# Patient Record
Sex: Female | Born: 1970 | Race: White | Hispanic: No | Marital: Married | Smoking: Never smoker
Health system: Southern US, Community
[De-identification: ages and names within clinical notes are randomized; demographics above are authoritative.]

## PROBLEM LIST (undated history)

## (undated) DIAGNOSIS — K219 Gastro-esophageal reflux disease without esophagitis: Secondary | ICD-10-CM

## (undated) DIAGNOSIS — M549 Dorsalgia, unspecified: Secondary | ICD-10-CM

## (undated) DIAGNOSIS — J069 Acute upper respiratory infection, unspecified: Secondary | ICD-10-CM

## (undated) DIAGNOSIS — E538 Deficiency of other specified B group vitamins: Secondary | ICD-10-CM

## (undated) DIAGNOSIS — E559 Vitamin D deficiency, unspecified: Secondary | ICD-10-CM

## (undated) DIAGNOSIS — R002 Palpitations: Secondary | ICD-10-CM

## (undated) DIAGNOSIS — I493 Ventricular premature depolarization: Secondary | ICD-10-CM

## (undated) DIAGNOSIS — C4491 Basal cell carcinoma of skin, unspecified: Secondary | ICD-10-CM

## (undated) DIAGNOSIS — D649 Anemia, unspecified: Secondary | ICD-10-CM

## (undated) DIAGNOSIS — F411 Generalized anxiety disorder: Secondary | ICD-10-CM

## (undated) DIAGNOSIS — M1990 Unspecified osteoarthritis, unspecified site: Secondary | ICD-10-CM

## (undated) DIAGNOSIS — K5900 Constipation, unspecified: Secondary | ICD-10-CM

## (undated) DIAGNOSIS — M2550 Pain in unspecified joint: Secondary | ICD-10-CM

## (undated) DIAGNOSIS — I10 Essential (primary) hypertension: Secondary | ICD-10-CM

## (undated) DIAGNOSIS — J309 Allergic rhinitis, unspecified: Secondary | ICD-10-CM

## (undated) DIAGNOSIS — E785 Hyperlipidemia, unspecified: Secondary | ICD-10-CM

## (undated) HISTORY — DX: Allergic rhinitis, unspecified: J30.9

## (undated) HISTORY — DX: Dorsalgia, unspecified: M54.9

## (undated) HISTORY — DX: Essential (primary) hypertension: I10

## (undated) HISTORY — PX: MOUTH SURGERY: SHX715

## (undated) HISTORY — DX: Unspecified osteoarthritis, unspecified site: M19.90

## (undated) HISTORY — DX: Vitamin D deficiency, unspecified: E55.9

## (undated) HISTORY — DX: Deficiency of other specified B group vitamins: E53.8

## (undated) HISTORY — DX: Constipation, unspecified: K59.00

## (undated) HISTORY — DX: Gastro-esophageal reflux disease without esophagitis: K21.9

## (undated) HISTORY — DX: Basal cell carcinoma of skin, unspecified: C44.91

## (undated) HISTORY — DX: Ventricular premature depolarization: I49.3

## (undated) HISTORY — DX: Palpitations: R00.2

## (undated) HISTORY — DX: Hyperlipidemia, unspecified: E78.5

## (undated) HISTORY — DX: Acute upper respiratory infection, unspecified: J06.9

## (undated) HISTORY — DX: Anemia, unspecified: D64.9

## (undated) HISTORY — DX: Pain in unspecified joint: M25.50

## (undated) HISTORY — DX: Generalized anxiety disorder: F41.1

---
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
## 1997-07-12 ENCOUNTER — Other Ambulatory Visit: Admission: RE | Admit: 1997-07-12 | Discharge: 1997-07-12 | Payer: Self-pay | Admitting: Obstetrics & Gynecology

## 1997-07-12 ENCOUNTER — Other Ambulatory Visit: Admission: RE | Admit: 1997-07-12 | Discharge: 1997-07-12 | Payer: Self-pay | Admitting: Gynecology

## 1997-08-03 ENCOUNTER — Encounter: Admission: RE | Admit: 1997-08-03 | Discharge: 1997-11-01 | Payer: Self-pay | Admitting: Internal Medicine

## 1998-07-17 ENCOUNTER — Other Ambulatory Visit: Admission: RE | Admit: 1998-07-17 | Discharge: 1998-07-17 | Payer: Self-pay | Admitting: Obstetrics & Gynecology

## 1998-10-31 ENCOUNTER — Other Ambulatory Visit: Admission: RE | Admit: 1998-10-31 | Discharge: 1998-10-31 | Payer: Self-pay | Admitting: Obstetrics & Gynecology

## 1999-07-10 ENCOUNTER — Other Ambulatory Visit: Admission: RE | Admit: 1999-07-10 | Discharge: 1999-07-10 | Payer: Self-pay | Admitting: Obstetrics & Gynecology

## 2000-03-09 ENCOUNTER — Inpatient Hospital Stay (HOSPITAL_COMMUNITY): Admission: AD | Admit: 2000-03-09 | Discharge: 2000-03-09 | Payer: Self-pay | Admitting: Family Medicine

## 2000-03-10 ENCOUNTER — Inpatient Hospital Stay (HOSPITAL_COMMUNITY): Admission: AD | Admit: 2000-03-10 | Discharge: 2000-03-12 | Payer: Self-pay | Admitting: Obstetrics & Gynecology

## 2000-03-10 ENCOUNTER — Encounter (INDEPENDENT_AMBULATORY_CARE_PROVIDER_SITE_OTHER): Payer: Self-pay

## 2000-03-13 ENCOUNTER — Encounter: Admission: RE | Admit: 2000-03-13 | Discharge: 2000-06-11 | Payer: Self-pay | Admitting: Obstetrics & Gynecology

## 2000-07-11 ENCOUNTER — Other Ambulatory Visit: Admission: RE | Admit: 2000-07-11 | Discharge: 2000-07-11 | Payer: Self-pay | Admitting: Obstetrics & Gynecology

## 2001-07-17 ENCOUNTER — Other Ambulatory Visit: Admission: RE | Admit: 2001-07-17 | Discharge: 2001-07-17 | Payer: Self-pay | Admitting: Obstetrics & Gynecology

## 2002-07-25 ENCOUNTER — Inpatient Hospital Stay (HOSPITAL_COMMUNITY): Admission: AD | Admit: 2002-07-25 | Discharge: 2002-07-25 | Payer: Self-pay | Admitting: Obstetrics & Gynecology

## 2002-10-06 ENCOUNTER — Inpatient Hospital Stay (HOSPITAL_COMMUNITY): Admission: AD | Admit: 2002-10-06 | Discharge: 2002-10-08 | Payer: Self-pay | Admitting: Obstetrics & Gynecology

## 2002-10-06 ENCOUNTER — Encounter (INDEPENDENT_AMBULATORY_CARE_PROVIDER_SITE_OTHER): Payer: Self-pay | Admitting: *Deleted

## 2002-10-16 ENCOUNTER — Inpatient Hospital Stay (HOSPITAL_COMMUNITY): Admission: AD | Admit: 2002-10-16 | Discharge: 2002-10-17 | Payer: Self-pay | Admitting: Obstetrics and Gynecology

## 2002-11-10 ENCOUNTER — Other Ambulatory Visit: Admission: RE | Admit: 2002-11-10 | Discharge: 2002-11-10 | Payer: Self-pay | Admitting: Obstetrics & Gynecology

## 2003-04-06 ENCOUNTER — Other Ambulatory Visit: Admission: RE | Admit: 2003-04-06 | Discharge: 2003-04-06 | Payer: Self-pay | Admitting: Obstetrics & Gynecology

## 2004-01-11 ENCOUNTER — Other Ambulatory Visit: Admission: RE | Admit: 2004-01-11 | Discharge: 2004-01-11 | Payer: Self-pay | Admitting: Obstetrics & Gynecology

## 2004-02-23 ENCOUNTER — Ambulatory Visit: Payer: Self-pay | Admitting: Internal Medicine

## 2004-04-16 ENCOUNTER — Ambulatory Visit: Payer: Self-pay | Admitting: Internal Medicine

## 2004-04-24 ENCOUNTER — Encounter: Admission: RE | Admit: 2004-04-24 | Discharge: 2004-04-24 | Payer: Self-pay | Admitting: Internal Medicine

## 2005-01-22 ENCOUNTER — Other Ambulatory Visit: Admission: RE | Admit: 2005-01-22 | Discharge: 2005-01-22 | Payer: Self-pay | Admitting: Obstetrics & Gynecology

## 2005-02-11 ENCOUNTER — Ambulatory Visit: Payer: Self-pay | Admitting: Internal Medicine

## 2005-10-28 ENCOUNTER — Ambulatory Visit: Payer: Self-pay | Admitting: Internal Medicine

## 2006-02-18 ENCOUNTER — Ambulatory Visit: Payer: Self-pay | Admitting: Internal Medicine

## 2007-03-17 ENCOUNTER — Ambulatory Visit: Payer: Self-pay | Admitting: Internal Medicine

## 2007-03-17 DIAGNOSIS — F411 Generalized anxiety disorder: Secondary | ICD-10-CM

## 2007-03-17 DIAGNOSIS — J309 Allergic rhinitis, unspecified: Secondary | ICD-10-CM

## 2007-03-17 HISTORY — DX: Allergic rhinitis, unspecified: J30.9

## 2007-03-17 HISTORY — DX: Generalized anxiety disorder: F41.1

## 2007-04-27 ENCOUNTER — Encounter: Payer: Self-pay | Admitting: Internal Medicine

## 2007-06-15 ENCOUNTER — Ambulatory Visit: Payer: Self-pay | Admitting: Endocrinology

## 2008-01-13 ENCOUNTER — Encounter: Payer: Self-pay | Admitting: Internal Medicine

## 2008-04-13 ENCOUNTER — Encounter: Payer: Self-pay | Admitting: Internal Medicine

## 2008-07-29 ENCOUNTER — Ambulatory Visit: Payer: Self-pay | Admitting: Internal Medicine

## 2008-09-02 ENCOUNTER — Ambulatory Visit: Payer: Self-pay | Admitting: Internal Medicine

## 2008-11-21 ENCOUNTER — Encounter: Payer: Self-pay | Admitting: Internal Medicine

## 2008-11-30 ENCOUNTER — Ambulatory Visit: Payer: Self-pay | Admitting: Internal Medicine

## 2008-12-16 ENCOUNTER — Encounter: Payer: Self-pay | Admitting: Internal Medicine

## 2009-05-23 ENCOUNTER — Ambulatory Visit: Payer: Self-pay | Admitting: Internal Medicine

## 2009-07-19 ENCOUNTER — Ambulatory Visit: Payer: Self-pay | Admitting: Internal Medicine

## 2009-07-19 DIAGNOSIS — J069 Acute upper respiratory infection, unspecified: Secondary | ICD-10-CM

## 2009-07-19 HISTORY — DX: Acute upper respiratory infection, unspecified: J06.9

## 2009-07-19 LAB — CONVERTED CEMR LAB: Rapid Strep: NEGATIVE

## 2010-05-08 NOTE — Assessment & Plan Note (Signed)
Summary: SINUS/NWS   Vital Signs:  Patient profile:   40 year old female Height:      66 inches Weight:      191.50 pounds BMI:     31.02 O2 Sat:      98 % on 75Room air Temp:     98.3 degrees F oral Pulse rate:   75 / minute BP sitting:   122 / 70  (left arm) Cuff size:   regular  Vitals Entered ByZella Ball Ewing (May 23, 2009 3:07 PM)  O2 Flow:  75Room air CC: sinus congestion, cough, head pressure/RE   Primary Care Provider:  Corwin Levins MD  CC:  sinus congestion, cough, and head pressure/RE.  History of Present Illness: here with acute osnet 2 to 3 days facial pain, pressure, fever and greenish d/c ; some slight ST, but Pt denies CP, sob, doe, wheezing, orthopnea, pnd, worsening LE edema, palps, dizziness or syncope   Pt denies new neuro symptoms such as headache, facial or extremity weakness  Overall good compliance with meds, no signficiant recent nasal allergy symptoms.    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 One By Mouth Two Times A Day As Needed For Cough  Current Medications (verified): 1)  Xyzal 5 Mg Tabs (Levocetirizine Dihydrochloride) 2)  Tussicaps 10-8 Mg Xr12h-Cap (Hydrocod Polst-Chlorphen Polst) .... One By Mouth Two Times A Day As Needed For Cough 3)  Clarithromycin 500 Mg Tabs (Clarithromycin) .Marland Kitchen.. 1 By Mouth Two Times A Day  Allergies (verified): No Known Drug Allergies  Past History:  Past Medical History: Last updated: 03/17/2007 Allergic rhinitis Anxiety  Past Surgical History: Last updated: 03/17/2007 Denies surgical history  Social History: Last updated: 11/30/2008 Never Smoked Alcohol use-no Married Drug use-no Regular exercise-yes  Risk  Factors: Exercise: yes (11/30/2008)  Risk Factors: Smoking Status: never (03/17/2007)  Review of Systems       all otherwise negative per pt   Physical Exam  General:  alert and well-developed.  , mild ill  Head:  normocephalic and atraumatic.   Eyes:  vision grossly intact, pupils equal, and pupils round.   Ears:  bilat tm's red, sinus tender bilat Nose:  nasal dischargemucosal pallor and mucosal edema.   Mouth:  pharyngeal erythema and fair dentition.   Neck:  supple and cervical lymphadenopathy.   Lungs:  normal respiratory effort and normal breath sounds.   Heart:  normal rate and regular rhythm.   Extremities:  no edema, no erythema    Impression & Recommendations:  Problem # 1:  ACUTE SINUSITIS, UNSPECIFIED (ICD-461.9)  Her updated medication list for this problem includes:    Tussicaps 10-8 Mg Xr12h-cap (Hydrocod polst-chlorphen polst) ..... One by mouth two times a day as needed for cough    Clarithromycin 500 Mg Tabs (Clarithromycin) .Marland Kitchen... 1 by mouth two times a day treat as above, f/u any worsening signs or symptoms   Complete Medication List: 1)  Xyzal 5 Mg Tabs (Levocetirizine dihydrochloride) 2)  Tussicaps 10-8 Mg Xr12h-cap (Hydrocod polst-chlorphen polst) .... One by mouth two times a day as needed for cough 3)  Clarithromycin 500 Mg Tabs (Clarithromycin) .Marland Kitchen.. 1 by mouth two times a day  Patient Instructions: 1)  Please take all new medications  as prescribed 2)  Continue all previous medications as before this visit  3)  You can also use Mucinex OTC or it's generic for congestion  4)  Please schedule a follow-up appointment as needed. Prescriptions: CLARITHROMYCIN 500 MG TABS (CLARITHROMYCIN) 1 by mouth two times a day  #20 x 0   Entered and Authorized by:   Corwin Levins MD   Signed by:   Corwin Levins MD on 05/23/2009   Method used:   Print then Give to Patient   RxID:   [id number]

## 2010-05-08 NOTE — Consult Note (Signed)
[facility]: [facility]   Imported By: Esmeralda Links D'jimraou 05/04/2007 11:42:54  _____________________________________________________________________  External Attachment:    Type:   Image     Comment:   External Document

## 2010-05-08 NOTE — Assessment & Plan Note (Signed)
Summary: DR Sammuel Cooper PT/NEED EARLY AM APPT-[facility]ANS FROM [facility]OS[facility] @ 11 TOD.Marland KitchenMarland Kitchen   Vital Signs:  Patient profile:   40 year old female Height:      66 inches Weight:      192 pounds BMI:     31.10 O2 Sat:      96 % on Room air Temp:     98.3 degrees F oral Pulse rate:   68 / minute Pulse rhythm:   regular Resp:     16 per minute BP sitting:   108 / 62  (left arm) Cuff size:   large  Vitals Entered By: Rock Nephew CMA (July 19, 2009 8:31 AM)  Nutrition Counseling: Patient's BMI is greater than 25 and therefore counseled on weight management options.  O2 Flow:  Room air CC: sore throat x several days, URI symptoms Is Patient Diabetic? No Pain Assessment Patient in pain? yes     Location: throat   Primary Care Provider:  Corwin Levins MD  CC:  sore throat x several days and URI symptoms.  History of Present Illness:  URI Symptoms      This is a 40 year old woman who presents with URI symptoms.  The symptoms began 3 days ago.  The severity is described as mild.  The patient reports clear nasal discharge, sore throat, earache, and sick contacts, but denies nasal congestion, dry cough, and productive cough.  The patient denies fever, stiff neck, dyspnea, wheezing, rash, vomiting, diarrhea, use of an antipyretic, and response to antipyretic.  The patient also reports seasonal symptoms and response to antihistamine.  The patient denies headache, muscle aches, and severe fatigue.  Risk factors for Strep sinusitis include absence of cough.  The patient denies the following risk factors for Strep sinusitis: unilateral facial pain, unilateral nasal discharge, poor response to decongestant, Strep exposure, and tender adenopathy.    Current Medications (verified): 1)  Xyzal 5 Mg Tabs (Levocetirizine Dihydrochloride)  Allergies (verified): No Known Drug Allergies  Past History:  Past Medical History: Reviewed history from 03/17/2007 and no changes required. Allergic  rhinitis Anxiety  Past Surgical History: Reviewed history from 03/17/2007 and no changes required. Denies surgical history  Social History: Reviewed history from 11/30/2008 and no changes required. Never Smoked Alcohol use-no Married Drug use-no Regular exercise-yes  Review of Systems  The patient denies anorexia, fever, decreased hearing, hoarseness, prolonged cough, hemoptysis, abdominal pain, hematuria, suspicious skin lesions, enlarged lymph nodes, and angioedema.    Physical Exam  General:  alert, well-developed, well-nourished, well-hydrated, appropriate dress, normal appearance, healthy-appearing, and cooperative to examination.   Head:  normocephalic, atraumatic, no abnormalities observed, and no abnormalities palpated.   Eyes:  No corneal or conjunctival inflammation noted. EOMI. Perrla. Funduscopic exam benign, without hemorrhages, exudates or papilledema. Vision grossly normal. Ears:  R ear normal and L ear normal.   Nose:  External nasal examination shows no deformity or inflammation. Nasal mucosa are pink and moist without lesions or exudates. Mouth:  good dentition, pharynx pink and moist, no erythema, no exudates, no posterior lymphoid hypertrophy, no postnasal drip, no pharyngeal crowing, no lesions, no aphthous ulcers, no erosions, no tongue abnormalities, and no leukoplakia.   Neck:  supple, full ROM, no masses, no thyromegaly, no cervical lymphadenopathy, and no neck tenderness.   Lungs:  Normal respiratory effort, chest expands symmetrically. Lungs are clear to auscultation, no crackles or wheezes. Heart:  Normal rate and regular rhythm. S1 and S2 normal without gallop, murmur, click, rub or  other extra sounds. Abdomen:  soft, non-tender, normal bowel sounds, no distention, no masses, no guarding, no rigidity, no hepatomegaly, and no splenomegaly.   Msk:  No deformity or scoliosis noted of thoracic or lumbar spine.   Pulses:  R and L carotid,radial,femoral,dorsalis  pedis and posterior tibial pulses are full and equal bilaterally Extremities:  No clubbing, cyanosis, edema, or deformity noted with normal full range of motion of all joints.   Neurologic:  No cranial nerve deficits noted. Station and gait are normal. Plantar reflexes are down-going bilaterally. DTRs are symmetrical throughout. Sensory, motor and coordinative functions appear intact. Skin:  turgor normal, color normal, no rashes, no suspicious lesions, no ecchymoses, no petechiae, no purpura, no ulcerations, and no edema.   Cervical Nodes:  no anterior cervical adenopathy and no posterior cervical adenopathy.   Axillary Nodes:  no R axillary adenopathy and no L axillary adenopathy.   Inguinal Nodes:  no R inguinal adenopathy and no L inguinal adenopathy.   Psych:  Cognition and judgment appear intact. Alert and cooperative with normal attention span and concentration. No apparent delusions, illusions, hallucinations   Impression & Recommendations:  Problem # 1:  URI (ICD-465.9) Assessment New  this sounds viral so no antibiotics were given The following medications were removed from the medication list:    Tussicaps 10-8 Mg Xr12h-cap (Hydrocod polst-chlorphen polst) ..... One by mouth two times a day as needed for cough Her updated medication list for this problem includes:    Xyzal 5 Mg Tabs (Levocetirizine dihydrochloride)  Orders: Rapid Strep (16109)  Complete Medication List: 1)  Xyzal 5 Mg Tabs (Levocetirizine dihydrochloride)  Patient Instructions: 1)  Please schedule a follow-up appointment in 2 weeks. 2)  Get plenty of rest, drink lots of clear liquids, and use Tylenol or Ibuprofen for fever and comfort. Return in 7-10 days if you're not better:sooner if you're feeling worse.  Laboratory Results    Other Tests  Rapid Strep: negative

## 2010-05-27 ENCOUNTER — Encounter: Payer: Self-pay | Admitting: Internal Medicine

## 2010-06-05 NOTE — Letter (Signed)
Summary: [facility]   Imported By: Lester Jefferson Davis 05/29/2010 09:34:10  _____________________________________________________________________  External Attachment:    Type:   Image     Comment:   External Document

## 2010-08-24 NOTE — Discharge Summary (Signed)
[facility]  Patient:    Hannah Cain, Hannah Cain                         MRN: [id number] Adm. Date:  [id number] Disc. Date: [id number] Attending:  Lars Pinks Dictator:   Leilani Able, P.A.                           Discharge Summary  FINAL DIAGNOSES:              1. Spontaneous vaginal delivery of a female                                  infant with Apgars of 8 and 9 over a second                                  degree perineal laceration.                               2. Prolonged third stage of labor.                               3. Retained products of conception.  PROCEDURES:                   1. Normal spontaneous vaginal delivery.                               2. Manual extraction of placenta.                               3. Endometrial curettage.  SURGEON:                      Brook A. Edward Jolly, M.D.  COMPLICATIONS:                None.  HOSPITAL COURSE:              This 40 year old, G1, P0, presents at 42 weeks for induction of labor secondary to post date status.  AROM was performed and Pitocin augmentation was begun.  The patient proceeded to dilate to complete/complete and had a spontaneous vaginal delivery of a 10 pound 2 ounce female infant with Apgars of 8 and 9 over a second degree perineal laceration. The infant was delivered without complications.  At this point, the patient had a prolonged third state of labor and the recommendation was made to proceed with manual extraction of placenta.  At this point, a gloved hand was inserted through the uterine cervix to the top of the level of the fundus. The placenta was sheared from its attachment site and was manually extracted. A repeat manual examination documented the presence of membrane and retained products in the anterior fundal region.  This was removed manually.  In addition, the angiocuret was placed through the cervix to get the remaining material.  The endometrium was  gently curetted for good removal of the remaining products of conception.  A repeat exam was performed to confirm the absence of  products of conception in the uterine cavity.  This procedure went without complication.  The patients postpartum course was complicated by some low-grade temperatures.  The patient was started on Cefotan following delivery and eventually became afebrile.  DISPOSITION:                  She was felt ready for discharge on postpartum day #2.  DIET:                         She was sent home on a regular diet.  ACTIVITY:                     Told to decrease activities.  DISCHARGE MEDICATIONS:        Told to continue prenatal vitamins and iron. The patient did have a hemoglobin of 7.2.  She was also told to use over-the-counter pain medicines.  FOLLOW-UP:                    To follow up in the office in four weeks. DD:  04/17/00 TD:  04/17/00 Job: [id number]

## 2010-08-24 NOTE — Op Note (Signed)
[facility] [place]  Patient:    Hannah Cain, Hannah Cain                         MRN: [id number] Proc. Date: 03/10/00 Adm. Date:  [id number] Attending:  Lars Pinks                           Operative Report  A banjo curette was used to remove the remaining products of conception from the anterior fundus.  A repeat manual examination following this documented the absence of any remaining products of conception within the uterine cavity.  With a Foley catheter which had been previously placed inside the urinary bladder prior to the manual extraction, the periurethral and the perineal areas were next evaluated.  They had been previously injected with 1% lidocaine.  The periurethral laceration was closed with interrupted sutures of 3-0 chromic.  The second degree laceration was repaired in standard fashion with both 0 and 2-0 Vicryl.  The patient did receive Pitocin 20 units IV after the placenta had been delivered.  She had excellent contraction of the uterus following delivery of the placenta.  There were no complications to the manual extraction and endometrial curettage.  All sponge, needle and instrument counts were correct. DD:  03/11/00 TD:  03/11/00 Job: [id number]

## 2010-08-24 NOTE — Op Note (Signed)
[facility] [place]  Patient:    Hannah Cain, Hannah Cain                         MRN: [id number] Proc. Date: 03/10/00 Adm. Date:  [id number] Attending:  Lars Pinks                           Operative Report  PREOPERATIVE DIAGNOSIS:       1. Prolonged third stage of labor.                               2. Retained products of conception.  POSTOPERATIVE DIAGNOSIS:      1. Prolonged third stage of labor.                               2. Retained products of conception.  OPERATION:                    Normal spontaneous vaginal delivery, followed by manual extraction of the placenta and endometrial curettage.  ANESTHESIA:                   Epidural, local with 1% lidocaine.  ESTIMATED BLOOD LOSS:         700 cc.  URINE OUTPUT:                 Approximately 250 cc.  COMPLICATIONS:                None.  INDICATIONS:                  The patient was a 40 year old gravida 1, para 0, admitted at [redacted] weeks gestation for induction of labor due to her postdates status.  The patient underwent artificial rupture of membranes when the cervix was 3 cm dilated and 50% effaced with the vertex at a -3 station.  Pitocin augmentation of labor was begun thereafter, as the patient was having inadequate labor.  An IUPC was placed to assist in augmentation of the patients labor.  She did receive an epidural per anesthesia.  The patient proceeded to complete cervical dilatation and delivered by a spontaneous vaginal delivery in the occiput-posterior position over a second-degree laceration.  The Apgars were 8 at one minute and 9 at five minutes.  The weight was noted to be 10 pounds 2 ounces.  The posterior shoulder and hand delivered prior to the anterior shoulder.  The infant was noted to be vigorous at birth.  The patient was noted to have a prolonged third stage of her labor and a recommendation was made to proceed with a manual extraction of the placenta.  After the risks  and benefits were reviewed with the patient, she chose to proceed.  DESCRIPTION OF PROCEDURE:     The patients epidural was dosed at the bedside, and when she was noted to have adequate anesthesia, a gloved hand was inserted through the uterine cervix and to the top of the level of the fundus.  With a hand placed abdominally sterilely over the drape covering the patients abdomen and fundus, the placenta was sheared from its attachment site to the uterine fundus, and it was manually extracted.  A repeat manual examination documented the presence of membranes and retained products in the anterior  fundal region.  This was partially removed manually and, in addition, a banjo curette was placed through the cervix and to the region where the remaining material was noted.  The endometrium was then gently curetted for good removal of the remaining products of conception.  The placenta was sent to pathology. A repeat manual examination at this time confirmed the absence of any further products of conception in the uterine cavity. DD:  03/11/00 TD:  03/11/00 Job: [id number]

## 2010-12-04 ENCOUNTER — Encounter: Payer: Self-pay | Admitting: Internal Medicine

## 2010-12-04 ENCOUNTER — Ambulatory Visit (INDEPENDENT_AMBULATORY_CARE_PROVIDER_SITE_OTHER): Payer: BC Managed Care – PPO | Admitting: Internal Medicine

## 2010-12-04 ENCOUNTER — Ambulatory Visit (INDEPENDENT_AMBULATORY_CARE_PROVIDER_SITE_OTHER)
Admission: RE | Admit: 2010-12-04 | Discharge: 2010-12-04 | Disposition: A | Discharge status: Home / Self Care | Payer: BC Managed Care – PPO | Source: Ambulatory Visit | Attending: Internal Medicine | Admitting: Internal Medicine

## 2010-12-04 VITALS — BP 102/72 | HR 73 | Temp 98.4°F | Ht 67.0 in | Wt 199.4 lb

## 2010-12-04 DIAGNOSIS — J029 Acute pharyngitis, unspecified: Secondary | ICD-10-CM | POA: Insufficient documentation

## 2010-12-04 DIAGNOSIS — F411 Generalized anxiety disorder: Secondary | ICD-10-CM

## 2010-12-04 DIAGNOSIS — R070 Pain in throat: Secondary | ICD-10-CM

## 2010-12-04 DIAGNOSIS — J309 Allergic rhinitis, unspecified: Secondary | ICD-10-CM

## 2010-12-04 DIAGNOSIS — Z0000 Encounter for general adult medical examination without abnormal findings: Secondary | ICD-10-CM

## 2010-12-04 MED ORDER — SULFAMETHOXAZOLE-TRIMETHOPRIM 800-160 MG PO TABS: 1.0000 | ORAL_TABLET | Freq: Two times a day (BID) | ORAL | Status: AC

## 2010-12-04 MED ORDER — HYDROCODONE-ACETAMINOPHEN 5-325 MG PO TABS: 1.0000 | ORAL_TABLET | ORAL | Status: AC | PRN

## 2010-12-04 NOTE — Progress Notes (Signed)
  Subjective:    Patient ID: Hannah Cain, female    DOB: 07-06-1970, 40 y.o.   MRN: [id number]  HPI  Here unfortunately with severe ST, feverish, and general weakness and malasise which has seemed to get progressively worse over the last 10 days with OTC med use , and even a zpack per urgent care, as well as tylenol #3.  Has increased chest congsetion and mild prod cough , not sure of color, Pt denies chest pain, increased sob or doe, wheezing, orthopnea, PND, increased LE swelling, palpitations, dizziness or syncope.  Pt denies new neurological symptoms such as new headache, or facial or extremity weakness or numbness   Pt denies polydipsia, polyuria.   Denies worsening depressive symptoms, suicidal ideation, or panic, though has ongoing anxiety, not increased recently.  Allergies have also not been a problem recently as far as nasal congestion, itch, sneeze, wheeze. Past Medical History  Diagnosis Date  . ALLERGIC RHINITIS 03/17/2007  . ANXIETY 03/17/2007  . URI 07/19/2009   No past surgical history on file.  reports that she has never smoked. She does not have any smokeless tobacco history on file. She reports that she does not drink alcohol or use illicit drugs. family history is not on file. No Known Allergies No current outpatient prescriptions on file prior to visit.   Review of Systems Review of Systems  Constitutional: Negative for diaphoresis and unexpected weight change.  HENT: Negative for drooling and tinnitus.   Eyes: Negative for photophobia and visual disturbance.  Respiratory: Negative for choking and stridor.   Gastrointestinal: Negative for vomiting and blood in stool.  Genitourinary: Negative for hematuria and decreased urine volume.     Objective:   Physical Exam BP 102/72  Pulse 73  Temp(Src) 98.4 F (36.9 C) (Oral)  Ht 5\' 7"  (1.702 m)  Wt 199 lb 6 oz (90.436 kg)  BMI 31.23 kg/m2  SpO2 96%  LMP 11/21/2010 Physical Exam  VS noted, mildl ill Constitutional: Pt  appears well-developed and well-nourished.  HENT: Head: Normocephalic.  Right Ear: External ear normal.  Left Ear: External ear normal.  Bilat tm's mild erythema.  Sinus nontender.  Pharynx mod to severe erythema, mild bilat tonsillar swelling, no abscess or exudate, drainage noted Eyes: Conjunctivae and EOM are normal. Pupils are equal, round, and reactive to light.  Neck: Normal range of motion. Neck supple. but with left > right submandib extensive LA Cardiovascular: Normal rate and regular rhythm.   Pulmonary/Chest: Effort normal and breath sounds normal.  Abd:  Soft, NT, non-distended, + BS,. No HSM Neurological: Pt is alert. No cranial nerve deficit.  Skin: Skin is warm. No erythema.  Psychiatric: Pt behavior is normal. Thought content normal. 1+ nervous, not depressed appearing        Assessment & Plan:

## 2010-12-04 NOTE — Assessment & Plan Note (Signed)
stable overall by hx and exam, , and pt to continue medical treatment as before   

## 2010-12-04 NOTE — Patient Instructions (Signed)
Please stop the zpack and tylenol #3 Take all new medications as prescribed - the antibiotic, pain medication Please call in 2-3 days if not improved to consider ENT referral Please go to XRAY in the Basement for the x-ray test Please call the phone number [phone] (the [employer] System) for results of testing in 2-3 days;  When calling, simply dial the number, and when prompted enter the MRN number above (the Medical Record Number) and the # key, then the message should start. Please return in 9 mo with Lab testing done 3-5 days before

## 2010-12-04 NOTE — Assessment & Plan Note (Signed)
stable overall by hx and exam, and pt to continue medical treatment as before, declines need for further tx at this time

## 2011-02-19 ENCOUNTER — Ambulatory Visit: Payer: BC Managed Care – PPO | Admitting: Internal Medicine

## 2011-02-19 ENCOUNTER — Encounter: Payer: Self-pay | Admitting: Internal Medicine

## 2011-02-19 ENCOUNTER — Ambulatory Visit (INDEPENDENT_AMBULATORY_CARE_PROVIDER_SITE_OTHER): Payer: BC Managed Care – PPO | Admitting: Internal Medicine

## 2011-02-19 VITALS — BP 130/80 | HR 81 | Temp 99.3°F | Ht 67.0 in | Wt 188.0 lb

## 2011-02-19 DIAGNOSIS — J40 Bronchitis, not specified as acute or chronic: Secondary | ICD-10-CM

## 2011-02-19 DIAGNOSIS — J309 Allergic rhinitis, unspecified: Secondary | ICD-10-CM

## 2011-02-19 DIAGNOSIS — F411 Generalized anxiety disorder: Secondary | ICD-10-CM

## 2011-02-19 MED ORDER — LEVOFLOXACIN 500 MG PO TABS: 500.0000 mg | ORAL_TABLET | Freq: Every day | ORAL | Status: AC

## 2011-02-19 NOTE — Progress Notes (Signed)
  Subjective:    Patient ID: Hannah Cain, female    DOB: 1970/12/09, 40 y.o.   MRN: [id number]  HPI  Here with acute onset mild to mod 2-3 days ST, HA, general weakness and malaise, with prod cough greenish sputum, but Pt denies chest pain, increased sob or doe, wheezing, orthopnea, PND, increased LE swelling, palpitations, dizziness or syncope.  Pt denies new neurological symptoms such as new headache, or facial or extremity weakness or numbness   Pt denies polydipsia, polyuria.  Does have several wks ongoing nasal allergy symptoms with clear congestion, itch and sneeze, without fever, pain, ST, cough or wheezing, but better with otc antihist;s.  Denies worsening depressive symptoms, suicidal ideation, or panic.   Pt denies fever, wt loss, night sweats, loss of appetite, or other constitutional symptoms except for the above acute. Past Medical History  Diagnosis Date  . ALLERGIC RHINITIS 03/17/2007  . ANXIETY 03/17/2007  . URI 07/19/2009   No past surgical history on file.  reports that she has never smoked. She does not have any smokeless tobacco history on file. She reports that she does not drink alcohol or use illicit drugs. family history is not on file. No Known Allergies Current Outpatient Prescriptions on File Prior to Visit  Medication Sig Dispense Refill  . acetaminophen-codeine (TYLENOL #3) 300-30 MG per tablet Take 1 tablet by mouth every 4 (four) hours as needed.         Review of Systems Review of Systems  Constitutional: Negative for diaphoresis and unexpected weight change.  HENT: Negative for drooling and tinnitus.   Eyes: Negative for photophobia and visual disturbance.  Respiratory: Negative for choking and stridor.   Gastrointestinal: Negative for vomiting and blood in stool.  Genitourinary: Negative for hematuria and decreased urine volume.       Objective:   Physical Exam BP 130/80  Pulse 81  Temp(Src) 99.3 F (37.4 C) (Oral)  Ht 5\' 7"  (1.702 m)  Wt 188 lb (85.276  kg)  BMI 29.44 kg/m2  SpO2 98% Physical Exam  VS noted Constitutional: Pt appears well-developed and well-nourished.  HENT: Head: Normocephalic.  Right Ear: External ear normal.  Left Ear: External ear normal.  Bilat tm's mild erythema.  Sinus nontender.  Pharynx mild erythema Eyes: Conjunctivae and EOM are normal. Pupils are equal, round, and reactive to light.  Neck: Normal range of motion. Neck supple.  Cardiovascular: Normal rate and regular rhythm.   Pulmonary/Chest: Effort normal and breath sounds normal.  Neurological: Pt is alert. No cranial nerve deficit.  Skin: Skin is warm. No erythema.  Psychiatric: Pt behavior is normal. Thought content normal. not nervous or depressed affect    Assessment & Plan:

## 2011-02-19 NOTE — Assessment & Plan Note (Signed)
stable overall by hx and exam, and pt to continue medical treatment as before 

## 2011-02-19 NOTE — Patient Instructions (Signed)
Take all new medications as prescribed Continue all other medications as before You can also take Delsym OTC for cough, and/or Mucinex (or it's generic off brand) for congestion  

## 2011-02-19 NOTE — Assessment & Plan Note (Signed)
Mild to mod, for antibx course,  to f/u any worsening symptoms or concerns 

## 2011-02-19 NOTE — Assessment & Plan Note (Signed)
stable overall by hx and exam, , and pt to continue medical treatment as before   

## 2011-03-13 ENCOUNTER — Telehealth: Payer: Self-pay

## 2011-03-13 NOTE — Telephone Encounter (Signed)
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

## 2013-04-05 ENCOUNTER — Other Ambulatory Visit: Payer: Self-pay

## 2015-12-28 ENCOUNTER — Telehealth: Payer: Self-pay | Admitting: Internal Medicine

## 2015-12-28 NOTE — Telephone Encounter (Signed)
Pt request to reestablish with Dr. Jenny Reichmann. Last ov was 02/2011, she just want a cpe. Please advise.

## 2015-12-28 NOTE — Telephone Encounter (Signed)
Ok with me 

## 2015-12-29 ENCOUNTER — Encounter: Payer: Self-pay | Admitting: Internal Medicine

## 2015-12-29 NOTE — Telephone Encounter (Signed)
Left vm for pt to call back and make an appt.

## 2016-02-16 ENCOUNTER — Other Ambulatory Visit (INDEPENDENT_AMBULATORY_CARE_PROVIDER_SITE_OTHER): Payer: BC Managed Care – PPO

## 2016-02-16 ENCOUNTER — Ambulatory Visit (INDEPENDENT_AMBULATORY_CARE_PROVIDER_SITE_OTHER): Payer: BC Managed Care – PPO | Admitting: Internal Medicine

## 2016-02-16 ENCOUNTER — Encounter: Payer: Self-pay | Admitting: Internal Medicine

## 2016-02-16 VITALS — BP 138/76 | HR 88 | Temp 98.7°F | Resp 20 | Wt 224.0 lb

## 2016-02-16 DIAGNOSIS — M25561 Pain in right knee: Secondary | ICD-10-CM | POA: Diagnosis not present

## 2016-02-16 DIAGNOSIS — Z0001 Encounter for general adult medical examination with abnormal findings: Secondary | ICD-10-CM

## 2016-02-16 DIAGNOSIS — G8929 Other chronic pain: Secondary | ICD-10-CM

## 2016-02-16 DIAGNOSIS — Z800 Family history of malignant neoplasm of digestive organs: Secondary | ICD-10-CM | POA: Insufficient documentation

## 2016-02-16 LAB — BASIC METABOLIC PANEL
BUN: 13 mg/dL (ref 6–23)
CALCIUM: 9.6 mg/dL (ref 8.4–10.5)
CHLORIDE: 102 meq/L (ref 96–112)
CO2: 27 mEq/L (ref 19–32)
CREATININE: 0.77 mg/dL (ref 0.40–1.20)
GFR: 86.29 mL/min (ref >60.00)
Glucose, Bld: 86 mg/dL (ref 70–99)
Potassium: 3.8 mEq/L (ref 3.5–5.1)
Sodium: 139 mEq/L (ref 135–145)

## 2016-02-16 LAB — LIPID PANEL
CHOL/HDL RATIO: 3
Cholesterol: 187 mg/dL (ref 0–200)
HDL: 64.7 mg/dL (ref >39.00)
LDL CALC: 105 mg/dL — AB (ref 0–99)
NONHDL: 122.73
TRIGLYCERIDES: 87 mg/dL (ref 0.0–149.0)
VLDL: 17.4 mg/dL (ref 0.0–40.0)

## 2016-02-16 LAB — HEPATIC FUNCTION PANEL
ALT: 13 U/L (ref 0–35)
AST: 16 U/L (ref 0–37)
Albumin: 4.5 g/dL (ref 3.5–5.2)
Alkaline Phosphatase: 68 U/L (ref 39–117)
BILIRUBIN DIRECT: 0.1 mg/dL (ref 0.0–0.3)
BILIRUBIN TOTAL: 0.6 mg/dL (ref 0.2–1.2)
Total Protein: 8 g/dL (ref 6.0–8.3)

## 2016-02-16 LAB — CBC WITH DIFFERENTIAL/PLATELET
BASOS PCT: 0.1 % (ref 0.0–3.0)
Basophils Absolute: 0 10*3/uL (ref 0.0–0.1)
EOS ABS: 0.1 10*3/uL (ref 0.0–0.7)
EOS PCT: 1.8 % (ref 0.0–5.0)
HEMATOCRIT: 40.7 % (ref 36.0–46.0)
HEMOGLOBIN: 13.8 g/dL (ref 12.0–15.0)
LYMPHS PCT: 27.1 % (ref 12.0–46.0)
Lymphs Abs: 2 10*3/uL (ref 0.7–4.0)
MCHC: 34 g/dL (ref 30.0–36.0)
MCV: 86.8 fl (ref 78.0–100.0)
MONOS PCT: 10.2 % (ref 3.0–12.0)
Monocytes Absolute: 0.8 10*3/uL (ref 0.1–1.0)
NEUTROS ABS: 4.5 10*3/uL (ref 1.4–7.7)
Neutrophils Relative %: 60.8 % (ref 43.0–77.0)
PLATELETS: 242 10*3/uL (ref 150.0–400.0)
RBC: 4.69 Mil/uL (ref 3.87–5.11)
RDW: 13.6 % (ref 11.5–15.5)
WBC: 7.5 10*3/uL (ref 4.0–10.5)

## 2016-02-16 LAB — URINALYSIS, ROUTINE W REFLEX MICROSCOPIC
BILIRUBIN URINE: NEGATIVE
KETONES UR: NEGATIVE
Leukocytes, UA: NEGATIVE
NITRITE: NEGATIVE
SPECIFIC GRAVITY, URINE: 1.02 (ref 1.000–1.030)
Total Protein, Urine: NEGATIVE
URINE GLUCOSE: NEGATIVE
Urobilinogen, UA: 0.2 (ref 0.0–1.0)
pH: 5.5 (ref 5.0–8.0)

## 2016-02-16 LAB — TSH: TSH: 1.77 u[IU]/mL (ref 0.35–4.50)

## 2016-02-16 NOTE — Assessment & Plan Note (Addendum)
Etiology unclear but suspect persistent symptomatic cartilage meniscus tear, for tylenol prn, refer sport med for further eval and tx  In addition to the time spent performing CPE, I spent an additional 10 minutes face to face,in which greater than 50% of this time was spent in counseling and coordination of care for patient's illness as documented.

## 2016-02-16 NOTE — Assessment & Plan Note (Signed)
Brother with hx of colon ca, for referral GI for colonoscopy

## 2016-02-16 NOTE — Progress Notes (Signed)
Subjective:    Patient ID: Hannah Cain, female    DOB: [birth year], 45 y.o.   MRN: [id number]  HPI    Here for wellness and f/u;  Overall doing ok;  Pt denies Chest pain, worsening SOB, DOE, wheezing, orthopnea, PND, worsening LE edema, palpitations, dizziness or syncope.  Pt denies neurological change such as new headache, facial or extremity weakness.  Pt denies polydipsia, polyuria, or low sugar symptoms. Pt states overall good compliance with treatment and medications, good tolerability, and has been trying to follow appropriate diet.  Pt denies worsening depressive symptoms, suicidal ideation or panic. No fever, night sweats, wt loss, loss of appetite, or other constitutional symptoms.  Pt states good ability with ADL's, has low fall risk, home safety reviewed and adequate, no other significant changes in hearing or vision, and not active with exercise, currently to subacute right knee pain .  Has ongoing problems with right knee, was running on beach in April, used ice and advil, still having pain rubber band like acroess the top of the knee, will pop with twisting and turning, is worse with steps or squatting, has some tendency to want to give out, no falls, has some swelling most of the time.   Also, Brother with stage 4 colon CA at 66yo  Past Medical History:  Diagnosis Date  . ALLERGIC RHINITIS 03/17/2007  . ANXIETY 03/17/2007  . URI 07/19/2009   No past surgical history on file.  reports that she has never smoked. She does not have any smokeless tobacco history on file. She reports that she does not drink alcohol or use drugs. family history is not on file. No Known Allergies No current outpatient prescriptions on file prior to visit.   No current facility-administered medications on file prior to visit.     Review of Systems Constitutional: Negative for increased diaphoresis, or other activity, appetite or siginficant weight change other than noted HENT: Negative for worsening  hearing loss, ear pain, facial swelling, mouth sores and neck stiffness.   Eyes: Negative for other worsening pain, redness or visual disturbance.  Respiratory: Negative for choking or stridor Cardiovascular: Negative for other chest pain and palpitations.  Gastrointestinal: Negative for worsening diarrhea, blood in stool, or abdominal distention Genitourinary: Negative for hematuria, flank pain or change in urine volume.  Musculoskeletal: Negative for myalgias or other joint complaints.  Skin: Negative for other color change and wound or drainage.  Neurological: Negative for syncope and numbness. other than noted Hematological: Negative for adenopathy. or other swelling Psychiatric/Behavioral: Negative for hallucinations, SI, self-injury, decreased concentration or other worsening agitation.  All other system neg per pt    Objective:   Physical Exam BP 138/76   Pulse 88   Temp 98.7 F (37.1 C) (Oral)   Resp 20   Wt 224 lb (101.6 kg)   SpO2 98%   BMI 35.08 kg/m  VS noted, obese Constitutional: Pt is oriented to person, place, and time. Appears well-developed and well-nourished, in no significant distress Head: Normocephalic and atraumatic  Eyes: Conjunctivae and EOM are normal. Pupils are equal, round, and reactive to light Right Ear: External ear normal.  Left Ear: External ear normal Nose: Nose normal.  Mouth/Throat: Oropharynx is clear and moist  Neck: Normal range of motion. Neck supple. No JVD present. No tracheal deviation present or significant neck LA or mass Cardiovascular: Normal rate, regular rhythm, normal heart sounds and intact distal pulses.   Pulmonary/Chest: Effort normal and breath sounds without rales or  wheezing  Abdominal: Soft. Bowel sounds are normal. NT. No HSM  Musculoskeletal: Normal range of motion. Exhibits no edema Lymphadenopathy: Has no cervical adenopathy.  Neurological: Pt is alert and oriented to person, place, and time. Pt has normal reflexes.  No cranial nerve deficit. Motor grossly intact Skin: Skin is warm and dry. No rash noted or new ulcers Psychiatric:  Has normal mood and affect. Behavior is normal.  Right knee with trace to 1+ effusion, NT, FROM    Assessment & Plan:

## 2016-02-16 NOTE — Assessment & Plan Note (Signed)

## 2016-02-16 NOTE — Progress Notes (Signed)
Pre visit review using our clinic review tool, if applicable. No additional management support is needed unless otherwise documented below in the visit note. 

## 2016-02-16 NOTE — Patient Instructions (Signed)
Please continue all other medications as before, and refills have been done if requested.  Please have the pharmacy call with any other refills you may need.  Please continue your efforts at being more active, low cholesterol diet, and weight control.  You are otherwise up to date with prevention measures today.  Please keep your appointments with your specialists as you may have planned  You will be contacted regarding the referral for: colonoscopy, and Dr Smith/sport medicine in this office (or you can make an appt as you leave today)  Please go to the LAB in the Basement (turn left off the elevator) for the tests to be done today  You will be contacted by phone if any changes need to be made immediately.  Otherwise, you will receive a letter about your results with an explanation, but please check with MyChart first.  Please remember to sign up for MyChart if you have not done so, as this will be important to you in the future with finding out test results, communicating by private email, and scheduling acute appointments online when needed.  Please return in 1 year for your yearly visit, or sooner if needed, with Lab testing done 3-5 days before

## 2016-02-26 NOTE — Progress Notes (Signed)
[facility] [place], [place] [address] Phone: [phone] Subjective:    I'm seeing this patient by the request  of:  Cathlean Cower, MD   CC: Right knee pain  [id number]  Hannah Cain is a 45 y.o. female coming in with complaint of right knee pain. Patient states that this started approximately 7 months ago. Patient was running on the beach. Patient states that there is an audible pop. Seems to be worse now when twisting and turning motion. Worse with steps or squatting deeply. Patient states in sometimes it does feel like it wants to give out but she has not fallen. Some swelling. Patient states Continues and pain mostly on the anterior lateral aspect of the knees. Patient states that sometimes it feels unstable but very minimal. Rates the severity of pain as 5 out of 10. Still able to do daily activities. No nighttime awakening.     Past Medical History:  Diagnosis Date  . ALLERGIC RHINITIS 03/17/2007  . ANXIETY 03/17/2007  . URI 07/19/2009   No past surgical history on file. Social History   Social History  . Marital status: Married    Spouse name: N/A  . Number of children: N/A  . Years of education: N/A   Social History Main Topics  . Smoking status: Never Smoker  . Smokeless tobacco: None  . Alcohol use No  . Drug use: No  . Sexual activity: Not Asked   Other Topics Concern  . None   Social History Narrative  . None   No Known Allergies No family history on file. No family history of rheumatological diseases  Past medical history, social, surgical and family history all reviewed in electronic medical record.  No pertanent information unless stated regarding to the chief complaint.   Review of Systems:Review of systems updated and as accurate as of 02/27/16  No headache, visual changes, nausea, vomiting, diarrhea, constipation, dizziness, abdominal pain, skin rash, fevers, chills, night sweats, weight loss, swollen lymph  nodes, body aches, joint swelling, muscle aches, chest pain, shortness of breath, mood changes.   Objective  Blood pressure 128/82, pulse 88, height 5\' 7"  (1.702 m), weight 221 lb (100.2 kg), SpO2 99 %. Systems examined below as of 02/27/16   General: No apparent distress alert and oriented x3 mood and affect normal, dressed appropriately.  HEENT: Pupils equal, extraocular movements intact  Respiratory: Patient's speak in full sentences and does not appear short of breath  Cardiovascular: No lower extremity edema, non tender, no erythema  Skin: Warm dry intact with no signs of infection or rash on extremities or on axial skeleton.  Abdomen: Soft nontender  Neuro: Cranial nerves II through XII are intact, neurovascularly intact in all extremities with 2+ DTRs and 2+ pulses.  Lymph: No lymphadenopathy of posterior or anterior cervical chain or axillae bilaterally.  Gait normal with good balance and coordination.  MSK:  Non tender with full range of motion and good stability and symmetric strength and tone of shoulders, elbows, wrist, hip, and ankles bilaterally.  Knee: right  Normal to inspection with no erythema or effusion or obvious bony abnormalities. Mild lateral tilt of the patella on the right side compared to left side Palpation normal with no warmth, joint line tenderness, patellar tenderness, or condyle tenderness. ROM full in flexion and extension and lower leg rotation. Ligaments with solid consistent endpoints including ACL, PCL, LCL, MCL. Negative Mcmurray's, Apley's, and Thessalonian tests. painful patellar compression. Patellar glide  with mild crepitus. Patellar and quadriceps tendons unremarkable. Hamstring and quadriceps strength is normal.  Contralateral knee unremarkable  MSK US performed of: right  This study was ordered, performed, and interpreted by Charlann Boxer D.O.  Knee: All structures visualized. Anteromedial, anterolateral, posteromedial, and posterolateral  menisci unremarkable without tearing, fraying, effusion, or displacement. Mild narrowing of the medial joint line as well as narrowing of the patellofemoral joint.  IMPRESSION:  Patellofemoral mild arthritis    Impression and Recommendations:     This case required medical decision making of moderate complexity.      Note: This dictation was prepared with Dragon dictation along with smaller phrase technology. Any transcriptional errors that result from this process are unintentional.

## 2016-02-27 ENCOUNTER — Ambulatory Visit: Payer: Self-pay

## 2016-02-27 ENCOUNTER — Encounter: Payer: Self-pay | Admitting: Gastroenterology

## 2016-02-27 ENCOUNTER — Ambulatory Visit (INDEPENDENT_AMBULATORY_CARE_PROVIDER_SITE_OTHER): Payer: BC Managed Care – PPO | Admitting: Family Medicine

## 2016-02-27 ENCOUNTER — Encounter: Payer: Self-pay | Admitting: Family Medicine

## 2016-02-27 VITALS — BP 128/82 | HR 88 | Ht 67.0 in | Wt 221.0 lb

## 2016-02-27 DIAGNOSIS — M25561 Pain in right knee: Secondary | ICD-10-CM

## 2016-02-27 DIAGNOSIS — M222X1 Patellofemoral disorders, right knee: Secondary | ICD-10-CM | POA: Diagnosis not present

## 2016-02-27 NOTE — Assessment & Plan Note (Signed)
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 We discussed icing regimen, home exercises, which activities to do in which ones to avoid. Patient work with Product/process development scientist. Topical anti-inflammatory's prescribed. We discussed icing regimen. Continuing to stay active. Follow-up in 4 weeks.

## 2016-02-27 NOTE — Patient Instructions (Addendum)
Good to see you  Ice 20 minutes 2 times daily. Usually after activity and before bed. pennsaid pinkie amount topically 2 times daily as needed.  Exercises 3 times a week.  Vitamin D 2000 IU daily  Turmeric 500mg  twice daily  Avoid running for a short time and consider biking and elliptical.  See me again in 4 weeks to make sure we are on track.

## 2016-03-27 ENCOUNTER — Ambulatory Visit: Payer: BC Managed Care – PPO | Admitting: Family Medicine

## 2016-04-08 HISTORY — PX: MOHS SURGERY: SUR867

## 2016-04-08 LAB — HM COLONOSCOPY

## 2016-04-09 ENCOUNTER — Ambulatory Visit (INDEPENDENT_AMBULATORY_CARE_PROVIDER_SITE_OTHER): Payer: BC Managed Care – PPO | Admitting: Gastroenterology

## 2016-04-09 ENCOUNTER — Encounter: Payer: Self-pay | Admitting: Gastroenterology

## 2016-04-09 VITALS — BP 126/74 | HR 80 | Ht 66.75 in | Wt 223.4 lb

## 2016-04-09 DIAGNOSIS — K5900 Constipation, unspecified: Secondary | ICD-10-CM | POA: Diagnosis not present

## 2016-04-09 DIAGNOSIS — Z800 Family history of malignant neoplasm of digestive organs: Secondary | ICD-10-CM

## 2016-04-09 MED ORDER — NA SULFATE-K SULFATE-MG SULF 17.5-3.13-1.6 GM/177ML PO SOLN: ORAL | 0 refills | Status: DC

## 2016-04-09 NOTE — Progress Notes (Signed)
HPI: This is a  very pleasant 46 year old woman  who was referred to me by Biagio Borg, MD  to evaluate  family history of colon cancer, mild chronic constipation .    Chief complaint is family history of colon cancer, mild chronic constipation  Brother recently diagnosed with metastatic colon cancer.  Undergoing chemo; he is in his 72s. Apparently genetic testing was negative.  No other colon cancer in her family.  GM had breast cancer.  CBC, complete metabolic profile November 2017 were all normal  Tends to be constipated for many years.  Will have diarrhea shortly before her mentral cycle for a few years.  No blood in stool.  Has used colace periodically.  Has never tried fiber.  Overall weight up 20 pounds in several months.   Brother's genetic testing (I saw a copy on her phone): This was tested for Lynch syndrome genetic mutations and none were found.  Review of systems: Pertinent positive and negative review of systems were noted in the above HPI section. Complete review of systems was performed and was otherwise normal.   Past Medical History:  Diagnosis Date  . ALLERGIC RHINITIS 03/17/2007  . ANXIETY 03/17/2007  . Arthritis   . URI 07/19/2009    Past Surgical History:  Procedure Laterality Date  . BREAST BIOPSY Right 1990  . MOUTH SURGERY      Current Outpatient Prescriptions  Medication Sig Dispense Refill  . Cholecalciferol (VITAMIN D) 2000 units CAPS Take 1 capsule by mouth daily.    . Diclofenac Sodium (PENNSAID) 2 % SOLN Place 1 application onto the skin as needed.    . Turmeric 500 MG CAPS Take 1 tablet by mouth 2 (two) times daily.     No current facility-administered medications for this visit.     Allergies as of 04/09/2016  . (No Known Allergies)    Family History  Problem Relation Age of Onset  . Colon cancer Brother 36    mets to liver  . Liver cancer Brother   . Breast cancer Maternal Grandmother   . Colon polyps Maternal  Grandmother   . Stroke Maternal Grandmother   . Hypertension Maternal Grandmother   . Hyperlipidemia Maternal Grandmother     Social History   Social History  . Marital status: Married    Spouse name: N/A  . Number of children: 2  . Years of education: N/A   Occupational History  . educational diagnostician    Social History Main Topics  . Smoking status: Never Smoker  . Smokeless tobacco: Never Used  . Alcohol use No  . Drug use: No  . Sexual activity: Not on file   Other Topics Concern  . Not on file   Social History Narrative  . No narrative on file     Physical Exam: BP 126/74 (BP Location: Left Arm, Patient Position: Sitting, Cuff Size: Large)   Pulse 80   Ht 5' 6.75" (1.695 m) Comment: height measured without shoes  Wt 223 lb 6 oz (101.3 kg)   LMP 04/05/2016   BMI 35.25 kg/m  Constitutional: generally well-appearing Psychiatric: alert and oriented x3 Eyes: extraocular movements intact Mouth: oral pharynx moist, no lesions Neck: supple no lymphadenopathy Cardiovascular: heart regular rate and rhythm Lungs: clear to auscultation bilaterally Abdomen: soft, nontender, nondistended, no obvious ascites, no peritoneal signs, normal bowel sounds Extremities: no lower extremity edema bilaterally Skin: no lesions on visible extremities   Assessment and plan: 46 y.o. female with  Significant family  history of colon cancer, mild chronic functional constipation  For her mild chronic functional constipation recommended she try fiber supplements and try to stay more hydrated. Her brother was recently diagnosed with colon cancer, metastatic to liver in his 59s. I recommended she have screening colonoscopy at her soonest convenience. Since it he does not appear to have any known genetic mutations based on myriad genetics results, I think she will probably only need colonoscopy, screening every 5 years pending the results of her upcoming 1.   Owens Loffler, MD [facility] 04/09/2016, 10:43 AM  Cc: Biagio Borg, MD

## 2016-04-09 NOTE — Patient Instructions (Addendum)
Please start taking citrucel (orange flavored) powder fiber supplement.  This may cause some bloating at first but that usually goes away. Begin with a small spoonful and work your way up to a large, heaping spoonful daily over a week.  You have been scheduled for a colonoscopy. Please follow written instructions given to you at your visit today.  Please pick up your prep supplies at the pharmacy within the next 1-3 days. If you use inhalers (even only as needed), please bring them with you on the day of your procedure. Your physician has requested that you go to www.startemmi.com and enter the access code given to you at your visit today. This web site gives a general overview about your procedure. However, you should still follow specific instructions given to you by our office regarding your preparation for the procedure.  Your body mass index should be between 19-25. Your Body mass index is 35.25 kg/m. If this is out of the aformentioned range listed, please consider follow up with your Primary Care Provider.

## 2016-04-15 NOTE — Progress Notes (Signed)
[facility] [place], [place] [address] Phone: [phone] Subjective:    I'm seeing this patient by the request  of:  Cathlean Cower, MD   CC: Right knee pain f/u  [id number]  Hannah Cain is a 46 y.o. female coming in with complaint of right knee pain. Found to have more of a patellofemoral syndrome of the right knee. Patient was to start home exercises, icing protocol and topical anti-inflammatories. Patient states that she is 95% better. Feels like she has made significant progress. Happy with the results.    Past Medical History:  Diagnosis Date  . ALLERGIC RHINITIS 03/17/2007  . ANXIETY 03/17/2007  . Arthritis   . URI 07/19/2009   Past Surgical History:  Procedure Laterality Date  . BREAST BIOPSY Right 1990  . MOUTH SURGERY     Social History   Social History  . Marital status: Married    Spouse name: N/A  . Number of children: 2  . Years of education: N/A   Occupational History  . educational diagnostician    Social History Main Topics  . Smoking status: Never Smoker  . Smokeless tobacco: Never Used  . Alcohol use No  . Drug use: No  . Sexual activity: Not Asked   Other Topics Concern  . None   Social History Narrative  . None   No Known Allergies Family History  Problem Relation Age of Onset  . Colon cancer Brother 81    mets to liver  . Liver cancer Brother   . Breast cancer Maternal Grandmother   . Colon polyps Maternal Grandmother   . Stroke Maternal Grandmother   . Hypertension Maternal Grandmother   . Hyperlipidemia Maternal Grandmother    No family history of rheumatological diseases  Past medical history, social, surgical and family history all reviewed in electronic medical record.  No pertanent information unless stated regarding to the chief complaint.   Review of Systems: No headache, visual changes, nausea, vomiting, diarrhea, constipation, dizziness, abdominal pain, skin rash, fevers, chills,  night sweats, weight loss, swollen lymph nodes, body aches, joint swelling, muscle aches, chest pain, shortness of breath, mood changes.     Objective  Blood pressure 124/82, pulse 75, height 5\' 7"  (1.702 m), weight 225 lb (102.1 kg), last menstrual period 04/05/2016, SpO2 97 %.   Systems examined below as of 04/16/16 General: NAD A&O x3 mood, affect normal overweight HEENT: Pupils equal, extraocular movements intact no nystagmus Respiratory: not short of breath at rest or with speaking Cardiovascular: No lower extremity edema, non tender Skin: Warm dry intact with no signs of infection or rash on extremities or on axial skeleton. Abdomen: Soft nontender, no masses Neuro: Cranial nerves  intact, neurovascularly intact in all extremities with 2+ DTRs and 2+ pulses. Lymph: No lymphadenopathy appreciated today  Gait normal with good balance and coordination.  MSK: Non tender with full range of motion and good stability and symmetric strength and tone of shoulders, elbows, wrist,  hips and ankles bilaterally.  .  Knee: Right Mild lateral tilt of the patella Palpation normal with no warmth, joint line tenderness, patellar tenderness, or condyle tenderness. ROM full in flexion and extension and lower leg rotation. Ligaments with solid consistent endpoints including ACL, PCL, LCL, MCL. Negative Mcmurray's, Apley's, and Thessalonian tests. Non painful patellar compression. Patellar glide with mild crepitus. Patellar and quadriceps tendons unremarkable. Hamstring and quadriceps strength is normal.  Contralateral knee unremarkable Improving from previous exam  Impression and Recommendations:     This case required medical decision making of moderate complexity.      Note: This dictation was prepared with Dragon dictation along with smaller phrase technology. Any transcriptional errors that result from this process are unintentional.

## 2016-04-16 ENCOUNTER — Ambulatory Visit (INDEPENDENT_AMBULATORY_CARE_PROVIDER_SITE_OTHER): Payer: BC Managed Care – PPO | Admitting: Family Medicine

## 2016-04-16 ENCOUNTER — Encounter: Payer: Self-pay | Admitting: Family Medicine

## 2016-04-16 DIAGNOSIS — M222X1 Patellofemoral disorders, right knee: Secondary | ICD-10-CM | POA: Diagnosis not present

## 2016-04-16 NOTE — Assessment & Plan Note (Signed)
Continued improvement with conservative therapy. Change in management. Patient as well as she can follow-up as needed. Worse consider injection, x-rays, as well as potentially referral to formal physical therapy.

## 2016-04-22 ENCOUNTER — Encounter: Payer: Self-pay | Admitting: Family Medicine

## 2016-05-16 ENCOUNTER — Encounter: Payer: Self-pay | Admitting: Gastroenterology

## 2016-05-16 NOTE — Telephone Encounter (Signed)
Error

## 2016-05-20 ENCOUNTER — Encounter: Payer: Self-pay | Admitting: Gastroenterology

## 2016-05-31 ENCOUNTER — Ambulatory Visit (AMBULATORY_SURGERY_CENTER): Payer: BC Managed Care – PPO | Admitting: Gastroenterology

## 2016-05-31 ENCOUNTER — Encounter: Payer: Self-pay | Admitting: Gastroenterology

## 2016-05-31 ENCOUNTER — Encounter: Payer: Self-pay | Admitting: Internal Medicine

## 2016-05-31 VITALS — BP 134/52 | HR 74 | Temp 99.5°F | Resp 16 | Ht 66.75 in | Wt 223.0 lb

## 2016-05-31 DIAGNOSIS — Z800 Family history of malignant neoplasm of digestive organs: Secondary | ICD-10-CM

## 2016-05-31 DIAGNOSIS — Z1211 Encounter for screening for malignant neoplasm of colon: Secondary | ICD-10-CM | POA: Diagnosis not present

## 2016-05-31 DIAGNOSIS — Z1212 Encounter for screening for malignant neoplasm of rectum: Secondary | ICD-10-CM

## 2016-05-31 MED ORDER — SODIUM CHLORIDE 0.9 % IV SOLN: 500.0000 mL | INTRAVENOUS | Status: DC

## 2016-05-31 NOTE — Op Note (Signed)
[place] Patient Name: Hannah Cain Procedure Date: 05/31/2016 2:00 PM MRN: [id number] Endoscopist: Milus Banister , MD Age: 46 Referring MD:  Date of Birth: 09-Jan-1971 Gender: Female Account #: [id number] Procedure:                Colonoscopy Indications:              Screening in patient at increased risk: Family                            history of 1st-degree relative with colorectal                            cancer before age 6 years (brother with colon                            cancer, diagnosed in his 85s, genetic testing done                            and was neg) Medicines:                Monitored Anesthesia Care Procedure:                Pre-Anesthesia Assessment:                           - Prior to the procedure, a History and Physical                            was performed, and patient medications and                            allergies were reviewed. The patient's tolerance of                            previous anesthesia was also reviewed. The risks                            and benefits of the procedure and the sedation                            options and risks were discussed with the patient.                            All questions were answered, and informed consent                            was obtained. Prior Anticoagulants: The patient has                            taken no previous anticoagulant or antiplatelet                            agents. ASA Grade Assessment: II - A patient with  mild systemic disease. After reviewing the risks                            and benefits, the patient was deemed in                            satisfactory condition to undergo the procedure.                           After obtaining informed consent, the colonoscope                            was passed under direct vision. Throughout the                            procedure, the patient's blood pressure, pulse, and                    oxygen saturations were monitored continuously. The                            Model PCF-H190L [id number]) scope was introduced                            through the anus and advanced to the the cecum,                            identified by appendiceal orifice and ileocecal                            valve. The colonoscopy was performed without                            difficulty. The patient tolerated the procedure                            well. The quality of the bowel preparation was                            excellent. The appendiceal orifice, and rectum were                            photographed. The IC valve was well visualized but                            not photographed. Scope In: 2:14:43 PM Scope Out: 2:23:18 PM Scope Withdrawal Time: 0 hours 6 minutes 31 seconds  Total Procedure Duration: 0 hours 8 minutes 35 seconds  Findings:                 The entire examined colon appeared normal on direct                            and retroflexion views. Complications:            No immediate complications. Estimated blood  loss:                            None. Estimated Blood Loss:     Estimated blood loss: none. Impression:               - The entire examined colon is normal on direct and                            retroflexion views.                           - No specimens collected. Recommendation:           - Patient has a contact number available for                            emergencies. The signs and symptoms of potential                            delayed complications were discussed with the                            patient. Return to normal activities tomorrow.                            Written discharge instructions were provided to the                            patient.                           - Resume previous diet.                           - Continue present medications.                           - Repeat colonoscopy in 5 years for  screening                            purposes. Milus Banister, MD 05/31/2016 2:25:53 PM This report has been signed electronically.

## 2016-05-31 NOTE — Patient Instructions (Signed)
YOU HAD AN ENDOSCOPIC PROCEDURE TODAY AT THE [facility]:   Refer to the procedure report that was given to you for any specific questions about what was found during the examination.  If the procedure report does not answer your questions, please call your gastroenterologist to clarify.  If you requested that your care partner not be given the details of your procedure findings, then the procedure report has been included in a sealed envelope for you to review at your convenience later.  YOU SHOULD EXPECT: Some feelings of bloating in the abdomen. Passage of more gas than usual.  Walking can help get rid of the air that was put into your GI tract during the procedure and reduce the bloating. If you had a lower endoscopy (such as a colonoscopy or flexible sigmoidoscopy) you may notice spotting of blood in your stool or on the toilet paper. If you underwent a bowel prep for your procedure, you may not have a normal bowel movement for a few days.  Please Note:  You might notice some irritation and congestion in your nose or some drainage.  This is from the oxygen used during your procedure.  There is no need for concern and it should clear up in a day or so.  SYMPTOMS TO REPORT IMMEDIATELY:   Following lower endoscopy (colonoscopy or flexible sigmoidoscopy):  Excessive amounts of blood in the stool  Significant tenderness or worsening of abdominal pains  Swelling of the abdomen that is new, acute  Fever of 100F or higher  For urgent or emergent issues, a gastroenterologist can be reached at any hour by calling [phone].   DIET:  We do recommend a small meal at first, but then you may proceed to your regular diet.  Drink plenty of fluids but you should avoid alcoholic beverages for 24 hours.  ACTIVITY:  You should plan to take it easy for the rest of today and you should NOT DRIVE or use heavy machinery until tomorrow (because of the sedation medicines used during the test).     FOLLOW UP: Our staff will call the number listed on your records the next business day following your procedure to check on you and address any questions or concerns that you may have regarding the information given to you following your procedure. If we do not reach you, we will leave a message.  However, if you are feeling well and you are not experiencing any problems, there is no need to return our call.  We will assume that you have returned to your regular daily activities without incident.  If any biopsies were taken you will be contacted by phone or by letter within the next 1-3 weeks.  Please call us at [phone] if you have not heard about the biopsies in 3 weeks.    SIGNATURES/CONFIDENTIALITY: You and/or your care partner have signed paperwork which will be entered into your electronic medical record.  These signatures attest to the fact that that the information above on your After Visit Summary has been reviewed and is understood.  Full responsibility of the confidentiality of this discharge information lies with you and/or your care-partner. 

## 2016-05-31 NOTE — Progress Notes (Signed)
To recovery report to [employer]

## 2016-06-03 ENCOUNTER — Telehealth: Payer: Self-pay | Admitting: *Deleted

## 2016-06-03 NOTE — Telephone Encounter (Signed)
  Follow up Call-  Call back number 05/31/2016  Post procedure Call Back phone  # [phone]  Permission to leave phone message Yes  Some recent data might be hidden     Patient questions:  Do you have a fever, pain , or abdominal swelling? No. Pain Score  0 *  Have you tolerated food without any problems? Yes.    Have you been able to return to your normal activities? Yes.    Do you have any questions about your discharge instructions: Diet   No. Medications  No. Follow up visit  No.  Do you have questions or concerns about your Care? No.  Actions: * If pain score is 4 or above: No action needed, pain <4.

## 2016-07-02 ENCOUNTER — Ambulatory Visit (INDEPENDENT_AMBULATORY_CARE_PROVIDER_SITE_OTHER): Payer: BC Managed Care – PPO | Admitting: Internal Medicine

## 2016-07-02 ENCOUNTER — Encounter: Payer: Self-pay | Admitting: Internal Medicine

## 2016-07-02 VITALS — BP 140/86 | HR 82 | Temp 98.4°F | Resp 14 | Ht 66.75 in | Wt 230.0 lb

## 2016-07-02 DIAGNOSIS — R05 Cough: Secondary | ICD-10-CM | POA: Diagnosis not present

## 2016-07-02 DIAGNOSIS — Z20818 Contact with and (suspected) exposure to other bacterial communicable diseases: Secondary | ICD-10-CM | POA: Diagnosis not present

## 2016-07-02 DIAGNOSIS — R69 Illness, unspecified: Secondary | ICD-10-CM | POA: Diagnosis not present

## 2016-07-02 DIAGNOSIS — R059 Cough, unspecified: Secondary | ICD-10-CM | POA: Insufficient documentation

## 2016-07-02 DIAGNOSIS — J111 Influenza due to unidentified influenza virus with other respiratory manifestations: Secondary | ICD-10-CM | POA: Insufficient documentation

## 2016-07-02 MED ORDER — HYDROCODONE-HOMATROPINE 5-1.5 MG/5ML PO SYRP: 5.0000 mL | ORAL_SOLUTION | Freq: Four times a day (QID) | ORAL | 0 refills | Status: AC | PRN

## 2016-07-02 MED ORDER — OSELTAMIVIR PHOSPHATE 75 MG PO CAPS: 75.0000 mg | ORAL_CAPSULE | Freq: Two times a day (BID) | ORAL | 0 refills | Status: DC

## 2016-07-02 MED ORDER — AZITHROMYCIN 250 MG PO TABS: ORAL_TABLET | ORAL | 1 refills | Status: DC

## 2016-07-02 NOTE — Addendum Note (Signed)
Addended by: Biagio Borg on: 07/02/2016 09:10 PM   Modules accepted: Level of Service

## 2016-07-02 NOTE — Assessment & Plan Note (Signed)
Mild to mod, for antibx course,  to f/u any worsening symptoms or concerns 

## 2016-07-02 NOTE — Patient Instructions (Signed)
Please take all new medication as prescribed - the tamiflu, zpack, and cough medicine if needed  Please continue all other medications as before, and refills have been done if requested.  Please have the pharmacy call with any other refills you may need.  Please keep your appointments with your specialists as you may have planned

## 2016-07-02 NOTE — Progress Notes (Signed)
Pre visit review using our clinic review tool, if applicable. No additional management support is needed unless otherwise documented below in the visit note. 

## 2016-07-02 NOTE — Assessment & Plan Note (Signed)
Mild to mod, for tamiflu course,  to f/u any worsening symptoms or concerns  

## 2016-07-02 NOTE — Progress Notes (Signed)
   Subjective:    Patient ID: Hannah Cain, female    DOB: [birth year], 46 y.o.   MRN: [id number]  HPI  Here with 3 days onset flu like illness with HA, feverish, general weakness, ST, cough, congestion but no n/v, diarrhea and Pt denies chest pain, increased sob or doe, wheezing, orthopnea, PND, increased LE swelling, palpitations, dizziness or syncope.  Daughter currently has proven strep and influenza on antibx. Pt denies polydipsia, polyuria Past Medical History:  Diagnosis Date  . ALLERGIC RHINITIS 03/17/2007  . ANXIETY 03/17/2007   DENIES 05/31/16  . Arthritis   . URI 07/19/2009   Past Surgical History:  Procedure Laterality Date  . BREAST BIOPSY Right 1990  . MOUTH SURGERY      reports that she has never smoked. She has never used smokeless tobacco. She reports that she does not drink alcohol or use drugs. family history includes Breast cancer in her maternal grandmother; COPD in her maternal grandmother; Colon cancer (age of onset: 48) in her brother; Colon polyps in her maternal grandmother; Heart failure in her maternal grandmother; Hyperlipidemia in her maternal grandmother; Hypertension in her maternal grandmother; Liver cancer in her brother; Stroke in her maternal grandmother. No Known Allergies Current Outpatient Prescriptions on File Prior to Visit  Medication Sig Dispense Refill  . Cholecalciferol (VITAMIN D) 2000 units CAPS Take 1 capsule by mouth daily.    . Diclofenac Sodium (PENNSAID) 2 % SOLN Place 1 application onto the skin as needed.    . Turmeric 500 MG CAPS Take 1 tablet by mouth 2 (two) times daily.     Current Facility-Administered Medications on File Prior to Visit  Medication Dose Route Frequency Provider Last Rate Last Dose  . 0.9 %  sodium chloride infusion  500 mL Intravenous Continuous Milus Banister, MD       Review of Systems All otherwise neg per pt     Objective:   Physical Exam BP 140/86 (BP Location: Left Arm, Patient Position: Sitting, Cuff  Size: Normal)   Pulse 82   Temp 98.4 F (36.9 C) (Oral)   Resp 14   Ht 5' 6.75" (1.695 m)   Wt 230 lb (104.3 kg)   LMP  (LMP Unknown)   SpO2 99%   BMI 36.29 kg/m  VS noted, mild ill Constitutional: Pt appears in no apparent distress HENT: Head: NCAT.  Right Ear: External ear normal.  Left Ear: External ear normal.  Eyes: . Pupils are equal, round, and reactive to light. Conjunctivae and EOM are normal Bilat tm's with mild erythema.  Max sinus areas mild tender.  Pharynx with mild erythema, no exudate Neck: Normal range of motion. Neck supple. with bilat LA Cardiovascular: Normal rate and regular rhythm.   Pulmonary/Chest: Effort normal and breath sounds without rales or wheezing.  Neurological: Pt is alert. Not confused , motor grossly intact Skin: Skin is warm. No rash, no LE edema Psychiatric: Pt behavior is normal. No agitation.  No other exam findings    Assessment & Plan:

## 2016-07-02 NOTE — Assessment & Plan Note (Signed)
Mild, for cough med prn, exam o/w benign

## 2017-02-07 ENCOUNTER — Encounter: Payer: Self-pay | Admitting: Family Medicine

## 2017-02-07 ENCOUNTER — Ambulatory Visit (INDEPENDENT_AMBULATORY_CARE_PROVIDER_SITE_OTHER): Payer: BC Managed Care – PPO | Admitting: Family Medicine

## 2017-02-07 VITALS — BP 138/88 | HR 78 | Temp 99.5°F | Ht 66.75 in | Wt 219.0 lb

## 2017-02-07 DIAGNOSIS — J029 Acute pharyngitis, unspecified: Secondary | ICD-10-CM

## 2017-02-07 NOTE — Assessment & Plan Note (Signed)
Rapid strep negative. Possible for a viral etiology - Counseled on supportive care - Given indications to return

## 2017-02-07 NOTE — Patient Instructions (Signed)
Thank you for coming in,   Please try things such as zyrtec-D or allegra-D which is an antihistamine and decongestant.   Please try afrin which will help with nasal congestion but use for only three days.   Please also try using a netti pot on a regular occasion.  Honey can help with a sore throat.      Please feel free to call with any questions or concerns at any time, at [phone]. --Dr. Schmitz  

## 2017-02-07 NOTE — Progress Notes (Signed)
  Hannah Cain - 46 y.o. female MRN [id number]  Date of birth: 01-05-1971  SUBJECTIVE:  Including CC & ROS.  Chief Complaint  Patient presents with  . Sore Throat    present for 3-4 days. Also states she has headache and nausea. She has tried mucinex and nyquil which did not help.     Hannah Cain is a 46 y.o. female that is  presenting with sore throat, cough and sinus congestion. Her symptoms have been present for 4 days. Unsure if she has had any sick contacts. Denies any fever but has felt warm today. Has malaise. Has not taken any OTC medications. Having pain with swallowing. Her symptoms seem to be getting worse.    Review of Systems  Constitutional: Negative for fever.  HENT: Positive for sinus pain, sinus pressure and sore throat. Negative for ear pain.     HISTORY: Past Medical, Surgical, Social, and Family History Reviewed & Updated per EMR.   Pertinent Historical Findings include:  Past Medical History:  Diagnosis Date  . ALLERGIC RHINITIS 03/17/2007  . ANXIETY 03/17/2007   DENIES 05/31/16  . Arthritis   . URI 07/19/2009    Past Surgical History:  Procedure Laterality Date  . BREAST BIOPSY Right 1990  . MOUTH SURGERY      No Known Allergies  Family History  Problem Relation Age of Onset  . Colon cancer Brother 30       mets to liver  . Liver cancer Brother   . Breast cancer Maternal Grandmother   . Colon polyps Maternal Grandmother   . Stroke Maternal Grandmother   . Hypertension Maternal Grandmother   . Hyperlipidemia Maternal Grandmother   . COPD Maternal Grandmother   . Heart failure Maternal Grandmother      Social History   Social History  . Marital status: Married    Spouse name: N/A  . Number of children: 2  . Years of education: N/A   Occupational History  . educational diagnostician    Social History Main Topics  . Smoking status: Never Smoker  . Smokeless tobacco: Never Used  . Alcohol use No  . Drug use: No  . Sexual activity: Not on file    Other Topics Concern  . Not on file   Social History Narrative  . No narrative on file     PHYSICAL EXAM:  VS: BP 138/88 (BP Location: Left Arm, Patient Position: Sitting, Cuff Size: Normal)   Pulse 78   Temp 99.5 F (37.5 C) (Oral)   Ht 5' 6.75" (1.695 m)   Wt 219 lb (99.3 kg)   SpO2 100%   BMI 34.56 kg/m  Physical Exam Gen: NAD, alert, cooperative with exam,  ENT: normal lips, normal nasal mucosa, No tonsillar exudates, no cervical lymphadenopathy, tympanic members clear and intact bilaterally  Eye: normal EOM, normal conjunctiva and lids CV:  no edema, +2 pedal pulses, S1-S2 regular rate and rhythm   Resp: no accessory muscle use, non-labored, clear to auscultation bilaterally, no crackles or wheezes GI: no masses or tenderness, no hernia  Skin: no rashes, no areas of induration  Neuro: normal tone, normal sensation to touch Psych:  normal insight, alert and oriented MSK: Normal strength, normal gait      ASSESSMENT & PLAN:   Sore throat Rapid strep negative. Possible for a viral etiology - Counseled on supportive care - Given indications to return

## 2017-05-07 ENCOUNTER — Encounter: Payer: Self-pay | Admitting: Internal Medicine

## 2017-05-07 ENCOUNTER — Ambulatory Visit: Payer: BC Managed Care – PPO | Admitting: Internal Medicine

## 2017-05-07 VITALS — BP 132/86 | HR 90 | Temp 98.5°F | Ht 66.75 in | Wt 223.0 lb

## 2017-05-07 DIAGNOSIS — H9201 Otalgia, right ear: Secondary | ICD-10-CM | POA: Diagnosis not present

## 2017-05-07 MED ORDER — LEVOFLOXACIN 500 MG PO TABS: 500.0000 mg | ORAL_TABLET | Freq: Every day | ORAL | 0 refills | Status: AC

## 2017-05-07 MED ORDER — ANTIPYRINE-BENZOCAINE 5.4-1.4 % OT SOLN: 3.0000 [drp] | OTIC | 0 refills | Status: DC | PRN

## 2017-05-07 NOTE — Assessment & Plan Note (Signed)
Mod to severe subjective, c/w infectious etiology, for antibx course, ear drop for pain prn, to f/u any worsening symptoms or concerns

## 2017-05-07 NOTE — Patient Instructions (Addendum)
Please take all new medication as prescribed - the antibiotic, and the ear drop for pain  /You can also take Mucinex (or it's generic off brand) for congestion, and tylenol as needed for pain.  Please continue all other medications as before, and refills have been done if requested.  Please have the pharmacy call with any other refills you may need.  Please keep your appointments with your specialists as you may have planned

## 2017-05-07 NOTE — Progress Notes (Signed)
   Subjective:    Patient ID: Hannah Cain, female    DOB: 28-Dec-1970, 47 y.o.   MRN: [id number]  HPI   Here with 2-3 days acute onset fever, right ear pain, pressure, headache, general weakness and malaise, with mild ST and non prod cough, but pt denies chest pain, wheezing, increased sob or doe, orthopnea, PND, increased LE swelling, palpitations, dizziness or syncope.   Pt denies polydipsia, polyuria.  \ Past Medical History:  Diagnosis Date  . ALLERGIC RHINITIS 03/17/2007  . ANXIETY 03/17/2007   DENIES 05/31/16  . Arthritis   . URI 07/19/2009   Past Surgical History:  Procedure Laterality Date  . BREAST BIOPSY Right 1990  . MOUTH SURGERY      reports that  has never smoked. she has never used smokeless tobacco. She reports that she does not drink alcohol or use drugs. family history includes Breast cancer in her maternal grandmother; COPD in her maternal grandmother; Colon cancer (age of onset: 50) in her brother; Colon polyps in her maternal grandmother; Heart failure in her maternal grandmother; Hyperlipidemia in her maternal grandmother; Hypertension in her maternal grandmother; Liver cancer in her brother; Stroke in her maternal grandmother. No Known Allergies No current outpatient medications on file prior to visit.   Current Facility-Administered Medications on File Prior to Visit  Medication Dose Route Frequency Provider Last Rate Last Dose  . 0.9 %  sodium chloride infusion  500 mL Intravenous Continuous Milus Banister, MD       Review of Systems All other system neg per pt    Objective:   Physical Exam BP 132/86   Pulse 90   Temp 98.5 F (36.9 C) (Oral)   Ht 5' 6.75" (1.695 m)   Wt 223 lb (101.2 kg)   SpO2 99%   BMI 35.19 kg/m   VS noted,  Constitutional: Pt appears in NAD HENT: Head: NCAT.  Right Ear: External ear normal. Right ext canal with trace red/swelling Left Ear: External ear normal.  Eyes: . Pupils are equal, round, and reactive to light. Conjunctivae  and EOM are normal Bilat tm's with mod erythema right > left.  Max sinus areas non tender.  Pharynx with mild erythema, no exudate Nose: without d/c or deformity Neck: Neck supple. Gross normal ROM Cardiovascular: Normal rate and regular rhythm.   Pulmonary/Chest: Effort normal and breath sounds without rales or wheezing.  Neurological: Pt is alert. At baseline orientation, motor grossly intact Skin: Skin is warm. No rashes, other new lesions, no LE edema Psychiatric: Pt behavior is normal without agitation  No other exam findings    Assessment & Plan:

## 2017-05-13 ENCOUNTER — Telehealth: Payer: Self-pay | Admitting: Internal Medicine

## 2017-05-13 NOTE — Telephone Encounter (Signed)
Ok to ask the pharmacy what they might suggest, as I am not sure

## 2017-05-13 NOTE — Telephone Encounter (Signed)
Unfortunately none of those are for pain only; there was no need for tx with antibiotic or steroid in this case   ? Any options on pain drops for ear, maybe OTC is the only option?

## 2017-05-13 NOTE — Telephone Encounter (Signed)
Copied from [place] [id number]. Topic: Quick Communication - See Telephone Encounter >> May 13, 2017  9:17 AM Synthia Innocent wrote: CRM for notification. See Telephone encounter for: Pharmacy calling,  antipyrine-benzocaine Toniann Fail) OTIC solution is no longer made, need another med called in.   05/13/17.

## 2017-05-13 NOTE — Telephone Encounter (Signed)
Called Doly back she states any ear solution for ear infection.Marland Kitchen Neomycin-Poly-hydrocortonsone sol, Dexamethasone sol, ciprofloxacin sol.Marland KitchenJohny Chess

## 2017-05-15 NOTE — Telephone Encounter (Signed)
Pt is no longer needing drops.Hannah KitchenJohny Cain

## 2017-05-28 LAB — HM PAP SMEAR: HM Pap smear: NEGATIVE

## 2017-06-10 ENCOUNTER — Encounter: Payer: Self-pay | Admitting: Internal Medicine

## 2017-06-10 ENCOUNTER — Ambulatory Visit (INDEPENDENT_AMBULATORY_CARE_PROVIDER_SITE_OTHER): Payer: BC Managed Care – PPO | Admitting: Internal Medicine

## 2017-06-10 ENCOUNTER — Other Ambulatory Visit (INDEPENDENT_AMBULATORY_CARE_PROVIDER_SITE_OTHER): Payer: BC Managed Care – PPO

## 2017-06-10 VITALS — BP 128/82 | HR 83 | Temp 98.0°F | Ht 66.75 in | Wt 223.0 lb

## 2017-06-10 DIAGNOSIS — E785 Hyperlipidemia, unspecified: Secondary | ICD-10-CM | POA: Diagnosis not present

## 2017-06-10 DIAGNOSIS — C4491 Basal cell carcinoma of skin, unspecified: Secondary | ICD-10-CM

## 2017-06-10 DIAGNOSIS — Z0000 Encounter for general adult medical examination without abnormal findings: Secondary | ICD-10-CM

## 2017-06-10 DIAGNOSIS — Z114 Encounter for screening for human immunodeficiency virus [HIV]: Secondary | ICD-10-CM

## 2017-06-10 DIAGNOSIS — E7800 Pure hypercholesterolemia, unspecified: Secondary | ICD-10-CM | POA: Insufficient documentation

## 2017-06-10 HISTORY — DX: Basal cell carcinoma of skin, unspecified: C44.91

## 2017-06-10 HISTORY — DX: Hyperlipidemia, unspecified: E78.5

## 2017-06-10 LAB — CBC WITH DIFFERENTIAL/PLATELET
BASOS ABS: 0.1 10*3/uL (ref 0.0–0.1)
Basophils Relative: 0.8 % (ref 0.0–3.0)
EOS ABS: 0.2 10*3/uL (ref 0.0–0.7)
Eosinophils Relative: 2.6 % (ref 0.0–5.0)
HCT: 38.5 % (ref 36.0–46.0)
Hemoglobin: 12.9 g/dL (ref 12.0–15.0)
LYMPHS ABS: 2 10*3/uL (ref 0.7–4.0)
Lymphocytes Relative: 25.6 % (ref 12.0–46.0)
MCHC: 33.4 g/dL (ref 30.0–36.0)
MCV: 87.9 fl (ref 78.0–100.0)
MONO ABS: 0.8 10*3/uL (ref 0.1–1.0)
MONOS PCT: 9.7 % (ref 3.0–12.0)
NEUTROS ABS: 4.9 10*3/uL (ref 1.4–7.7)
NEUTROS PCT: 61.3 % (ref 43.0–77.0)
PLATELETS: 243 10*3/uL (ref 150.0–400.0)
RBC: 4.39 Mil/uL (ref 3.87–5.11)
RDW: 13.7 % (ref 11.5–15.5)
WBC: 7.9 10*3/uL (ref 4.0–10.5)

## 2017-06-10 LAB — BASIC METABOLIC PANEL
BUN: 13 mg/dL (ref 6–23)
CALCIUM: 9.2 mg/dL (ref 8.4–10.5)
CHLORIDE: 104 meq/L (ref 96–112)
CO2: 26 meq/L (ref 19–32)
CREATININE: 0.82 mg/dL (ref 0.40–1.20)
GFR: 79.77 mL/min (ref >60.00)
GLUCOSE: 91 mg/dL (ref 70–99)
Potassium: 4.1 mEq/L (ref 3.5–5.1)
Sodium: 137 mEq/L (ref 135–145)

## 2017-06-10 LAB — URINALYSIS, ROUTINE W REFLEX MICROSCOPIC
BILIRUBIN URINE: NEGATIVE
HGB URINE DIPSTICK: NEGATIVE
Ketones, ur: NEGATIVE
LEUKOCYTES UA: NEGATIVE
NITRITE: NEGATIVE
RBC / HPF: NONE SEEN (ref 0–?)
Specific Gravity, Urine: 1.01 (ref 1.000–1.030)
TOTAL PROTEIN, URINE-UPE24: NEGATIVE
URINE GLUCOSE: NEGATIVE
UROBILINOGEN UA: 0.2 (ref 0.0–1.0)
WBC, UA: NONE SEEN (ref 0–?)
pH: 7 (ref 5.0–8.0)

## 2017-06-10 LAB — HEPATIC FUNCTION PANEL
ALBUMIN: 4.1 g/dL (ref 3.5–5.2)
ALK PHOS: 71 U/L (ref 39–117)
ALT: 14 U/L (ref 0–35)
AST: 14 U/L (ref 0–37)
BILIRUBIN DIRECT: 0.1 mg/dL (ref 0.0–0.3)
BILIRUBIN TOTAL: 0.3 mg/dL (ref 0.2–1.2)
Total Protein: 7.4 g/dL (ref 6.0–8.3)

## 2017-06-10 LAB — LIPID PANEL
CHOL/HDL RATIO: 3
Cholesterol: 164 mg/dL (ref 0–200)
HDL: 58.3 mg/dL (ref >39.00)
LDL Cholesterol: 90 mg/dL (ref 0–99)
NONHDL: 105.85
TRIGLYCERIDES: 79 mg/dL (ref 0.0–149.0)
VLDL: 15.8 mg/dL (ref 0.0–40.0)

## 2017-06-10 LAB — TSH: TSH: 3.36 u[IU]/mL (ref 0.35–4.50)

## 2017-06-10 NOTE — Progress Notes (Signed)
Subjective:    Patient ID: Hannah Cain, female    DOB: 10-Mar-1971, 47 y.o.   MRN: [id number]  HPI  Here for wellness and f/u;  Overall doing ok;  Pt denies Chest pain, worsening SOB, DOE, wheezing, orthopnea, PND, worsening LE edema, palpitations, dizziness or syncope.  Pt denies neurological change such as new headache, facial or extremity weakness.  Pt denies polydipsia, polyuria, or low sugar symptoms. Pt states overall good compliance with treatment and medications, good tolerability, and has been trying to follow appropriate diet.  Pt denies worsening depressive symptoms, suicidal ideation or panic. No fever, night sweats, wt loss, loss of appetite, or other constitutional symptoms.  Pt states good ability with ADL's, has low fall risk, home safety reviewed and adequate, no other significant changes in hearing or vision, and not active with exercise due to family responsibilities and lack of time  Declines flu shot. 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  Past Medical History:  Diagnosis Date  . ALLERGIC RHINITIS 03/17/2007  . ANXIETY 03/17/2007   DENIES 05/31/16  . Arthritis   . Basal cell carcinoma (BCC) 06/10/2017  . HLD (hyperlipidemia) 06/10/2017  . URI 07/19/2009   Past Surgical History:  Procedure Laterality Date  . BREAST BIOPSY Right 1990  . MOUTH SURGERY      reports that  has never smoked. she has never used smokeless tobacco. She reports that she does not drink alcohol or use drugs. family history includes Breast cancer in her maternal grandmother; COPD in her maternal grandmother; Colon cancer (age of onset: 87) in her brother;  Colon polyps in her maternal grandmother; Heart failure in her maternal grandmother; Hyperlipidemia in her maternal grandmother; Hypertension in her maternal grandmother; Liver cancer in her brother; Stroke in her maternal grandmother. No Known Allergies No current outpatient medications on file prior to visit.   No current facility-administered medications on file prior to visit.    Review of Systems Constitutional: Negative for other unusual diaphoresis, sweats, appetite or weight changes HENT: Negative for other worsening hearing loss, ear pain, facial swelling, mouth sores or neck stiffness.   Eyes: Negative for other worsening pain, redness or other visual disturbance.  Respiratory: Negative for other stridor or swelling Cardiovascular: Negative for other palpitations or other chest pain  Gastrointestinal: Negative for worsening diarrhea or loose stools, blood in stool, distention or other pain Genitourinary: Negative for hematuria, flank pain or other change in urine volume.  Musculoskeletal: Negative for myalgias or other joint swelling.  Skin: Negative for other color change, or other wound or worsening drainage.  Neurological: Negative for other syncope or numbness. Hematological: Negative for other adenopathy or swelling Psychiatric/Behavioral: Negative for hallucinations, other worsening agitation, SI, self-injury, or new decreased concentration All other system neg per pt    Objective:   Physical Exam BP 128/82   Pulse 83   Temp 98 F (36.7 C) (Oral)   Ht 5' 6.75" (1.695 m)   Wt 223 lb (101.2 kg)   SpO2 96%   BMI 35.19 kg/m  VS noted,  Constitutional: Pt is oriented to  person, place, and time. Appears well-developed and well-nourished, in no significant distress and comfortable Head: Normocephalic and atraumatic  Eyes: Conjunctivae and EOM are normal. Pupils are equal, round, and reactive to light Right Ear: External ear normal without discharge Left Ear: External ear  normal without discharge Nose: Nose without discharge or deformity Mouth/Throat: Oropharynx is without other ulcerations and moist  Neck: Normal range of motion. Neck supple. No JVD present. No tracheal deviation present or significant neck LA or mass Cardiovascular: Normal rate, regular rhythm, normal heart sounds and intact distal pulses.   Pulmonary/Chest: WOB normal and breath sounds without rales or wheezing  Abdominal: Soft. Bowel sounds are normal. NT. No HSM  Musculoskeletal: Normal range of motion. Exhibits no edema Lymphadenopathy: Has no other cervical adenopathy.  Neurological: Pt is alert and oriented to person, place, and time. Pt has normal reflexes. No cranial nerve deficit. Motor grossly intact, Gait intact Skin: Skin is warm and dry. No rash noted or new ulcerations Psychiatric:  Has normal mood and affect. Behavior is normal without agitation No other exam findings Lab Results  Component Value Date   WBC 7.9 06/10/2017   HGB 12.9 06/10/2017   HCT 38.5 06/10/2017   PLT 243.0 06/10/2017   GLUCOSE 91 06/10/2017   CHOL 164 06/10/2017   TRIG 79.0 06/10/2017   HDL 58.30 06/10/2017   LDLCALC 90 06/10/2017   ALT 14 06/10/2017   AST 14 06/10/2017   NA 137 06/10/2017   K 4.1 06/10/2017   CL 104 06/10/2017   CREATININE 0.82 06/10/2017   BUN 13 06/10/2017   CO2 26 06/10/2017   TSH 3.36 06/10/2017      Assessment & Plan:

## 2017-06-10 NOTE — Patient Instructions (Signed)

## 2017-06-11 LAB — HIV ANTIBODY (ROUTINE TESTING W REFLEX): HIV 1&2 Ab, 4th Generation: NONREACTIVE

## 2017-06-11 NOTE — Assessment & Plan Note (Signed)
Lab Results  Component Value Date   LDLCALC 90 06/10/2017  stable overall by history and exam, recent data reviewed with pt, and pt to continue medical treatment as before,  to f/u any worsening symptoms or concerns

## 2017-06-11 NOTE — Assessment & Plan Note (Signed)

## 2017-06-24 ENCOUNTER — Encounter: Payer: Self-pay | Admitting: Internal Medicine

## 2017-06-30 ENCOUNTER — Other Ambulatory Visit: Payer: BC Managed Care – PPO

## 2017-06-30 ENCOUNTER — Ambulatory Visit: Payer: BC Managed Care – PPO | Admitting: Internal Medicine

## 2017-06-30 ENCOUNTER — Encounter: Payer: Self-pay | Admitting: Internal Medicine

## 2017-06-30 DIAGNOSIS — R197 Diarrhea, unspecified: Secondary | ICD-10-CM | POA: Insufficient documentation

## 2017-06-30 DIAGNOSIS — A09 Infectious gastroenteritis and colitis, unspecified: Secondary | ICD-10-CM

## 2017-06-30 MED ORDER — METRONIDAZOLE 250 MG PO TABS: 250.0000 mg | ORAL_TABLET | Freq: Three times a day (TID) | ORAL | 0 refills | Status: AC

## 2017-06-30 MED ORDER — CIPROFLOXACIN HCL 500 MG PO TABS: 500.0000 mg | ORAL_TABLET | Freq: Two times a day (BID) | ORAL | 0 refills | Status: AC

## 2017-06-30 NOTE — Assessment & Plan Note (Signed)
?   Colitis, for cipro/flagyl asd, stool culture/biofire,  to f/u any worsening symptoms or concerns

## 2017-06-30 NOTE — Patient Instructions (Addendum)
Please take all new medication as prescribed - the antibiotics  Please continue all other medications as before, and refills have been done if requested.  Please have the pharmacy call with any other refills you may need.  Please keep your appointments with your specialists as you may have planned  Please go to the LAB in the Basement (turn left off the elevator) for the tests to be done today- the stool testing  You will be contacted by phone if any changes need to be made immediately.  Otherwise, you will receive a letter about your results with an explanation, but please check with MyChart first.  Please remember to sign up for MyChart if you have not done so, as this will be important to you in the future with finding out test results, communicating by private email, and scheduling acute appointments online when needed.

## 2017-06-30 NOTE — Progress Notes (Signed)
   Subjective:    Patient ID: Hannah Cain, female    DOB: February 06, 1971, 47 y.o.   MRN: [id number]  HPI  Here with c/o recent URI symptoms of Head cold and congestion, pressure, HA , Bad taste and smell in mouth last wk, small cough, that has largely resolved Then 5 days ago started with some nausea Then had acute nonbloody but nonwatery diarrhea, feeling of gas, constipation but not, stool lighter in color, also with LLQ tenderness with some bloating.  No vomiting, high fever or chills, just feels weak and ill.  Pt denies polydipsia, polyuria  No other acute complaints Denies urinary symptoms such as dysuria, frequency, urgency, flank pain, hematuria or n/v, fever, chills.   Past Medical History:  Diagnosis Date  . ALLERGIC RHINITIS 03/17/2007  . ANXIETY 03/17/2007   DENIES 05/31/16  . Arthritis   . Basal cell carcinoma (BCC) 06/10/2017  . HLD (hyperlipidemia) 06/10/2017  . URI 07/19/2009   Past Surgical History:  Procedure Laterality Date  . BREAST BIOPSY Right 1990  . MOUTH SURGERY      reports that she has never smoked. She has never used smokeless tobacco. She reports that she does not drink alcohol or use drugs. family history includes Breast cancer in her maternal grandmother; COPD in her maternal grandmother; Colon cancer (age of onset: 87) in her brother; Colon polyps in her maternal grandmother; Heart failure in her maternal grandmother; Hyperlipidemia in her maternal grandmother; Hypertension in her maternal grandmother; Liver cancer in her brother; Stroke in her maternal grandmother. No Known Allergies No current outpatient medications on file prior to visit.   No current facility-administered medications on file prior to visit.   . Review of Systems  All other system neg per pt    Objective:   Physical Exam BP 132/84   Pulse 89   Temp 98.5 F (36.9 C) (Oral)   Ht 5' 6.75" (1.695 m)   Wt 225 lb (102.1 kg)   SpO2 99%   BMI 35.50 kg/m  VS noted,  Constitutional: Pt appears in  NAD HENT: Head: NCAT.  Right Ear: External ear normal.  Left Ear: External ear normal.  Bilat tm's with mild erythema.  Max sinus areas non tender.  Pharynx with mild erythema, no exudate Eyes: . Pupils are equal, round, and reactive to light. Conjunctivae and EOM are normal Nose: without d/c or deformity Neck: Neck supple. Gross normal ROM Cardiovascular: Normal rate and regular rhythm.   Pulmonary/Chest: Effort normal and breath sounds without rales or wheezing.  Abd:  Soft, ND, + BS, no organomegaly, mild LLQ tender without guarding or rebound Neurological: Pt is alert. At baseline orientation, motor grossly intact Skin: Skin is warm. No rashes, other new lesions, no LE edema Psychiatric: Pt behavior is normal without agitation  No other exam findings     Assessment & Plan:

## 2017-07-01 ENCOUNTER — Other Ambulatory Visit: Payer: BC Managed Care – PPO

## 2017-07-01 DIAGNOSIS — A09 Infectious gastroenteritis and colitis, unspecified: Secondary | ICD-10-CM

## 2017-07-01 NOTE — Addendum Note (Signed)
Addended by: Juliet Rude on: 07/01/2017 09:48 AM   Modules accepted: Orders

## 2017-07-11 LAB — GASTROINTESTINAL PATHOGEN PANEL PCR
C. DIFFICILE TOX A/B, PCR: NOT DETECTED
Campylobacter, PCR: NOT DETECTED
Cryptosporidium, PCR: NOT DETECTED
E COLI 0157, PCR: NOT DETECTED
E coli (ETEC) LT/ST PCR: NOT DETECTED
E coli (STEC) stx1/stx2, PCR: NOT DETECTED
Giardia lamblia, PCR: NOT DETECTED
Norovirus, PCR: NOT DETECTED
Rotavirus A, PCR: NOT DETECTED
SALMONELLA, PCR: NOT DETECTED
Shigella, PCR: NOT DETECTED

## 2017-11-12 ENCOUNTER — Encounter: Payer: Self-pay | Admitting: Internal Medicine

## 2017-11-12 ENCOUNTER — Other Ambulatory Visit (INDEPENDENT_AMBULATORY_CARE_PROVIDER_SITE_OTHER): Payer: BC Managed Care – PPO

## 2017-11-12 ENCOUNTER — Ambulatory Visit: Payer: BC Managed Care – PPO | Admitting: Internal Medicine

## 2017-11-12 ENCOUNTER — Encounter

## 2017-11-12 ENCOUNTER — Ambulatory Visit (INDEPENDENT_AMBULATORY_CARE_PROVIDER_SITE_OTHER)
Admission: RE | Admit: 2017-11-12 | Discharge: 2017-11-12 | Disposition: A | Discharge status: Home / Self Care | Payer: BC Managed Care – PPO | Source: Ambulatory Visit | Attending: Internal Medicine | Admitting: Internal Medicine

## 2017-11-12 VITALS — BP 124/76 | HR 87 | Temp 98.8°F | Ht 66.75 in | Wt 224.0 lb

## 2017-11-12 DIAGNOSIS — J309 Allergic rhinitis, unspecified: Secondary | ICD-10-CM | POA: Diagnosis not present

## 2017-11-12 DIAGNOSIS — R002 Palpitations: Secondary | ICD-10-CM | POA: Diagnosis not present

## 2017-11-12 DIAGNOSIS — F411 Generalized anxiety disorder: Secondary | ICD-10-CM

## 2017-11-12 DIAGNOSIS — E785 Hyperlipidemia, unspecified: Secondary | ICD-10-CM | POA: Diagnosis not present

## 2017-11-12 HISTORY — DX: Palpitations: R00.2

## 2017-11-12 LAB — CBC WITH DIFFERENTIAL/PLATELET
BASOS ABS: 0 10*3/uL (ref 0.0–0.1)
Basophils Relative: 0.3 % (ref 0.0–3.0)
Eosinophils Absolute: 0.1 10*3/uL (ref 0.0–0.7)
Eosinophils Relative: 1.6 % (ref 0.0–5.0)
HEMATOCRIT: 41 % (ref 36.0–46.0)
HEMOGLOBIN: 13.7 g/dL (ref 12.0–15.0)
LYMPHS PCT: 26.6 % (ref 12.0–46.0)
Lymphs Abs: 1.9 10*3/uL (ref 0.7–4.0)
MCHC: 33.5 g/dL (ref 30.0–36.0)
MCV: 88 fl (ref 78.0–100.0)
MONOS PCT: 9.9 % (ref 3.0–12.0)
Monocytes Absolute: 0.7 10*3/uL (ref 0.1–1.0)
NEUTROS ABS: 4.3 10*3/uL (ref 1.4–7.7)
Neutrophils Relative %: 61.6 % (ref 43.0–77.0)
Platelets: 228 10*3/uL (ref 150.0–400.0)
RBC: 4.66 Mil/uL (ref 3.87–5.11)
RDW: 13.5 % (ref 11.5–15.5)
WBC: 7 10*3/uL (ref 4.0–10.5)

## 2017-11-12 LAB — BASIC METABOLIC PANEL
BUN: 10 mg/dL (ref 6–23)
CALCIUM: 9.4 mg/dL (ref 8.4–10.5)
CO2: 24 mEq/L (ref 19–32)
CREATININE: 0.8 mg/dL (ref 0.40–1.20)
Chloride: 104 mEq/L (ref 96–112)
GFR: 81.92 mL/min (ref >60.00)
Glucose, Bld: 98 mg/dL (ref 70–99)
Potassium: 3.8 mEq/L (ref 3.5–5.1)
Sodium: 137 mEq/L (ref 135–145)

## 2017-11-12 LAB — HEPATIC FUNCTION PANEL
ALBUMIN: 4.2 g/dL (ref 3.5–5.2)
ALK PHOS: 53 U/L (ref 39–117)
ALT: 11 U/L (ref 0–35)
AST: 12 U/L (ref 0–37)
BILIRUBIN DIRECT: 0.1 mg/dL (ref 0.0–0.3)
Total Bilirubin: 0.6 mg/dL (ref 0.2–1.2)
Total Protein: 7.6 g/dL (ref 6.0–8.3)

## 2017-11-12 LAB — MAGNESIUM: MAGNESIUM: 2.1 mg/dL (ref 1.5–2.5)

## 2017-11-12 LAB — TSH: TSH: 4.16 u[IU]/mL (ref 0.35–4.50)

## 2017-11-12 NOTE — Assessment & Plan Note (Signed)
Etiology unclear, diff includes pac, pvc, svt or intermittent afib (less likely); for ecg today, echo., cxr, labs as ordered, and 2 wks cardiac event monitor, consider metoprolol 12.5 bid prn but declines today

## 2017-11-12 NOTE — Assessment & Plan Note (Signed)
Stable overall, doubt a signficant issue related today

## 2017-11-12 NOTE — Assessment & Plan Note (Signed)
stable overall by history and exam, recent data reviewed with pt, and pt to continue medical treatment as before,  to f/u any worsening symptoms or concerns Lab Results  Component Value Date   LDLCALC 90 06/10/2017

## 2017-11-12 NOTE — Patient Instructions (Signed)
Your EKG was OK today  Please continue all other medications as before, and refills have been done if requested.  Please have the pharmacy call with any other refills you may need.  Please continue your efforts at being more active, low cholesterol diet, and weight control.  Please keep your appointments with your specialists as you may have planned  Please go to the XRAY Department in the Basement (go straight as you get off the elevator) for the x-ray testing  Please go to the LAB in the Basement (turn left off the elevator) for the tests to be done today  You will be contacted regarding the referral for: cardiac event monitor and echocardiogram  You will be contacted by phone if any changes need to be made immediately.  Otherwise, you will receive a letter about your results with an explanation, but please check with MyChart first.  Please remember to sign up for MyChart if you have not done so, as this will be important to you in the future with finding out test results, communicating by private email, and scheduling acute appointments online when needed.

## 2017-11-12 NOTE — Addendum Note (Signed)
Addended by: Juliet Rude on: 11/12/2017 08:42 AM   Modules accepted: Orders

## 2017-11-12 NOTE — Assessment & Plan Note (Signed)
To avoid any decongestants, o/w stable

## 2017-11-12 NOTE — Progress Notes (Addendum)
Subjective:    Patient ID: Hannah Cain, female    DOB: 03-14-1971, 47 y.o.   MRN: [id number]  HPI    Here to c/o heart palpitations for 1 wk, random , occurs only "several times over a short while", can be frequent for a time like 2 beats every 10 seconds but other times much less, Pt denies chest pain, increased sob or doe, wheezing, orthopnea, PND, increased LE swelling, dizziness or syncope.  Did start new HRT for heavy vaginal bleeding, wondering if related. Does c/o ongoing fatigue, but denies signficant daytime hypersomnolence.  Overall bleeding has slowed since last normal hgb in mar 2019.  Last thyroid testing normal mar 2019.  No more stress than usual,Denies worsening depressive symptoms, suicidal ideation, or panic  Has been trying to cut back in caffeine, overall less this wk, but no change in palpitations.  No prior hx of heart diz, no recent echo, holter or stress testing.  CXR 2012 - neg for CMG.  .  Past Medical History:  Diagnosis Date  . ALLERGIC RHINITIS 03/17/2007  . ANXIETY 03/17/2007   DENIES 05/31/16  . Arthritis   . Basal cell carcinoma (BCC) 06/10/2017  . HLD (hyperlipidemia) 06/10/2017  . URI 07/19/2009   Past Surgical History:  Procedure Laterality Date  . BREAST BIOPSY Right 1990  . MOUTH SURGERY      reports that she has never smoked. She has never used smokeless tobacco. She reports that she does not drink alcohol or use drugs. family history includes Breast cancer in her maternal grandmother; COPD in her maternal grandmother; Colon cancer (age of onset: 49) in her brother; Colon polyps in her maternal grandmother; Heart failure in her maternal grandmother; Hyperlipidemia in her maternal grandmother; Hypertension in her maternal grandmother; Liver cancer in her brother; Stroke in her maternal grandmother. No Known Allergies No current outpatient medications on file prior to visit.   No current facility-administered medications on file prior to visit.     Review of  Systems  Constitutional: Negative for other unusual diaphoresis or sweats HENT: Negative for ear discharge or swelling Eyes: Negative for other worsening visual disturbances Respiratory: Negative for stridor or other swelling  Gastrointestinal: Negative for worsening distension or other blood Genitourinary: Negative for retention or other urinary change Musculoskeletal: Negative for other MSK pain or swelling Skin: Negative for color change or other new lesions Neurological: Negative for worsening tremors and other numbness  Psychiatric/Behavioral: Negative for worsening agitation or other fatigue All other system neg per pt    Objective:   Physical Exam BP 124/76   Pulse 87   Temp 98.8 F (37.1 C) (Oral)   Ht 5' 6.75" (1.695 m)   Wt 224 lb (101.6 kg)   SpO2 98%   BMI 35.35 kg/m  VS noted,  Constitutional: Pt appears in NAD HENT: Head: NCAT.  Right Ear: External ear normal.  Left Ear: External ear normal.  Eyes: . Pupils are equal, round, and reactive to light. Conjunctivae and EOM are normal Nose: without d/c or deformity Neck: Neck supple. Gross normal ROM Cardiovascular: Normal rate and regular rhythm.   Pulmonary/Chest: Effort normal and breath sounds without rales or wheezing.  Abd:  Soft, NT, ND, + BS, no organomegaly Neurological: Pt is alert. At baseline orientation, motor grossly intact Skin: Skin is warm. No rashes, other new lesions, no LE edema Psychiatric: Pt behavior is normal without agitation  No other exam findings Lab Results  Component Value Date   WBC  7.9 06/10/2017   HGB 12.9 06/10/2017   HCT 38.5 06/10/2017   PLT 243.0 06/10/2017   GLUCOSE 91 06/10/2017   CHOL 164 06/10/2017   TRIG 79.0 06/10/2017   HDL 58.30 06/10/2017   LDLCALC 90 06/10/2017   ALT 14 06/10/2017   AST 14 06/10/2017   NA 137 06/10/2017   K 4.1 06/10/2017   CL 104 06/10/2017   CREATININE 0.82 06/10/2017   BUN 13 06/10/2017   CO2 26 06/10/2017   TSH 3.36 06/10/2017   ECG  today I have personally interpreted: NSR, no ectopy or arrythmia    Assessment & Plan:

## 2017-11-14 ENCOUNTER — Other Ambulatory Visit: Payer: Self-pay | Admitting: Internal Medicine

## 2017-11-14 DIAGNOSIS — R002 Palpitations: Secondary | ICD-10-CM

## 2017-11-14 NOTE — Addendum Note (Signed)
Addended by: Aviva Signs M on: 11/14/2017 03:01 PM   Modules accepted: Orders

## 2017-11-18 ENCOUNTER — Ambulatory Visit (HOSPITAL_COMMUNITY): Payer: BC Managed Care – PPO | Attending: Cardiology

## 2017-11-18 ENCOUNTER — Other Ambulatory Visit: Payer: Self-pay

## 2017-11-18 DIAGNOSIS — Z6835 Body mass index (BMI) 35.0-35.9, adult: Secondary | ICD-10-CM | POA: Insufficient documentation

## 2017-11-18 DIAGNOSIS — E669 Obesity, unspecified: Secondary | ICD-10-CM | POA: Diagnosis not present

## 2017-11-18 DIAGNOSIS — R002 Palpitations: Secondary | ICD-10-CM | POA: Insufficient documentation

## 2017-11-24 NOTE — Addendum Note (Signed)
Addended by: Karren Cobble on: 11/24/2017 03:04 PM   Modules accepted: Orders

## 2017-11-26 ENCOUNTER — Ambulatory Visit (INDEPENDENT_AMBULATORY_CARE_PROVIDER_SITE_OTHER): Payer: BC Managed Care – PPO

## 2017-11-26 DIAGNOSIS — R002 Palpitations: Secondary | ICD-10-CM | POA: Diagnosis not present

## 2017-11-28 ENCOUNTER — Encounter: Payer: Self-pay | Admitting: Internal Medicine

## 2018-02-26 LAB — HM MAMMOGRAPHY

## 2018-02-27 ENCOUNTER — Ambulatory Visit: Payer: BC Managed Care – PPO | Admitting: Internal Medicine

## 2018-02-27 ENCOUNTER — Encounter: Payer: Self-pay | Admitting: Internal Medicine

## 2018-02-27 VITALS — BP 122/76 | HR 86 | Temp 98.9°F | Ht 66.75 in | Wt 218.0 lb

## 2018-02-27 DIAGNOSIS — F411 Generalized anxiety disorder: Secondary | ICD-10-CM | POA: Diagnosis not present

## 2018-02-27 DIAGNOSIS — J019 Acute sinusitis, unspecified: Secondary | ICD-10-CM | POA: Diagnosis not present

## 2018-02-27 DIAGNOSIS — H6983 Other specified disorders of Eustachian tube, bilateral: Secondary | ICD-10-CM | POA: Diagnosis not present

## 2018-02-27 DIAGNOSIS — J309 Allergic rhinitis, unspecified: Secondary | ICD-10-CM

## 2018-02-27 MED ORDER — LEVOFLOXACIN 500 MG PO TABS: 500.0000 mg | ORAL_TABLET | Freq: Every day | ORAL | 0 refills | Status: AC

## 2018-02-27 NOTE — Progress Notes (Signed)
Subjective:    Patient ID: Hannah Cain, female    DOB: [birth year], 47 y.o.   MRN: [id number]  HPI     Here with 2-3 days acute onset fever, facial pain, pressure, headache, general weakness and malaise, and greenish d/c, with mild ST and cough, but pt denies chest pain, wheezing, increased sob or doe, orthopnea, PND, increased LE swelling, palpitations, dizziness or syncope, but also assoc with bilat ear fullness and popping, left > right.  Does have several wks ongoing nasal allergy symptoms with clearish congestion, itch and sneezing, without fever, pain, ST, cough, swelling or wheezing.  Denies worsening depressive symptoms, suicidal ideation, or panic; has ongoing anxiety, overall no change Past Medical History:  Diagnosis Date  . ALLERGIC RHINITIS 03/17/2007  . ANXIETY 03/17/2007   DENIES 05/31/16  . Arthritis   . Basal cell carcinoma (BCC) 06/10/2017  . HLD (hyperlipidemia) 06/10/2017  . URI 07/19/2009   Past Surgical History:  Procedure Laterality Date  . BREAST BIOPSY Right 1990  . MOUTH SURGERY      reports that she has never smoked. She has never used smokeless tobacco. She reports that she does not drink alcohol or use drugs. family history includes Breast cancer in her maternal grandmother; COPD in her maternal grandmother; Colon cancer (age of onset: 67) in her brother; Colon polyps in her maternal grandmother; Heart failure in her maternal grandmother; Hyperlipidemia in her maternal grandmother; Hypertension in her maternal grandmother; Liver cancer in her brother; Stroke in her maternal grandmother. No Known Allergies No current outpatient medications on file prior to visit.   No current facility-administered medications on file prior to visit.    Review of Systems  Constitutional: Negative for other unusual diaphoresis or sweats HENT: Negative for ear discharge or swelling Eyes: Negative for other worsening visual disturbances Respiratory: Negative for stridor or other  swelling  Gastrointestinal: Negative for worsening distension or other blood Genitourinary: Negative for retention or other urinary change Musculoskeletal: Negative for other MSK pain or swelling Skin: Negative for color change or other new lesions Neurological: Negative for worsening tremors and other numbness  Psychiatric/Behavioral: Negative for worsening agitation or other fatigue All other system neg per pt    Objective:   Physical Exam BP 122/76   Pulse 86   Temp 98.9 F (37.2 C) (Oral)   Ht 5' 6.75" (1.695 m)   Wt 218 lb (98.9 kg)   SpO2 98%   BMI 34.40 kg/m  VS noted, mild ill Constitutional: Pt appears in NAD HENT: Head: NCAT.  Right Ear: External ear normal.  Left Ear: External ear normal.  Bilat tm's with mild erythema.  Max sinus areas mild tender.  Pharynx with mild erythema, no exudate Eyes: . Pupils are equal, round, and reactive to light. Conjunctivae and EOM are normal Nose: without d/c or deformity Neck: Neck supple. Gross normal ROM Cardiovascular: Normal rate and regular rhythm.   Pulmonary/Chest: Effort normal and breath sounds without rales or wheezing.  Neurological: Pt is alert. At baseline orientation, motor grossly intact Skin: Skin is warm. No rashes, other new lesions, no LE edema Psychiatric: Pt behavior is normal without agitation  No other exam findings Lab Results  Component Value Date   WBC 7.0 11/12/2017   HGB 13.7 11/12/2017   HCT 41.0 11/12/2017   PLT 228.0 11/12/2017   GLUCOSE 98 11/12/2017   CHOL 164 06/10/2017   TRIG 79.0 06/10/2017   HDL 58.30 06/10/2017   LDLCALC 90 06/10/2017   ALT  11 11/12/2017   AST 12 11/12/2017   NA 137 11/12/2017   K 3.8 11/12/2017   CL 104 11/12/2017   CREATININE 0.80 11/12/2017   BUN 10 11/12/2017   CO2 24 11/12/2017   TSH 4.16 11/12/2017       Assessment & Plan:

## 2018-02-27 NOTE — Patient Instructions (Signed)
Please take all new medication as prescribed  You can also take Delsym OTC for cough, and/or Mucinex (or it's generic off brand) for congestion, and tylenol as needed for pain.  Please continue all other medications as before, and refills have been done if requested.  Please have the pharmacy call with any other refills you may need.  Please keep your appointments with your specialists as you may have planned     

## 2018-02-28 NOTE — Assessment & Plan Note (Signed)
Mild to mod, for antibx course,  to f/u any worsening symptoms or concerns 

## 2018-02-28 NOTE — Assessment & Plan Note (Signed)
Mild to mod, for mucinex asd prn,,  to f/u any worsening symptoms or concerns

## 2018-02-28 NOTE — Assessment & Plan Note (Signed)
stable overall by history and exam, recent data reviewed with pt, and pt to continue medical treatment as before,  to f/u any worsening symptoms or concerns  

## 2018-02-28 NOTE — Assessment & Plan Note (Signed)
Mild to mod, for otc allegra prn, to f/u any worsening symptoms or concerns

## 2018-06-11 ENCOUNTER — Encounter: Payer: Self-pay | Admitting: Internal Medicine

## 2018-06-11 ENCOUNTER — Ambulatory Visit (INDEPENDENT_AMBULATORY_CARE_PROVIDER_SITE_OTHER): Payer: BC Managed Care – PPO | Admitting: Internal Medicine

## 2018-06-11 VITALS — BP 144/88 | HR 88 | Temp 98.9°F | Wt 223.0 lb

## 2018-06-11 DIAGNOSIS — R002 Palpitations: Secondary | ICD-10-CM

## 2018-06-11 DIAGNOSIS — I493 Ventricular premature depolarization: Secondary | ICD-10-CM

## 2018-06-11 DIAGNOSIS — I10 Essential (primary) hypertension: Secondary | ICD-10-CM | POA: Insufficient documentation

## 2018-06-11 DIAGNOSIS — F411 Generalized anxiety disorder: Secondary | ICD-10-CM | POA: Diagnosis not present

## 2018-06-11 HISTORY — DX: Ventricular premature depolarization: I49.3

## 2018-06-11 MED ORDER — METOPROLOL SUCCINATE ER 50 MG PO TB24: 50.0000 mg | ORAL_TABLET | Freq: Every day | ORAL | 3 refills | Status: DC

## 2018-06-11 NOTE — Assessment & Plan Note (Signed)
New onset without obvious cause, start toprol XL 50 qd, f/u BP at home and next visit

## 2018-06-11 NOTE — Patient Instructions (Signed)
Please take all new medication as prescribed - the generic Toprol XL 50 mg per day  Please check your Blood pressure at home on a regular basis, with the goal being to be less than 130/80 if possibe  Please also continue to follow a lower salt diet, and work on weight control as you do.  Please continue all other medications as before, and refills have been done if requested.  Please have the pharmacy call with any other refills you may need.  Please continue your efforts at being more active, low cholesterol diet, and weight control  Please keep your appointments with your specialists as you may have planned

## 2018-06-11 NOTE — Progress Notes (Signed)
Subjective:    Patient ID: Hannah Cain, female    DOB: 1971/04/04, 48 y.o.   MRN: [id number]  HPI  Here to f/u; overall doing ok,  Pt denies chest pain, increasing sob or doe, wheezing, orthopnea, PND, increased LE swelling, dizziness or syncope.  Pt denies new neurological symptoms such as new headache, or facial or extremity weakness or numbness.  Pt denies polydipsia, polyuria, or low sugar episode.  Pt states overall good compliance with meds, mostly trying to follow appropriate diet, with wt overall stable,  but little exercise however.  BP at GYN yesterday with 170/80, and repeat 190/90.  BCPs stopped, but BP today still elevated.  Still having PVC palpitations, possibly worse overall. Fixated on her thyroid despite normal TSH several times.  Denies hyper or hypo thyroid symptoms such as voice, skin or hair change. BP Readings from Last 3 Encounters:  06/11/18 (!) 144/88  02/27/18 122/76  11/12/17 124/76   Wt Readings from Last 3 Encounters:  06/11/18 223 lb (101.2 kg)  02/27/18 218 lb (98.9 kg)  11/12/17 224 lb (101.6 kg)   Past Medical History:  Diagnosis Date  . ALLERGIC RHINITIS 03/17/2007  . ANXIETY 03/17/2007   DENIES 05/31/16  . Arthritis   . Basal cell carcinoma (BCC) 06/10/2017  . HLD (hyperlipidemia) 06/10/2017  . URI 07/19/2009   Past Surgical History:  Procedure Laterality Date  . BREAST BIOPSY Right 1990  . MOUTH SURGERY      reports that she has never smoked. She has never used smokeless tobacco. She reports that she does not drink alcohol or use drugs. family history includes Breast cancer in her maternal grandmother; COPD in her maternal grandmother; Colon cancer (age of onset: 39) in her brother; Colon polyps in her maternal grandmother; Heart failure in her maternal grandmother; Hyperlipidemia in her maternal grandmother; Hypertension in her maternal grandmother; Liver cancer in her brother; Stroke in her maternal grandmother. No Known Allergies No current  outpatient medications on file prior to visit.   No current facility-administered medications on file prior to visit.    Review of Systems  Constitutional: Negative for other unusual diaphoresis or sweats HENT: Negative for ear discharge or swelling Eyes: Negative for other worsening visual disturbances Respiratory: Negative for stridor or other swelling  Gastrointestinal: Negative for worsening distension or other blood Genitourinary: Negative for retention or other urinary change Musculoskeletal: Negative for other MSK pain or swelling Skin: Negative for color change or other new lesions Neurological: Negative for worsening tremors and other numbness  Psychiatric/Behavioral: Negative for worsening agitation or other fatigue All other system neg per pt    Objective:   Physical Exam BP (!) 144/88   Pulse 88   Temp 98.9 F (37.2 C) (Oral)   Wt 223 lb (101.2 kg)   SpO2 97%   BMI 35.19 kg/m  VS noted,  Constitutional: Pt appears in NAD HENT: Head: NCAT.  Right Ear: External ear normal.  Left Ear: External ear normal.  Eyes: . Pupils are equal, round, and reactive to light. Conjunctivae and EOM are normal Nose: without d/c or deformity Neck: Neck supple. Gross normal ROM Cardiovascular: Normal rate and regular rhythm.   Pulmonary/Chest: Effort normal and breath sounds without rales or wheezing.  Abd:  Soft, NT, ND, + BS, no organomegaly Neurological: Pt is alert. At baseline orientation, motor grossly intact Skin: Skin is warm. No rashes, other new lesions, no LE edema Psychiatric: Pt behavior is normal without agitation , mild nervous  Echo 8-13/90 - normal  Holter monitor 11/26/2017  - SR with PVC's     Assessment & Plan:

## 2018-06-11 NOTE — Assessment & Plan Note (Signed)
Denies worsening s/s,  to f/u any worsening symptoms or concerns

## 2018-06-11 NOTE — Assessment & Plan Note (Signed)
Also should improve with BB as above,  to f/u any worsening symptoms or concerns

## 2018-06-24 ENCOUNTER — Other Ambulatory Visit (INDEPENDENT_AMBULATORY_CARE_PROVIDER_SITE_OTHER): Payer: BC Managed Care – PPO

## 2018-06-24 DIAGNOSIS — R002 Palpitations: Secondary | ICD-10-CM

## 2018-06-24 DIAGNOSIS — Z0000 Encounter for general adult medical examination without abnormal findings: Secondary | ICD-10-CM | POA: Diagnosis not present

## 2018-06-24 LAB — BASIC METABOLIC PANEL
BUN: 14 mg/dL (ref 6–23)
CO2: 27 mEq/L (ref 19–32)
CREATININE: 0.76 mg/dL (ref 0.40–1.20)
Calcium: 9.1 mg/dL (ref 8.4–10.5)
Chloride: 101 mEq/L (ref 96–112)
GFR: 81.56 mL/min (ref >60.00)
Glucose, Bld: 89 mg/dL (ref 70–99)
Potassium: 3.6 mEq/L (ref 3.5–5.1)
Sodium: 137 mEq/L (ref 135–145)

## 2018-06-24 LAB — CBC WITH DIFFERENTIAL/PLATELET
Basophils Absolute: 0 10*3/uL (ref 0.0–0.1)
Basophils Relative: 0.3 % (ref 0.0–3.0)
Eosinophils Absolute: 0.2 10*3/uL (ref 0.0–0.7)
Eosinophils Relative: 2.7 % (ref 0.0–5.0)
HCT: 42.5 % (ref 36.0–46.0)
Hemoglobin: 14.3 g/dL (ref 12.0–15.0)
Lymphocytes Relative: 32.4 % (ref 12.0–46.0)
Lymphs Abs: 2.1 10*3/uL (ref 0.7–4.0)
MCHC: 33.6 g/dL (ref 30.0–36.0)
MCV: 88.7 fl (ref 78.0–100.0)
Monocytes Absolute: 0.6 10*3/uL (ref 0.1–1.0)
Monocytes Relative: 10 % (ref 3.0–12.0)
Neutro Abs: 3.5 10*3/uL (ref 1.4–7.7)
Neutrophils Relative %: 54.6 % (ref 43.0–77.0)
Platelets: 258 10*3/uL (ref 150.0–400.0)
RBC: 4.79 Mil/uL (ref 3.87–5.11)
RDW: 13.5 % (ref 11.5–15.5)
WBC: 6.4 10*3/uL (ref 4.0–10.5)

## 2018-06-24 LAB — URINALYSIS, ROUTINE W REFLEX MICROSCOPIC
Bilirubin Urine: NEGATIVE
Ketones, ur: NEGATIVE
Leukocytes,Ua: NEGATIVE
Nitrite: NEGATIVE
Specific Gravity, Urine: 1.025 (ref 1.000–1.030)
Total Protein, Urine: NEGATIVE
Urine Glucose: NEGATIVE
Urobilinogen, UA: 0.2 (ref 0.0–1.0)
pH: 5.5 (ref 5.0–8.0)

## 2018-06-24 LAB — HEPATIC FUNCTION PANEL
ALBUMIN: 4.1 g/dL (ref 3.5–5.2)
ALK PHOS: 64 U/L (ref 39–117)
ALT: 19 U/L (ref 0–35)
AST: 16 U/L (ref 0–37)
Bilirubin, Direct: 0.1 mg/dL (ref 0.0–0.3)
Total Bilirubin: 0.7 mg/dL (ref 0.2–1.2)
Total Protein: 7.3 g/dL (ref 6.0–8.3)

## 2018-06-24 LAB — T4, FREE: Free T4: 0.76 ng/dL (ref 0.60–1.60)

## 2018-06-24 LAB — TSH: TSH: 3.05 u[IU]/mL (ref 0.35–4.50)

## 2018-06-24 LAB — LIPID PANEL
Cholesterol: 200 mg/dL (ref 0–200)
HDL: 61 mg/dL (ref >39.00)
LDL Cholesterol: 125 mg/dL — ABNORMAL HIGH (ref 0–99)
NonHDL: 138.73
Total CHOL/HDL Ratio: 3
Triglycerides: 70 mg/dL (ref 0.0–149.0)
VLDL: 14 mg/dL (ref 0.0–40.0)

## 2018-06-26 ENCOUNTER — Encounter: Payer: Self-pay | Admitting: Internal Medicine

## 2018-07-01 ENCOUNTER — Telehealth: Payer: Self-pay

## 2018-07-01 NOTE — Telephone Encounter (Signed)
Called pt to screen for covid-19 symptoms.   Could not leave a Vm. Pt's VM is full.

## 2018-07-02 ENCOUNTER — Other Ambulatory Visit: Payer: Self-pay

## 2018-07-02 ENCOUNTER — Ambulatory Visit (INDEPENDENT_AMBULATORY_CARE_PROVIDER_SITE_OTHER): Payer: BC Managed Care – PPO | Admitting: Internal Medicine

## 2018-07-02 ENCOUNTER — Encounter: Payer: Self-pay | Admitting: Internal Medicine

## 2018-07-02 VITALS — BP 126/76 | HR 80 | Temp 98.8°F | Ht 66.75 in | Wt 219.0 lb

## 2018-07-02 DIAGNOSIS — Z0000 Encounter for general adult medical examination without abnormal findings: Secondary | ICD-10-CM

## 2018-07-02 DIAGNOSIS — Z23 Encounter for immunization: Secondary | ICD-10-CM | POA: Diagnosis not present

## 2018-07-02 DIAGNOSIS — I10 Essential (primary) hypertension: Secondary | ICD-10-CM

## 2018-07-02 DIAGNOSIS — E785 Hyperlipidemia, unspecified: Secondary | ICD-10-CM | POA: Diagnosis not present

## 2018-07-02 MED ORDER — METOPROLOL SUCCINATE ER 25 MG PO TB24: 75.0000 mg | ORAL_TABLET | Freq: Every day | ORAL | 3 refills | Status: DC

## 2018-07-02 MED ORDER — METOPROLOL SUCCINATE ER 50 MG PO TB24: 75.0000 mg | ORAL_TABLET | Freq: Every day | ORAL | 3 refills | Status: DC

## 2018-07-02 NOTE — Assessment & Plan Note (Signed)
Mild uncontrolled, for increased metoprolol ER 25 mg to 3 per day

## 2018-07-02 NOTE — Assessment & Plan Note (Signed)
stable overall by history and exam, recent data reviewed with pt, and pt to continue medical treatment as before,  to f/u any worsening symptoms or concerns, to cont low chol diet, declines statin

## 2018-07-02 NOTE — Progress Notes (Signed)
Subjective:    Patient ID: Hannah Cain, female    DOB: Dec 15, 1970, 48 y.o.   MRN: [id number]  HPI  Here for wellness and f/u;  Overall doing ok;  Pt denies Chest pain, worsening SOB, DOE, wheezing, orthopnea, PND, worsening LE edema, palpitations, dizziness or syncope.  Pt denies neurological change such as new headache, facial or extremity weakness.  Pt denies polydipsia, polyuria, or low sugar symptoms. Pt states overall good compliance with treatment and medications, good tolerability, and has been trying to follow appropriate diet.  Pt denies worsening depressive symptoms, suicidal ideation or panic. No fever, night sweats, wt loss, loss of appetite, or other constitutional symptoms.  Pt states good ability with ADL's, has low fall risk, home safety reviewed and adequate, no other significant changes in hearing or vision, and only occasionally active with exercise.  BP has been mild elevated at home about 140/90 or slightly higher  Due for Tdap Past Medical History:  Diagnosis Date  . ALLERGIC RHINITIS 03/17/2007  . ANXIETY 03/17/2007   DENIES 05/31/16  . Arthritis   . Basal cell carcinoma (BCC) 06/10/2017  . HLD (hyperlipidemia) 06/10/2017  . Palpitations 11/12/2017  . 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 URI 07/19/2009   Past Surgical History:  Procedure Laterality Date  . BREAST BIOPSY Right 1990  . MOUTH SURGERY      reports that she has never smoked. She has never used smokeless tobacco. She reports that she does not drink alcohol or use drugs. family history includes Breast cancer in her maternal grandmother; COPD in her maternal grandmother; Colon cancer (age of onset: 72) in her brother; Colon polyps in her maternal grandmother; Heart failure in her maternal grandmother; Hyperlipidemia in her maternal grandmother; Hypertension in her maternal grandmother; Liver cancer in her brother; Stroke in her maternal grandmother. No Known Allergies No current outpatient medications on file prior to visit.    No current facility-administered medications on file prior to visit.    Review of Systems Constitutional: Negative for other unusual diaphoresis, sweats, appetite or weight changes HENT: Negative for other worsening hearing loss, ear pain, facial swelling, mouth sores or neck stiffness.   Eyes: Negative for other worsening pain, redness or other visual disturbance.  Respiratory: Negative for other stridor or swelling Cardiovascular: Negative for other palpitations or other chest pain  Gastrointestinal: Negative for worsening diarrhea or loose stools, blood in stool, distention or other pain Genitourinary: Negative for hematuria, flank pain or other change in urine volume.  Musculoskeletal: Negative for myalgias or other joint swelling.  Skin: Negative for other color change, or other wound or worsening drainage.  Neurological: Negative for other syncope or numbness. Hematological: Negative for other adenopathy or swelling Psychiatric/Behavioral: Negative for hallucinations, other worsening agitation, SI, self-injury, or new decreased concentration All other system neg per pt    Objective:   Physical Exam BP 126/76   Pulse 80   Temp 98.8 F (37.1 C) (Oral)   Ht 5' 6.75" (1.695 m)   Wt 219 lb (99.3 kg)   SpO2 97%   BMI 34.56 kg/m  VS noted,  Constitutional: Pt is oriented to person, place, and time. Appears well-developed and well-nourished, in no significant distress and comfortable Head: Normocephalic and atraumatic  Eyes: Conjunctivae and EOM are normal. Pupils are equal, round, and reactive to light Right Ear: External ear normal without discharge Left Ear: External ear normal without discharge Nose: Nose without discharge or deformity Mouth/Throat: Oropharynx is without other ulcerations and moist  Neck: Normal range of motion. Neck supple. No JVD present. No tracheal deviation present or significant neck LA or mass Cardiovascular: Normal rate, regular rhythm, normal heart  sounds and intact distal pulses.   Pulmonary/Chest: WOB normal and breath sounds without rales or wheezing  Abdominal: Soft. Bowel sounds are normal. NT. No HSM  Musculoskeletal: Normal range of motion. Exhibits no edema Lymphadenopathy: Has no other cervical adenopathy.  Neurological: Pt is alert and oriented to person, place, and time. Pt has normal reflexes. No cranial nerve deficit. Motor grossly intact, Gait intact Skin: Skin is warm and dry. No rash noted or new ulcerations Psychiatric:  Has normal mood and affect. Behavior is normal without agitation No other exam findings Lab Results  Component Value Date   WBC 6.4 06/24/2018   HGB 14.3 06/24/2018   HCT 42.5 06/24/2018   PLT 258.0 06/24/2018   GLUCOSE 89 06/24/2018   CHOL 200 06/24/2018   TRIG 70.0 06/24/2018   HDL 61.00 06/24/2018   LDLCALC 125 (H) 06/24/2018   ALT 19 06/24/2018   AST 16 06/24/2018   NA 137 06/24/2018   K 3.6 06/24/2018   CL 101 06/24/2018   CREATININE 0.76 06/24/2018   BUN 14 06/24/2018   CO2 27 06/24/2018   TSH 3.05 06/24/2018       Assessment & Plan:

## 2018-07-02 NOTE — Patient Instructions (Signed)
You had the Tdap tetanus shot today  OK to increase the metoprolol to total 75 mg (3 x 25 mg per day)  Please continue all other medications as before, and refills have been done if requested.  Please have the pharmacy call with any other refills you may need.  Please continue your efforts at being more active, low cholesterol diet, and weight control.  You are otherwise up to date with prevention measures today.  Please keep your appointments with your specialists as you may have planned  Please return in 6 months, or sooner if needed, with Lab testing done 3-5 days before

## 2018-07-02 NOTE — Assessment & Plan Note (Signed)

## 2018-07-17 ENCOUNTER — Encounter: Payer: Self-pay | Admitting: Internal Medicine

## 2018-11-19 ENCOUNTER — Ambulatory Visit (INDEPENDENT_AMBULATORY_CARE_PROVIDER_SITE_OTHER): Payer: BC Managed Care – PPO | Admitting: Internal Medicine

## 2018-11-19 ENCOUNTER — Encounter: Payer: Self-pay | Admitting: Internal Medicine

## 2018-11-19 DIAGNOSIS — I10 Essential (primary) hypertension: Secondary | ICD-10-CM

## 2018-11-19 DIAGNOSIS — R197 Diarrhea, unspecified: Secondary | ICD-10-CM

## 2018-11-19 DIAGNOSIS — Z20822 Contact with and (suspected) exposure to covid-19: Secondary | ICD-10-CM

## 2018-11-19 DIAGNOSIS — Z20828 Contact with and (suspected) exposure to other viral communicable diseases: Secondary | ICD-10-CM | POA: Diagnosis not present

## 2018-11-19 DIAGNOSIS — E785 Hyperlipidemia, unspecified: Secondary | ICD-10-CM

## 2018-11-19 DIAGNOSIS — J069 Acute upper respiratory infection, unspecified: Secondary | ICD-10-CM | POA: Diagnosis not present

## 2018-11-19 MED ORDER — DIPHENOXYLATE-ATROPINE 2.5-0.025 MG PO TABS: 1.0000 | ORAL_TABLET | Freq: Four times a day (QID) | ORAL | 1 refills | Status: DC | PRN

## 2018-11-19 MED ORDER — LEVOFLOXACIN 500 MG PO TABS: 500.0000 mg | ORAL_TABLET | Freq: Every day | ORAL | 0 refills | Status: AC

## 2018-11-19 NOTE — Progress Notes (Signed)
Patient ID: Hannah Cain, female   DOB: May 12, 1970, 48 y.o.   MRN: [id number]  Virtual Visit via Video Note  I connected with Juventino Slovak on 11/19/18 at  3:40 PM EDT by a video enabled telemedicine application and verified that I am speaking with the correct person using two identifiers.  Location: Patient: at home Provider: at office   I discussed the limitations of evaluation and management by telemedicine and the availability of in person appointments. The patient expressed understanding and agreed to proceed.  History of Present Illness:  Here with 2-3 days acute onset fever, facial pain, pressure, headache, general weakness and malaise, and greenish d/c, with mild ST and cough, but pt denies chest pain, wheezing, increased sob or doe, orthopnea, PND, increased LE swelling, palpitations, dizziness or syncope.  Also had 1 mo intermittent watery diarheal stools sometimes green assoc with crampy mild abd pains.  Is on new metoprolol and BP < 140/90 at home.   Past Medical History:  Diagnosis Date  . ALLERGIC RHINITIS 03/17/2007  . ANXIETY 03/17/2007   DENIES 05/31/16  . Arthritis   . Basal cell carcinoma (BCC) 06/10/2017  . HLD (hyperlipidemia) 06/10/2017  . Palpitations 11/12/2017  . 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 URI 07/19/2009   Past Surgical History:  Procedure Laterality Date  . BREAST BIOPSY Right 1990  . MOUTH SURGERY      reports that she has never smoked. She has never used smokeless tobacco. She reports that she does not drink alcohol or use drugs. family history includes Breast cancer in her maternal grandmother; COPD in her maternal grandmother; Colon cancer (age of onset: 60) in her brother; Colon polyps in her maternal grandmother; Heart failure in her maternal grandmother; Hyperlipidemia in her maternal grandmother; Hypertension in her maternal grandmother; Liver cancer in her brother; Stroke in her maternal grandmother. No Known Allergies Current Outpatient Medications on File  Prior to Visit  Medication Sig Dispense Refill  . metoprolol succinate (TOPROL-XL) 25 MG 24 hr tablet Take 3 tablets (75 mg total) by mouth daily. Take with or immediately following a meal. 135 tablet 3   No current facility-administered medications on file prior to visit.     Observations/Objective: Alert, NAD, appropriate mood and affect, resps normal, cn 2-12 intact, moves all 4s, no visible rash or swelling  Lab Results  Component Value Date   WBC 6.4 06/24/2018   HGB 14.3 06/24/2018   HCT 42.5 06/24/2018   PLT 258.0 06/24/2018   GLUCOSE 89 06/24/2018   CHOL 200 06/24/2018   TRIG 70.0 06/24/2018   HDL 61.00 06/24/2018   LDLCALC 125 (H) 06/24/2018   ALT 19 06/24/2018   AST 16 06/24/2018   NA 137 06/24/2018   K 3.6 06/24/2018   CL 101 06/24/2018   CREATININE 0.76 06/24/2018   BUN 14 06/24/2018   CO2 27 06/24/2018   TSH 3.05 06/24/2018   Assessment and Plan: See notes  Follow Up Instructions: See notes   I discussed the assessment and treatment plan with the patient. The patient was provided an opportunity to ask questions and all were answered. The patient agreed with the plan and demonstrated an understanding of the instructions.   The patient was advised to call back or seek an in-person evaluation if the symptoms worsen or if the condition fails to improve as anticipated.   Cathlean Cower, MD

## 2018-11-19 NOTE — Patient Instructions (Addendum)
Please take all new medication as prescribed  - the antibiotic, and lomotil for watery stools  Please go to the [facility] for COVID testing tomorrow  Please continue all other medications as before, and refills have been done if requested.  Please have the pharmacy call with any other refills you may need.  Please keep your appointments with your specialists as you may have planned

## 2018-11-20 ENCOUNTER — Other Ambulatory Visit: Payer: Self-pay

## 2018-11-20 DIAGNOSIS — Z20822 Contact with and (suspected) exposure to covid-19: Secondary | ICD-10-CM

## 2018-11-22 ENCOUNTER — Encounter: Payer: Self-pay | Admitting: Internal Medicine

## 2018-11-22 DIAGNOSIS — J069 Acute upper respiratory infection, unspecified: Secondary | ICD-10-CM | POA: Insufficient documentation

## 2018-11-22 LAB — NOVEL CORONAVIRUS, NAA: SARS-CoV-2, NAA: NOT DETECTED

## 2018-11-22 NOTE — Assessment & Plan Note (Signed)
Suspect IBS like, for limited lomotil prn,  to f/u any worsening symptoms or concerns

## 2018-11-22 NOTE — Assessment & Plan Note (Signed)
Improved, now stable overall by history and exam, recent data reviewed with pt, and pt to continue medical treatment as before,  to f/u any worsening symptoms or concerns

## 2018-11-22 NOTE — Assessment & Plan Note (Signed)
stable overall by history and exam, recent data reviewed with pt, and pt to continue medical treatment as before,  to f/u any worsening symptoms or concerns,, pt for lab f/u at the [facility] lab if covid neg

## 2018-11-22 NOTE — Assessment & Plan Note (Signed)
Mild to mod, for antibx course,  to f/u any worsening symptoms or concerns, also for COVID testing

## 2018-12-21 ENCOUNTER — Telehealth: Payer: Self-pay

## 2018-12-21 DIAGNOSIS — Z20822 Contact with and (suspected) exposure to covid-19: Secondary | ICD-10-CM

## 2018-12-21 DIAGNOSIS — Z20828 Contact with and (suspected) exposure to other viral communicable diseases: Secondary | ICD-10-CM

## 2018-12-21 NOTE — Telephone Encounter (Signed)
Order done

## 2018-12-21 NOTE — Telephone Encounter (Signed)
Copied from [place] [id number]. Topic: General - Other >> Dec 21, 2018  2:31 PM Izola Price, [place] A wrote: Patient would like an order placed for the covid antibody testing. Patient would like a callback.

## 2018-12-21 NOTE — Addendum Note (Signed)
Addended by: Biagio Borg on: 12/21/2018 02:57 PM   Modules accepted: Orders

## 2018-12-24 ENCOUNTER — Other Ambulatory Visit (INDEPENDENT_AMBULATORY_CARE_PROVIDER_SITE_OTHER): Payer: BC Managed Care – PPO

## 2018-12-24 DIAGNOSIS — I10 Essential (primary) hypertension: Secondary | ICD-10-CM

## 2018-12-24 DIAGNOSIS — E785 Hyperlipidemia, unspecified: Secondary | ICD-10-CM | POA: Diagnosis not present

## 2018-12-24 LAB — BASIC METABOLIC PANEL
BUN: 14 mg/dL (ref 6–23)
CO2: 23 mEq/L (ref 19–32)
Calcium: 9.3 mg/dL (ref 8.4–10.5)
Chloride: 103 mEq/L (ref 96–112)
Creatinine, Ser: 0.84 mg/dL (ref 0.40–1.20)
GFR: 72.51 mL/min (ref >60.00)
Glucose, Bld: 84 mg/dL (ref 70–99)
Potassium: 3.5 mEq/L (ref 3.5–5.1)
Sodium: 137 mEq/L (ref 135–145)

## 2018-12-24 LAB — CBC WITH DIFFERENTIAL/PLATELET
Basophils Absolute: 0 10*3/uL (ref 0.0–0.1)
Basophils Relative: 0.4 % (ref 0.0–3.0)
Eosinophils Absolute: 0.1 10*3/uL (ref 0.0–0.7)
Eosinophils Relative: 2.3 % (ref 0.0–5.0)
HCT: 41 % (ref 36.0–46.0)
Hemoglobin: 13.7 g/dL (ref 12.0–15.0)
Lymphocytes Relative: 31 % (ref 12.0–46.0)
Lymphs Abs: 2 10*3/uL (ref 0.7–4.0)
MCHC: 33.4 g/dL (ref 30.0–36.0)
MCV: 89.7 fl (ref 78.0–100.0)
Monocytes Absolute: 0.8 10*3/uL (ref 0.1–1.0)
Monocytes Relative: 12.5 % — ABNORMAL HIGH (ref 3.0–12.0)
Neutro Abs: 3.4 10*3/uL (ref 1.4–7.7)
Neutrophils Relative %: 53.8 % (ref 43.0–77.0)
Platelets: 216 10*3/uL (ref 150.0–400.0)
RBC: 4.57 Mil/uL (ref 3.87–5.11)
RDW: 13 % (ref 11.5–15.5)
WBC: 6.3 10*3/uL (ref 4.0–10.5)

## 2018-12-24 LAB — LIPID PANEL
Cholesterol: 169 mg/dL (ref 0–200)
HDL: 64.5 mg/dL (ref >39.00)
LDL Cholesterol: 92 mg/dL (ref 0–99)
NonHDL: 104.26
Total CHOL/HDL Ratio: 3
Triglycerides: 63 mg/dL (ref 0.0–149.0)
VLDL: 12.6 mg/dL (ref 0.0–40.0)

## 2018-12-31 ENCOUNTER — Other Ambulatory Visit: Payer: Self-pay

## 2018-12-31 ENCOUNTER — Ambulatory Visit (INDEPENDENT_AMBULATORY_CARE_PROVIDER_SITE_OTHER): Payer: BC Managed Care – PPO | Admitting: Internal Medicine

## 2018-12-31 ENCOUNTER — Encounter: Payer: Self-pay | Admitting: Internal Medicine

## 2018-12-31 VITALS — BP 128/78 | HR 83 | Temp 98.1°F | Ht 66.75 in | Wt 207.0 lb

## 2018-12-31 DIAGNOSIS — R29818 Other symptoms and signs involving the nervous system: Secondary | ICD-10-CM

## 2018-12-31 DIAGNOSIS — I493 Ventricular premature depolarization: Secondary | ICD-10-CM

## 2018-12-31 DIAGNOSIS — E538 Deficiency of other specified B group vitamins: Secondary | ICD-10-CM

## 2018-12-31 DIAGNOSIS — I10 Essential (primary) hypertension: Secondary | ICD-10-CM

## 2018-12-31 DIAGNOSIS — Z23 Encounter for immunization: Secondary | ICD-10-CM | POA: Diagnosis not present

## 2018-12-31 DIAGNOSIS — Z0000 Encounter for general adult medical examination without abnormal findings: Secondary | ICD-10-CM

## 2018-12-31 DIAGNOSIS — E785 Hyperlipidemia, unspecified: Secondary | ICD-10-CM

## 2018-12-31 DIAGNOSIS — R202 Paresthesia of skin: Secondary | ICD-10-CM

## 2018-12-31 DIAGNOSIS — E559 Vitamin D deficiency, unspecified: Secondary | ICD-10-CM

## 2018-12-31 DIAGNOSIS — E611 Iron deficiency: Secondary | ICD-10-CM

## 2018-12-31 NOTE — Assessment & Plan Note (Signed)
Unusual presentation, ok for MRI head, consider neurology referral  Note:  Total time for pt hx, exam, review of record with pt in the room, determination of diagnoses and plan for further eval and tx is > 40 min, with over 50% spent in coordination and counseling of patient including the differential dx, tx, further evaluation and other management of paresthesias to the left side, symptomatic pvc, HTn, HLD

## 2018-12-31 NOTE — Assessment & Plan Note (Signed)
Per pt not improved symptomatically with BB, o/w asympt, cont to follow for now

## 2018-12-31 NOTE — Assessment & Plan Note (Signed)
stable overall by history and exam, recent data reviewed with pt, and pt to continue medical treatment as before,  to f/u any worsening symptoms or concerns  

## 2018-12-31 NOTE — Assessment & Plan Note (Addendum)
?   Over controlled, ok to stop the toprol xl 50 and check BP at home on regular basis, otherwise stable overall by history and exam, recent data reviewed with pt, and pt to continue medical treatment as before,  to f/u any worsening symptoms or concerns

## 2018-12-31 NOTE — Progress Notes (Signed)
Subjective:    Patient ID: Hannah Cain, female    DOB: 10/11/1970, 48 y.o.   MRN: [id number]  HPI   Here to f/u; overall doing ok,  Pt denies chest pain, increasing sob or doe, wheezing, orthopnea, PND, increased LE swelling, dizziness or syncope though still has palps not helped with BB.  Pt denies new neurological symptoms such as new headache, or facial or extremity weakness.  Pt denies polydipsia, polyuria, or low sugar episode.  Pt states overall good compliance with meds  Still with persistent left sided numbness pins and needles to start with , better, then worse again with numb pressure like feeling to left lateral leg, left arm, left face and sometimes tongue.  Did have  Chronic constipation but then had a 5 wks hx of diarrhea, now resolved. Denies worsening depressive symptoms, suicidal ideation, or panic; has ongoing anxiety, not increased recently. Last colonoscopy 2018, due for fu 2023. [facility]en at [facility] during the diarrhea no infection. COVID neg.  Walks 2-4 mile sper day, now working at home with signfiicant wt loss, and now off birth control.  Really working on the low chol diet as well.   Wt Readings from Last 3 Encounters:  12/31/18 207 lb (93.9 kg)  07/02/18 219 lb (99.3 kg)  06/11/18 223 lb (101.2 kg)  Would like to come off BB due to the above, at least as trial. Last echo 2019 with normal EF and no diast dysfxn. Past Medical History:  Diagnosis Date  . ALLERGIC RHINITIS 03/17/2007  . ANXIETY 03/17/2007   DENIES 05/31/16  . Arthritis   . Basal cell carcinoma (BCC) 06/10/2017  . HLD (hyperlipidemia) 06/10/2017  . Palpitations 11/12/2017  . 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 URI 07/19/2009   Past Surgical History:  Procedure Laterality Date  . BREAST BIOPSY Right 1990  . MOUTH SURGERY      reports that she has never smoked. She has never used smokeless tobacco. She reports that she does not drink alcohol or use drugs. family history includes Breast cancer in her maternal  grandmother; COPD in her maternal grandmother; Colon cancer (age of onset: 69) in her brother; Colon polyps in her maternal grandmother; Heart failure in her maternal grandmother; Hyperlipidemia in her maternal grandmother; Hypertension in her maternal grandmother; Liver cancer in her brother; Stroke in her maternal grandmother. No Known Allergies Current Outpatient Medications on File Prior to Visit  Medication Sig Dispense Refill  . diphenoxylate-atropine (LOMOTIL) 2.5-0.025 MG tablet Take 1 tablet by mouth 4 (four) times daily as needed for diarrhea or loose stools. 30 tablet 1  . metoprolol succinate (TOPROL-XL) 25 MG 24 hr tablet Take 3 tablets (75 mg total) by mouth daily. Take with or immediately following a meal. 135 tablet 3   No current facility-administered medications on file prior to visit.     Review of Systems  Constitutional: Negative for other unusual diaphoresis or sweats HENT: Negative for ear discharge or swelling Eyes: Negative for other worsening visual disturbances Respiratory: Negative for stridor or other swelling  Gastrointestinal: Negative for worsening distension or other blood Genitourinary: Negative for retention or other urinary change Musculoskeletal: Negative for other MSK pain or swelling Skin: Negative for color change or other new lesions Neurological: Negative for worsening tremors and other numbness  Psychiatric/Behavioral: Negative for worsening agitation or other fatigue All otherwise neg per pt    Objective:   Physical Exam BP 128/78   Pulse 83   Temp 98.1 F (36.7  C) (Oral)   Ht 5' 6.75" (1.695 m)   Wt 207 lb (93.9 kg)   SpO2 99%   BMI 32.66 kg/m  VS noted,  Constitutional: Pt appears in NAD HENT: Head: NCAT.  Right Ear: External ear normal.  Left Ear: External ear normal.  Eyes: . Pupils are equal, round, and reactive to light. Conjunctivae and EOM are normal Nose: without d/c or deformity Neck: Neck supple. Gross normal ROM  Cardiovascular: Normal rate and regular rhythm.   Pulmonary/Chest: Effort normal and breath sounds without rales or wheezing.  Abd:  Soft, NT, ND, + BS, no organomegaly Neurological: Pt is alert. At baseline orientation, motor grossly intact Skin: Skin is warm. No rashes, other new lesions, no LE edema Psychiatric: Pt behavior is normal without agitation  All otherwise neg per pt Lab Results  Component Value Date   WBC 6.3 12/24/2018   HGB 13.7 12/24/2018   HCT 41.0 12/24/2018   PLT 216.0 12/24/2018   GLUCOSE 84 12/24/2018   CHOL 169 12/24/2018   TRIG 63.0 12/24/2018   HDL 64.50 12/24/2018   LDLCALC 92 12/24/2018   ALT 19 06/24/2018   AST 16 06/24/2018   NA 137 12/24/2018   K 3.5 12/24/2018   CL 103 12/24/2018   CREATININE 0.84 12/24/2018   BUN 14 12/24/2018   CO2 23 12/24/2018   TSH 3.05 06/24/2018      Assessment & Plan:

## 2018-12-31 NOTE — Patient Instructions (Addendum)
You had the flu shot today  OK to try off the metoprolol for now  Please call in 2 wks if your BP is more often than no over 140.90  Please continue all other medications as before, and refills have been done if requested.  Please have the pharmacy call with any other refills you may need.  Please continue your efforts at being more active, low cholesterol diet, and weight control.  You are otherwise up to date with prevention measures today.  Please keep your appointments with your specialists as you may have planned  You will be contacted regarding the referral for: MRI  Please return in 6 months, or sooner if needed, with Lab testing done 3-5 days before

## 2019-01-20 ENCOUNTER — Other Ambulatory Visit: Payer: Self-pay

## 2019-01-20 ENCOUNTER — Encounter: Payer: Self-pay | Admitting: Internal Medicine

## 2019-01-20 ENCOUNTER — Ambulatory Visit
Admission: RE | Admit: 2019-01-20 | Discharge: 2019-01-20 | Disposition: A | Discharge status: Home / Self Care | Payer: BC Managed Care – PPO | Source: Ambulatory Visit | Attending: Internal Medicine | Admitting: Internal Medicine

## 2019-01-20 DIAGNOSIS — R202 Paresthesia of skin: Secondary | ICD-10-CM

## 2019-01-20 DIAGNOSIS — R29818 Other symptoms and signs involving the nervous system: Secondary | ICD-10-CM

## 2019-01-25 ENCOUNTER — Encounter: Payer: Self-pay | Admitting: Neurology

## 2019-03-01 ENCOUNTER — Encounter: Payer: Self-pay | Admitting: Neurology

## 2019-03-01 ENCOUNTER — Other Ambulatory Visit (INDEPENDENT_AMBULATORY_CARE_PROVIDER_SITE_OTHER): Payer: BC Managed Care – PPO

## 2019-03-01 ENCOUNTER — Ambulatory Visit: Payer: BC Managed Care – PPO | Admitting: Neurology

## 2019-03-01 ENCOUNTER — Other Ambulatory Visit: Payer: Self-pay

## 2019-03-01 VITALS — BP 140/80 | HR 82 | Ht 67.0 in | Wt 205.0 lb

## 2019-03-01 DIAGNOSIS — R202 Paresthesia of skin: Secondary | ICD-10-CM

## 2019-03-01 DIAGNOSIS — E538 Deficiency of other specified B group vitamins: Secondary | ICD-10-CM

## 2019-03-01 DIAGNOSIS — Z20822 Contact with and (suspected) exposure to covid-19: Secondary | ICD-10-CM

## 2019-03-01 MED ORDER — DIAZEPAM 5 MG PO TABS: ORAL_TABLET | ORAL | 0 refills | Status: DC

## 2019-03-01 NOTE — Patient Instructions (Signed)
Please contact imaging center to schedule MRI.   You can take valium 5mg  30-min prior to your appointment.  Be sure you take someone to drive you  After your MRI has been completed, please call my office so I will know to review the images.  We will check vitamin B12 level today.

## 2019-03-01 NOTE — Progress Notes (Signed)
[facility] Clinic Note - Initial Visit   Date: 03/01/19  Hannah Cain MRN: [id number] DOB: [birth year]   Dear Dr. Jenny Reichmann:  Thank you for your kind referral of Hannah Cain for consultation of left side paresthesias. Although her history is well known to you, please allow Hannah Cain to reiterate it for the purpose of our medical record. The patient was accompanied to the clinic by self.    History of Present Illness: Hannah Cain is a 48 y.o. right-handed female with hypertension presenting for evaluation of left side numbness/tingling.   Starting around July 2020, she developed numbness/tingling over the left hip and thigh which was constant.  A week later, she noticed numbness extending into the left lower leg, arm, and face.  There was no symptoms involving the right side.  No weakness of left side.  Symptoms slowly improved after two weeks.  However, since then, she has developed achy sensation over the lateral left lower leg.  She has noticed that prolonged sitting, driving, or walking tends to aggravate symptoms.  It is worse when she lays down.  Ibuprofen provides some relief.  No preceding injury or illness.  Her PCP ordered MRI brain wo contrast, however, she was unable to complete it due to claustrophobia.   She has been on healthy lifestyle diet and has lost about 22lb this summer. She works in early education. No prior history of CAD, TIA, or stroke.     Out-side paper records, electronic medical record, and images have been reviewed where available and summarized as:  Lab Results  Component Value Date   TSH 3.05 06/24/2018     Past Medical History:  Diagnosis Date  . ALLERGIC RHINITIS 03/17/2007  . ANXIETY 03/17/2007   DENIES 05/31/16  . Arthritis   . Basal cell carcinoma (BCC) 06/10/2017  . HLD (hyperlipidemia) 06/10/2017  . Palpitations 11/12/2017  . 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 URI 07/19/2009    Past Surgical History:  Procedure Laterality Date  .  BREAST BIOPSY Right 1990  . MOUTH SURGERY       Medications:  Outpatient Encounter Medications as of 03/01/2019  Medication Sig  . metoprolol succinate (TOPROL-XL) 50 MG 24 hr tablet TAKE 1 TABLET (50 MG TOTAL) BY MOUTH DAILY. TAKE WITH OR IMMEDIATELY FOLLOWING A MEAL.  Marland Kitchen Vitamin D, Ergocalciferol, (DRISDOL) 1.25 MG (50000 UT) CAPS capsule Take by mouth.   No facility-administered encounter medications on file as of 03/01/2019.     Allergies: No Known Allergies  Family History: Family History  Problem Relation Age of Onset  . Colon cancer Brother 31       mets to liver  . Liver cancer Brother   . Breast cancer Maternal Grandmother   . Colon polyps Maternal Grandmother   . Stroke Maternal Grandmother   . Hypertension Maternal Grandmother   . Hyperlipidemia Maternal Grandmother   . COPD Maternal Grandmother   . Heart failure Maternal Grandmother     Social History: Social History   Tobacco Use  . Smoking status: Never Smoker  . Smokeless tobacco: Never Used  Substance Use Topics  . Alcohol use: No  . Drug use: No   Social History   Social History Narrative   Right handed   One story home    Review of Systems:  CONSTITUTIONAL: No fevers, chills, night sweats, or weight loss.   EYES: No visual changes or eye pain ENT: No hearing changes.  No history of nose bleeds.  RESPIRATORY: No cough, wheezing and shortness of breath.   CARDIOVASCULAR: Negative for chest pain, and palpitations.   GI: Negative for abdominal discomfort, blood in stools or black stools.  No recent change in bowel habits.   GU:  No history of incontinence.   MUSCLOSKELETAL: No history of joint pain or swelling.  No myalgias.   SKIN: Negative for lesions, rash, and itching.   HEMATOLOGY/ONCOLOGY: Negative for prolonged bleeding, bruising easily, and swollen nodes.  No history of cancer.   ENDOCRINE: Negative for cold or heat intolerance, polydipsia or goiter.   PSYCH:  No depression or anxiety  symptoms.   NEURO: As Above.   Vital Signs:  BP (!) 163/80   Pulse 82   Ht 5\' 7"  (1.702 m)   Wt 205 lb (93 kg)   SpO2 98%   BMI 32.11 kg/m    General Medical Exam:   General:  Well appearing, comfortable.   Eyes/ENT: see cranial nerve examination.   Neck:   No carotid bruits. Respiratory:  Clear to auscultation, good air entry bilaterally.   Cardiac:  Regular rate and rhythm, no murmur.   Extremities:  No deformities, edema, or skin discoloration.  Skin:  No rashes or lesions.   Neurological Exam: MENTAL STATUS including orientation to time, place, person, recent and remote memory, attention span and concentration, language, and fund of knowledge is normal.  Speech is not dysarthric.  CRANIAL NERVES: II:  No visual field defects.  III-IV-VI: Pupils equal round and reactive to light.  Normal conjugate, extra-ocular eye movements in all directions of gaze.  No nystagmus.  No ptosis.   V:  Normal facial sensation.    VII:  Normal facial symmetry and movements.   VIII:  Normal hearing and vestibular function.   IX-X:  Normal palatal movement.   XI:  Normal shoulder shrug and head rotation.   XII:  Normal tongue strength and range of motion, no deviation or fasciculation.  MOTOR:  No atrophy, fasciculations or abnormal movements.  No pronator drift.   Upper Extremity:  Right  Left  Deltoid  5/5   5/5   Biceps  5/5   5/5   Triceps  5/5   5/5   Infraspinatus 5/5  5/5  Medial pectoralis 5/5  5/5  Wrist extensors  5/5   5/5   Wrist flexors  5/5   5/5   Finger extensors  5/5   5/5   Finger flexors  5/5   5/5   Dorsal interossei  5/5   5/5   Abductor pollicis  5/5   5/5   Tone (Ashworth scale)  0  0   Lower Extremity:  Right  Left  Hip flexors  5/5   5/5   Hip extensors  5/5   5/5   Adductor 5/5  5/5  Abductor 5/5  5/5  Knee flexors  5/5   5/5   Knee extensors  5/5   5/5   Dorsiflexors  5/5   5/5   Plantarflexors  5/5   5/5   Toe extensors  5/5   5/5   Toe flexors   5/5   5/5   Tone (Ashworth scale)  0  0   MSRs:  Right        Left                  brachioradialis 2+  2+  biceps 2+  2+  triceps 2+  2+  patellar 2+  2+  ankle jerk 2+  2+  Hoffman no  no  plantar response down  down   SENSORY:  Normal and symmetric perception of light touch, pinprick, vibration, and proprioception.  Romberg's sign absent.   COORDINATION/GAIT: Normal finger-to- nose-finger and heel-to-shin.  Intact rapid alternating movements bilaterally.  Able to rise from a chair without using arms.  Gait narrow based and stable. Tandem and stressed gait intact.    IMPRESSION: Left hemisensory deficits, now resolved. Her exam is normal and reassuring. Given that her symptoms were clearly lateralizing, I recommend that she complete MRI brain wo contrast to evaluate for structural pathology. Valium prescribed to help with claustrophobia.  I will also screen her for B12 deficiency which can manifesting with paresthesias.     Thank you for allowing me to participate in patient's care.  If I can answer any additional questions, I would be pleased to do so.    Sincerely,    Donika K. Posey Pronto, DO

## 2019-03-02 LAB — VITAMIN B12: Vitamin B-12: 279 pg/mL (ref 200–1100)

## 2019-03-02 LAB — NOVEL CORONAVIRUS, NAA: SARS-CoV-2, NAA: NOT DETECTED

## 2019-03-14 ENCOUNTER — Ambulatory Visit
Admission: RE | Admit: 2019-03-14 | Discharge: 2019-03-14 | Disposition: A | Discharge status: Home / Self Care | Payer: BC Managed Care – PPO | Source: Ambulatory Visit | Attending: Internal Medicine | Admitting: Internal Medicine

## 2019-03-14 ENCOUNTER — Other Ambulatory Visit: Payer: Self-pay

## 2019-03-14 DIAGNOSIS — R29818 Other symptoms and signs involving the nervous system: Secondary | ICD-10-CM

## 2019-03-15 ENCOUNTER — Telehealth: Payer: Self-pay | Admitting: Neurology

## 2019-03-15 NOTE — Telephone Encounter (Signed)
fyi

## 2019-03-15 NOTE — Telephone Encounter (Signed)
MRI yesterday and patient said that PCP had ordered it but she was instructed to let you know when this was completed so you could look at the results. Thanks!

## 2019-03-15 NOTE — Telephone Encounter (Signed)
MRI brain was reviewed and is normal. Please inform patient.  Thanks.

## 2019-03-15 NOTE — Telephone Encounter (Signed)
Patient notified of MRI results and verbalizied understood

## 2019-03-29 ENCOUNTER — Other Ambulatory Visit: Payer: BC Managed Care – PPO

## 2019-04-12 ENCOUNTER — Encounter: Payer: Self-pay | Admitting: Internal Medicine

## 2019-04-13 ENCOUNTER — Telehealth: Payer: Self-pay

## 2019-04-13 NOTE — Telephone Encounter (Signed)
Please advise this message

## 2019-04-21 NOTE — Telephone Encounter (Signed)
A user error has taken place: CLOSING ENCOUNTER

## 2019-06-14 ENCOUNTER — Encounter: Payer: Self-pay | Admitting: Internal Medicine

## 2019-06-14 DIAGNOSIS — E039 Hypothyroidism, unspecified: Secondary | ICD-10-CM

## 2019-06-14 DIAGNOSIS — D649 Anemia, unspecified: Secondary | ICD-10-CM

## 2019-06-17 LAB — HM MAMMOGRAPHY

## 2019-06-21 ENCOUNTER — Other Ambulatory Visit: Payer: Self-pay | Admitting: Internal Medicine

## 2019-06-21 DIAGNOSIS — E039 Hypothyroidism, unspecified: Secondary | ICD-10-CM

## 2019-06-21 DIAGNOSIS — E611 Iron deficiency: Secondary | ICD-10-CM

## 2019-06-25 ENCOUNTER — Other Ambulatory Visit: Payer: Self-pay

## 2019-06-25 ENCOUNTER — Other Ambulatory Visit (INDEPENDENT_AMBULATORY_CARE_PROVIDER_SITE_OTHER): Payer: BC Managed Care – PPO

## 2019-06-25 DIAGNOSIS — D649 Anemia, unspecified: Secondary | ICD-10-CM

## 2019-06-25 DIAGNOSIS — Z0000 Encounter for general adult medical examination without abnormal findings: Secondary | ICD-10-CM

## 2019-06-25 DIAGNOSIS — E559 Vitamin D deficiency, unspecified: Secondary | ICD-10-CM

## 2019-06-25 DIAGNOSIS — E611 Iron deficiency: Secondary | ICD-10-CM | POA: Diagnosis not present

## 2019-06-25 DIAGNOSIS — E039 Hypothyroidism, unspecified: Secondary | ICD-10-CM | POA: Diagnosis not present

## 2019-06-25 DIAGNOSIS — E538 Deficiency of other specified B group vitamins: Secondary | ICD-10-CM

## 2019-06-25 LAB — CBC WITH DIFFERENTIAL/PLATELET
Basophils Absolute: 0 10*3/uL (ref 0.0–0.1)
Basophils Relative: 0.6 % (ref 0.0–3.0)
Eosinophils Absolute: 0.4 10*3/uL (ref 0.0–0.7)
Eosinophils Relative: 7.3 % — ABNORMAL HIGH (ref 0.0–5.0)
HCT: 39 % (ref 36.0–46.0)
Hemoglobin: 13.2 g/dL (ref 12.0–15.0)
Lymphocytes Relative: 30.1 % (ref 12.0–46.0)
Lymphs Abs: 1.7 10*3/uL (ref 0.7–4.0)
MCHC: 33.9 g/dL (ref 30.0–36.0)
MCV: 89.2 fl (ref 78.0–100.0)
Monocytes Absolute: 0.6 10*3/uL (ref 0.1–1.0)
Monocytes Relative: 11.7 % (ref 3.0–12.0)
Neutro Abs: 2.8 10*3/uL (ref 1.4–7.7)
Neutrophils Relative %: 50.3 % (ref 43.0–77.0)
Platelets: 227 10*3/uL (ref 150.0–400.0)
RBC: 4.38 Mil/uL (ref 3.87–5.11)
RDW: 13.9 % (ref 11.5–15.5)
WBC: 5.5 10*3/uL (ref 4.0–10.5)

## 2019-06-25 LAB — HEPATIC FUNCTION PANEL
ALT: 12 U/L (ref 0–35)
AST: 15 U/L (ref 0–37)
Albumin: 4 g/dL (ref 3.5–5.2)
Alkaline Phosphatase: 60 U/L (ref 39–117)
Bilirubin, Direct: 0.1 mg/dL (ref 0.0–0.3)
Total Bilirubin: 0.9 mg/dL (ref 0.2–1.2)
Total Protein: 7.2 g/dL (ref 6.0–8.3)

## 2019-06-25 LAB — URINALYSIS, ROUTINE W REFLEX MICROSCOPIC
Bilirubin Urine: NEGATIVE
Hgb urine dipstick: NEGATIVE
Ketones, ur: NEGATIVE
Leukocytes,Ua: NEGATIVE
Nitrite: NEGATIVE
RBC / HPF: NONE SEEN (ref 0–?)
Specific Gravity, Urine: 1.02 (ref 1.000–1.030)
Total Protein, Urine: NEGATIVE
Urine Glucose: NEGATIVE
Urobilinogen, UA: 0.2 (ref 0.0–1.0)
pH: 6 (ref 5.0–8.0)

## 2019-06-25 LAB — BASIC METABOLIC PANEL
BUN: 13 mg/dL (ref 6–23)
CO2: 26 mEq/L (ref 19–32)
Calcium: 9 mg/dL (ref 8.4–10.5)
Chloride: 101 mEq/L (ref 96–112)
Creatinine, Ser: 0.8 mg/dL (ref 0.40–1.20)
GFR: 76.55 mL/min (ref >60.00)
Glucose, Bld: 88 mg/dL (ref 70–99)
Potassium: 3.8 mEq/L (ref 3.5–5.1)
Sodium: 135 mEq/L (ref 135–145)

## 2019-06-25 LAB — TSH: TSH: 3.47 u[IU]/mL (ref 0.35–4.50)

## 2019-06-25 LAB — FERRITIN: Ferritin: 27.9 ng/mL (ref 10.0–291.0)

## 2019-06-25 LAB — LIPID PANEL
Cholesterol: 167 mg/dL (ref 0–200)
HDL: 59.4 mg/dL (ref >39.00)
LDL Cholesterol: 97 mg/dL (ref 0–99)
NonHDL: 107.41
Total CHOL/HDL Ratio: 3
Triglycerides: 54 mg/dL (ref 0.0–149.0)
VLDL: 10.8 mg/dL (ref 0.0–40.0)

## 2019-06-25 LAB — IBC PANEL
Iron: 60 ug/dL (ref 42–145)
Saturation Ratios: 17.6 % — ABNORMAL LOW (ref 20.0–50.0)
Transferrin: 244 mg/dL (ref 212.0–360.0)

## 2019-06-25 LAB — VITAMIN D 25 HYDROXY (VIT D DEFICIENCY, FRACTURES): VITD: 30.55 ng/mL (ref 30.00–100.00)

## 2019-06-25 LAB — FOLATE: Folate: >23.7 ng/mL (ref >5.9)

## 2019-06-25 LAB — T4, FREE: Free T4: 0.9 ng/dL (ref 0.60–1.60)

## 2019-06-25 LAB — VITAMIN B12: Vitamin B-12: 199 pg/mL — ABNORMAL LOW (ref 211–911)

## 2019-06-30 ENCOUNTER — Other Ambulatory Visit: Payer: Self-pay

## 2019-06-30 ENCOUNTER — Encounter: Payer: Self-pay | Admitting: Internal Medicine

## 2019-06-30 ENCOUNTER — Ambulatory Visit: Payer: BC Managed Care – PPO | Admitting: Internal Medicine

## 2019-06-30 VITALS — BP 118/74 | HR 78 | Temp 98.6°F | Ht 67.0 in | Wt 194.0 lb

## 2019-06-30 DIAGNOSIS — I10 Essential (primary) hypertension: Secondary | ICD-10-CM

## 2019-06-30 DIAGNOSIS — R5383 Other fatigue: Secondary | ICD-10-CM | POA: Diagnosis not present

## 2019-06-30 DIAGNOSIS — Z0001 Encounter for general adult medical examination with abnormal findings: Secondary | ICD-10-CM

## 2019-06-30 DIAGNOSIS — E538 Deficiency of other specified B group vitamins: Secondary | ICD-10-CM

## 2019-06-30 DIAGNOSIS — E785 Hyperlipidemia, unspecified: Secondary | ICD-10-CM

## 2019-06-30 DIAGNOSIS — Z0000 Encounter for general adult medical examination without abnormal findings: Secondary | ICD-10-CM

## 2019-06-30 DIAGNOSIS — E559 Vitamin D deficiency, unspecified: Secondary | ICD-10-CM | POA: Diagnosis not present

## 2019-06-30 MED ORDER — CYANOCOBALAMIN 1000 MCG/ML IJ SOLN
1000.0000 ug | Freq: Once | INTRAMUSCULAR | Status: AC
  Administered 2019-06-30: 1000 ug via INTRAMUSCULAR

## 2019-06-30 MED ORDER — VITAMIN D (ERGOCALCIFEROL) 1.25 MG (50000 UNIT) PO CAPS: 50000.0000 [IU] | ORAL_CAPSULE | ORAL | 0 refills | Status: DC

## 2019-06-30 NOTE — Patient Instructions (Signed)
You had the B12 shot today  Please return monthly for the B12 shots with a Nurse Visit (or they can be done at home if you have someone trained to do this)  It is OK to get the second COVID shot even though you are getting the B12 shots  Please take Vitamin D 50000 units weekly for 12 weeks, then plan to change to OTC Vitamin D3 at 2000 units per day, indefinitely.  Please go to the LAB at the blood drawing area for the tests to be done in 6 months - to repeat the B12 and Vit D levels  Please continue all other medications as before, and refills have been done if requested.  Please have the pharmacy call with any other refills you may need.  Please continue your efforts at being more active, low cholesterol diet, and weight control.  You are otherwise up to date with prevention measures today.  Please keep your appointments with your specialists as you may have planned  Please make an Appointment to return for your 1 year visit, or sooner if needed, with Lab testing by Appointment as well, to be done about 3-5 days before at the [facility] (so this is for TWO appointments - please see the scheduling desk as you leave)

## 2019-06-30 NOTE — Assessment & Plan Note (Signed)
Etiology unclear, Exam otherwise benign, to check labs as documented, follow with expectant management  I spent 32 minutes in preparing to see the patient by review of recent labs, imaging and procedures, obtaining and reviewing separately obtained history, communicating with the patient and family or caregiver, ordering medications, tests or procedures, and documenting clinical information in the EHR including the differential Dx, treatment, and any further evaluation and other management of fatigue, low vit b12, vit d and htn

## 2019-06-30 NOTE — Assessment & Plan Note (Signed)

## 2019-06-30 NOTE — Addendum Note (Signed)
Addended by: Biagio Borg on: 06/30/2019 10:01 PM   Modules accepted: Orders

## 2019-06-30 NOTE — Assessment & Plan Note (Signed)
stable overall by history and exam, recent data reviewed with pt, and pt to continue medical treatment as before,  to f/u any worsening symptoms or concerns  

## 2019-06-30 NOTE — Assessment & Plan Note (Signed)
Please take Vitamin D 50000 units weekly for 12 weeks, then plan to change to OTC Vitamin D3 at 2000 units per day, indefinitely. 

## 2019-06-30 NOTE — Assessment & Plan Note (Signed)
For b12 1000 im, then monthly as oral b12 not effective

## 2019-06-30 NOTE — Progress Notes (Signed)
Subjective:    Patient ID: Hannah Cain, female    DOB: 1970/08/20, 49 y.o.   MRN: [id number]  HPI  Here for wellness and f/u;  Overall doing ok;  Pt denies Chest pain, worsening SOB, DOE, wheezing, orthopnea, PND, worsening LE edema, palpitations, dizziness or syncope.  Pt denies neurological change such as new headache, facial or extremity weakness.  Pt denies polydipsia, polyuria, or low sugar symptoms. Pt states overall good compliance with treatment and medications, good tolerability, and has been trying to follow appropriate diet.  Pt denies worsening depressive symptoms, suicidal ideation or panic. No fever, night sweats, wt loss, loss of appetite, or other constitutional symptoms.  Pt states good ability with ADL's, has low fall risk, home safety reviewed and adequate, no other significant changes in hearing or vision, and only occasionally active with exercise. Wt Readings from Last 3 Encounters:  06/30/19 194 lb (88 kg)  03/01/19 205 lb (93 kg)  12/31/18 207 lb (93.9 kg)  Does c/o ongoing fatigue, but denies signficant daytime hypersomnolence. Past Medical History:  Diagnosis Date  . ALLERGIC RHINITIS 03/17/2007  . ANXIETY 03/17/2007   DENIES 05/31/16  . Arthritis   . Basal cell carcinoma (BCC) 06/10/2017  . HLD (hyperlipidemia) 06/10/2017  . Palpitations 11/12/2017  . 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 URI 07/19/2009   Past Surgical History:  Procedure Laterality Date  . BREAST BIOPSY Right 1990  . MOUTH SURGERY      reports that she has never smoked. She has never used smokeless tobacco. She reports that she does not drink alcohol or use drugs. family history includes Breast cancer in her maternal grandmother; COPD in her maternal grandmother; Colon cancer (age of onset: 68) in her brother; Colon polyps in her maternal grandmother; Heart failure in her maternal grandmother; Hyperlipidemia in her maternal grandmother; Hypertension in her maternal grandmother; Liver cancer in her brother;  Stroke in her maternal grandmother. No Known Allergies Current Outpatient Medications on File Prior to Visit  Medication Sig Dispense Refill  . Digestive Enzymes (ENZYME DIGEST PO) Take by mouth.    . Flaxseed, Linseed, (FLAX SEEDS PO) Take by mouth.    . Multiple Vitamin (MULTI VITAMIN DAILY PO) Take by mouth.    . diazepam (VALIUM) 5 MG tablet Take 1 tablet 30-min prior to MRI.  Do not drive while on this medication. (Patient not taking: Reported on 06/30/2019) 2 tablet 0  . metoprolol succinate (TOPROL-XL) 50 MG 24 hr tablet TAKE 1 TABLET (50 MG TOTAL) BY MOUTH DAILY. TAKE WITH OR IMMEDIATELY FOLLOWING A MEAL.     No current facility-administered medications on file prior to visit.   Review of Systems All otherwise neg per pt     Objective:   Physical Exam BP 118/74   Pulse 78   Temp 98.6 F (37 C)   Ht 5\' 7"  (1.702 m)   Wt 194 lb (88 kg)   SpO2 99%   BMI 30.38 kg/m  VS noted,  Constitutional: Pt appears in NAD HENT: Head: NCAT.  Right Ear: External ear normal.  Left Ear: External ear normal.  Eyes: . Pupils are equal, round, and reactive to light. Conjunctivae and EOM are normal Nose: without d/c or deformity Neck: Neck supple. Gross normal ROM Cardiovascular: Normal rate and regular rhythm.   Pulmonary/Chest: Effort normal and breath sounds without rales or wheezing.  Abd:  Soft, NT, ND, + BS, no organomegaly Neurological: Pt is alert. At baseline orientation, motor grossly intact Skin: Skin  is warm. No rashes, other new lesions, no LE edema Psychiatric: Pt behavior is normal without agitation  All otherwise neg per pt Lab Results  Component Value Date   WBC 5.5 06/25/2019   HGB 13.2 06/25/2019   HCT 39.0 06/25/2019   PLT 227.0 06/25/2019   GLUCOSE 88 06/25/2019   CHOL 167 06/25/2019   TRIG 54.0 06/25/2019   HDL 59.40 06/25/2019   LDLCALC 97 06/25/2019   ALT 12 06/25/2019   AST 15 06/25/2019   NA 135 06/25/2019   K 3.8 06/25/2019   CL 101 06/25/2019    CREATININE 0.80 06/25/2019   BUN 13 06/25/2019   CO2 26 06/25/2019   TSH 3.47 06/25/2019      Assessment & Plan:

## 2019-07-30 ENCOUNTER — Telehealth: Payer: Self-pay | Admitting: Internal Medicine

## 2019-07-30 NOTE — Telephone Encounter (Signed)
Patient os attempting to complete forms.  What is the dosage of B12  Injection is patient receiving  Please call

## 2019-08-02 ENCOUNTER — Ambulatory Visit (INDEPENDENT_AMBULATORY_CARE_PROVIDER_SITE_OTHER): Payer: BC Managed Care – PPO | Admitting: *Deleted

## 2019-08-02 ENCOUNTER — Telehealth: Payer: Self-pay | Admitting: *Deleted

## 2019-08-02 ENCOUNTER — Other Ambulatory Visit: Payer: Self-pay

## 2019-08-02 DIAGNOSIS — E538 Deficiency of other specified B group vitamins: Secondary | ICD-10-CM

## 2019-08-02 MED ORDER — CYANOCOBALAMIN 1000 MCG/ML IJ SOLN
1000.0000 ug | Freq: Once | INTRAMUSCULAR | Status: AC
  Administered 2019-08-02: 16:00:00 1000 ug via INTRAMUSCULAR

## 2019-08-02 NOTE — Telephone Encounter (Signed)
Spoke with pt informed her of the dosage amount for the B12 injection.

## 2019-08-02 NOTE — Telephone Encounter (Signed)
Ok this is ordered 

## 2019-08-02 NOTE — Progress Notes (Signed)
Pls cosign for B12 inj../lmb  

## 2019-08-02 NOTE — Telephone Encounter (Signed)
Pt states her nutritionist is wanting her to have her B12 level check after her 3rd or 4th injection. Is this ok to enter order.Marland KitchenJohny Cain

## 2019-08-03 NOTE — Telephone Encounter (Signed)
Notified pt w/MD response. Made lab appt for 10/04/19.Marland KitchenJohny Chess

## 2019-09-01 ENCOUNTER — Ambulatory Visit: Payer: BC Managed Care – PPO

## 2019-09-15 ENCOUNTER — Other Ambulatory Visit: Payer: Self-pay | Admitting: Internal Medicine

## 2019-09-15 NOTE — Telephone Encounter (Signed)
Please let pt know to change to OTC Vitamin D3 at 2000 units per day, indefinitely.  

## 2019-10-04 ENCOUNTER — Ambulatory Visit: Payer: BC Managed Care – PPO

## 2019-10-04 ENCOUNTER — Other Ambulatory Visit: Payer: BC Managed Care – PPO

## 2019-10-10 IMAGING — DX DG CHEST 2V
2 series · 2 of 2 positions shown · non-contrast
Comparison: Chest x-ray of December 04, 2010

CLINICAL DATA: Palpitations intermittently for the past week. No
cardiopulmonary history. Nonsmoker.

EXAM:
CHEST - 2 VIEW

[chest pa]
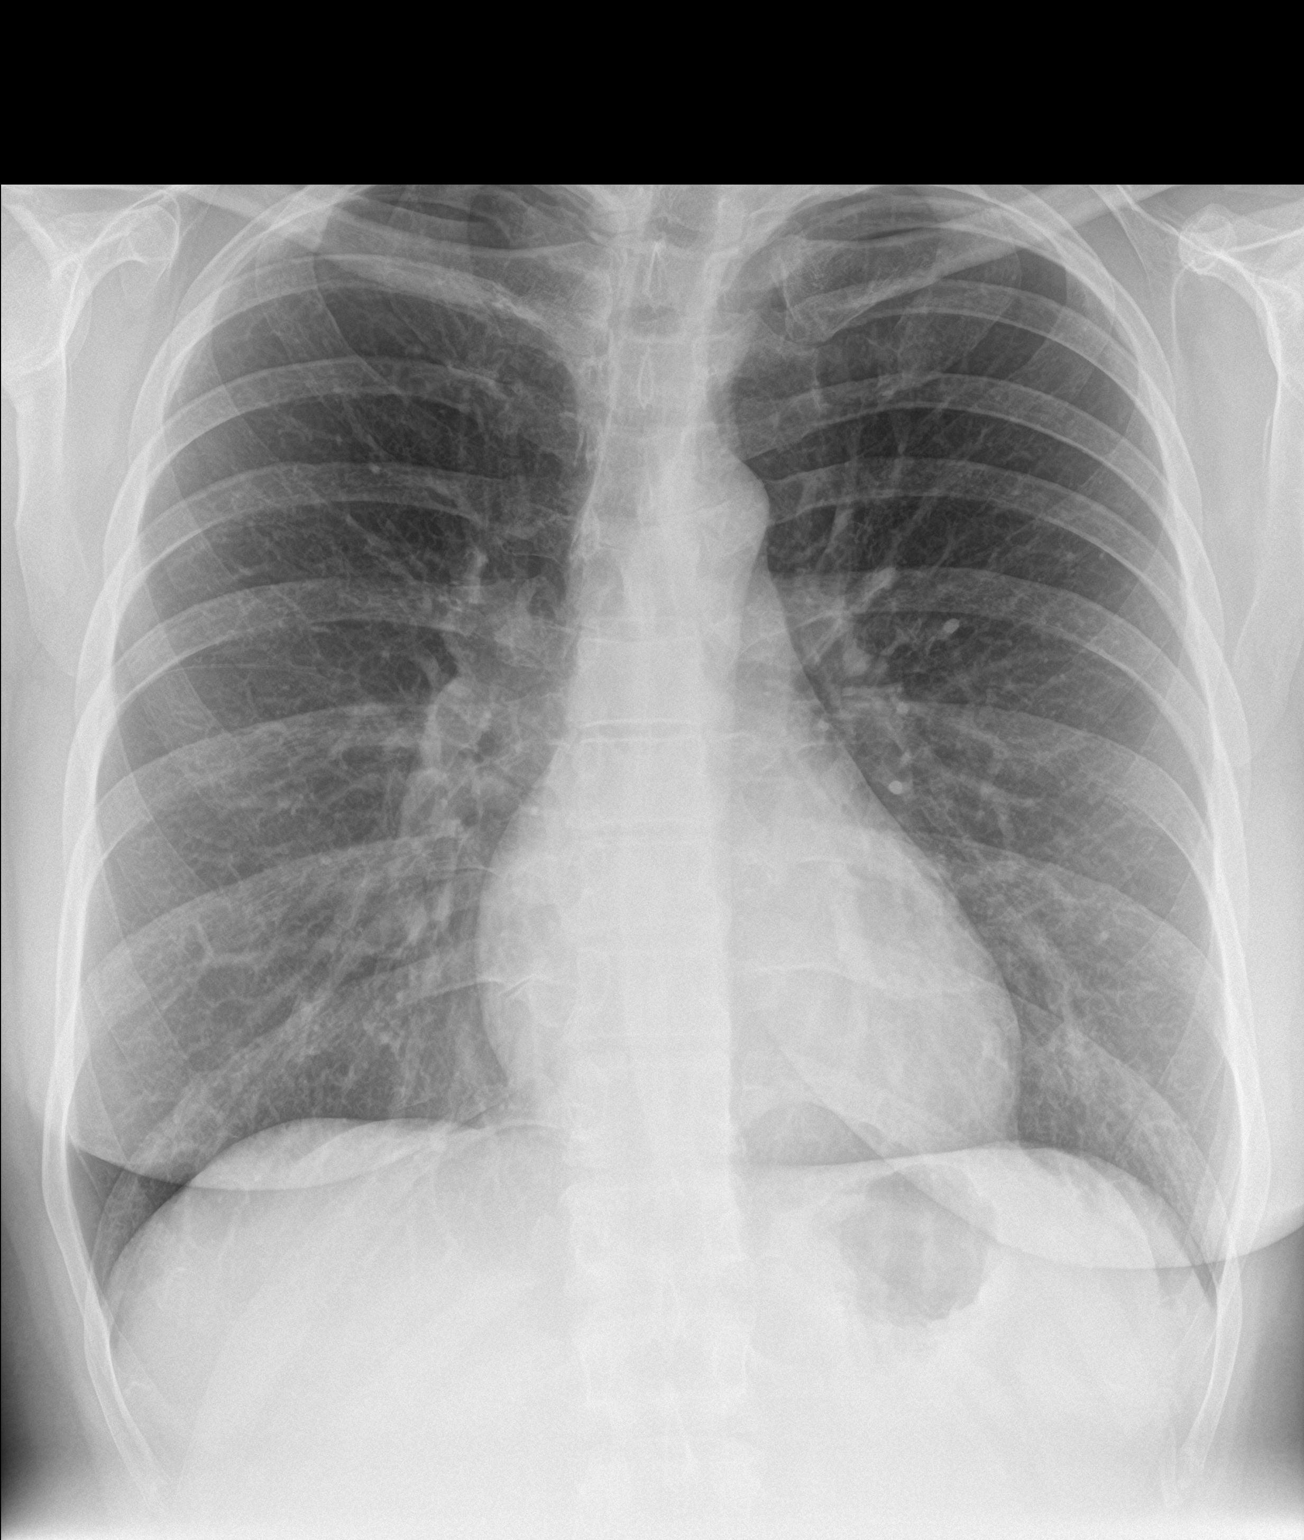

[chest lat]
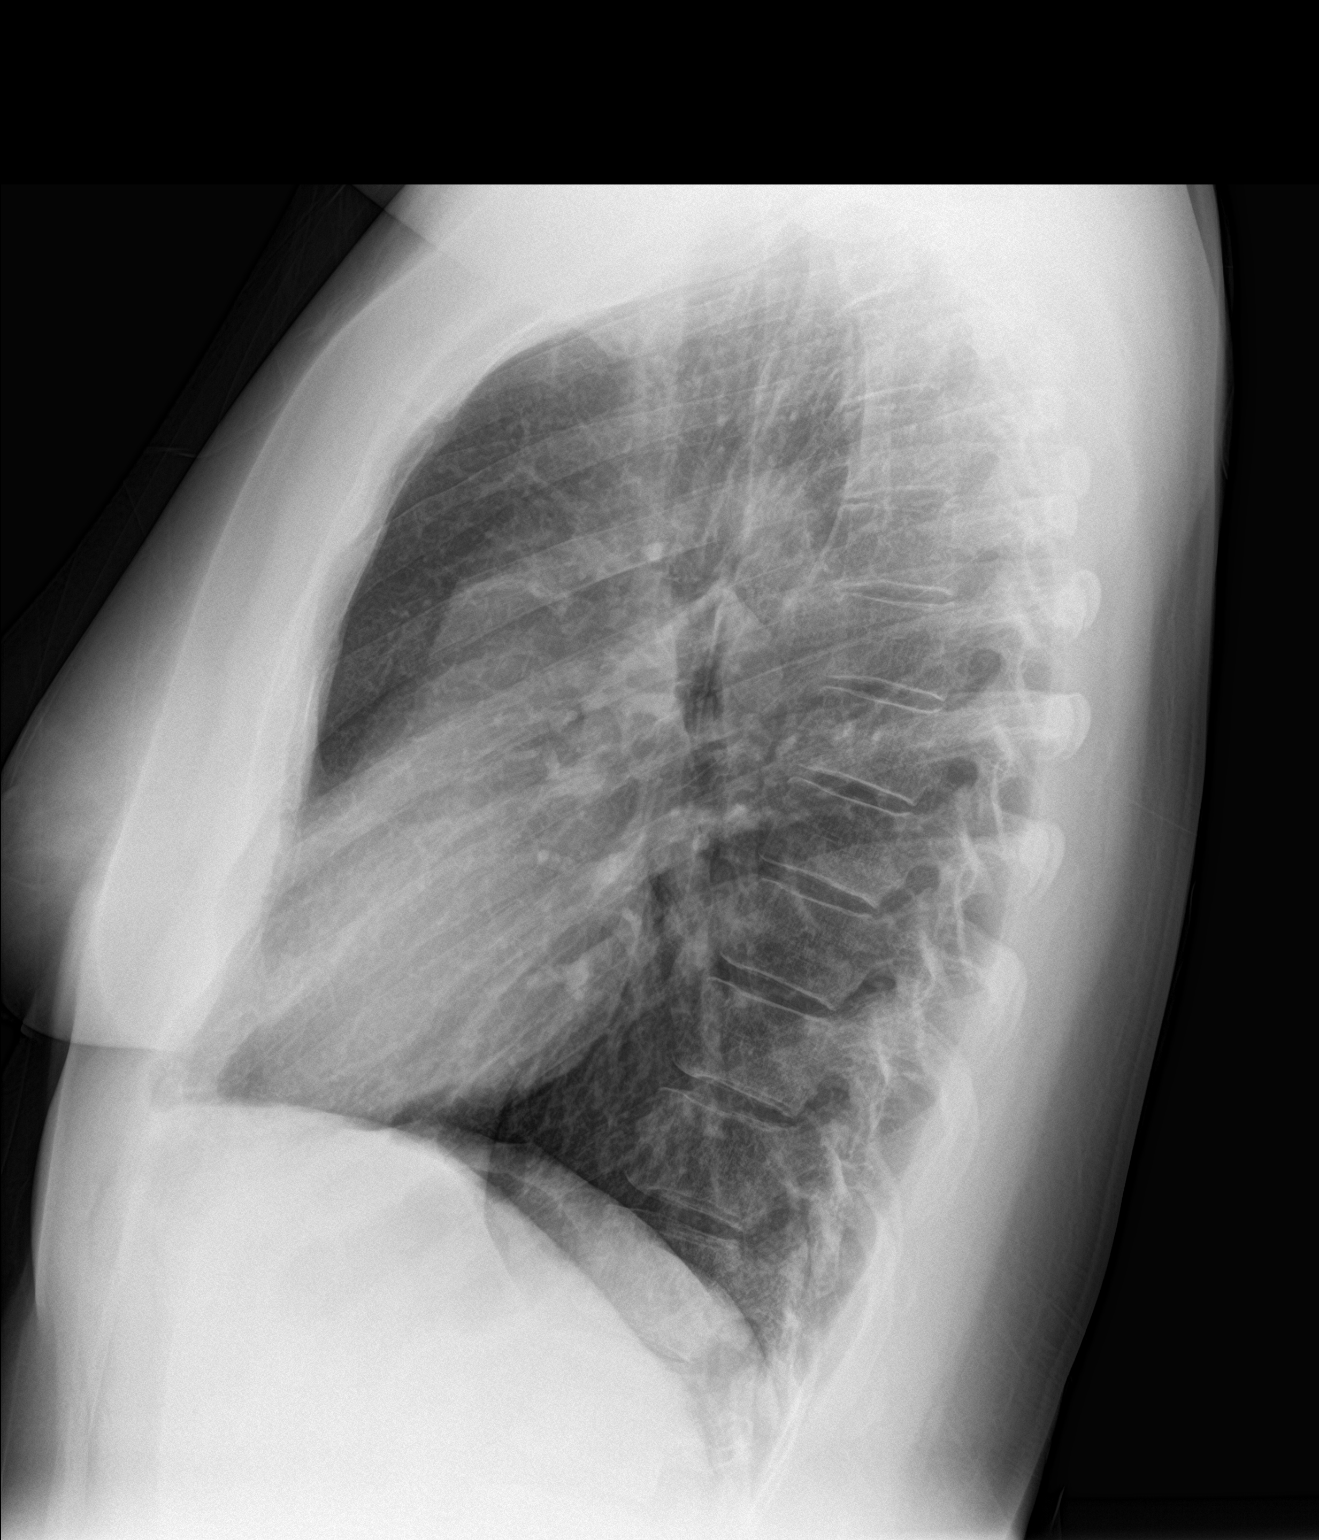

[2 of 2 positions shown; findings below may reference images not displayed]

FINDINGS: The lungs are well-expanded. There is no focal infiltrate. There is
no pleural effusion. The heart and pulmonary vascularity are normal.
The mediastinum is normal in width. The bony thorax is unremarkable.
IMPRESSION: There is no pneumonia, CHF, nor other acute cardiopulmonary
abnormality.

## 2019-12-31 ENCOUNTER — Other Ambulatory Visit: Payer: BC Managed Care – PPO

## 2020-06-23 ENCOUNTER — Other Ambulatory Visit: Payer: BC Managed Care – PPO

## 2020-06-29 ENCOUNTER — Encounter: Payer: Self-pay | Admitting: Internal Medicine

## 2020-06-29 ENCOUNTER — Ambulatory Visit: Payer: BC Managed Care – PPO | Admitting: Internal Medicine

## 2020-08-22 ENCOUNTER — Telehealth: Payer: Self-pay | Admitting: Neurology

## 2020-08-22 NOTE — Telephone Encounter (Signed)
Sleep consult for 5/18 needs r/s due to pt having covid.

## 2020-08-23 ENCOUNTER — Institutional Professional Consult (permissible substitution): Payer: BC Managed Care – PPO | Admitting: Neurology

## 2020-10-18 ENCOUNTER — Encounter: Payer: Self-pay | Admitting: *Deleted

## 2020-10-19 ENCOUNTER — Other Ambulatory Visit: Payer: Self-pay

## 2020-10-19 ENCOUNTER — Ambulatory Visit: Payer: BC Managed Care – PPO | Admitting: Neurology

## 2020-10-19 ENCOUNTER — Encounter: Payer: Self-pay | Admitting: Neurology

## 2020-10-19 VITALS — BP 134/83 | HR 75 | Ht 61.0 in | Wt 211.2 lb

## 2020-10-19 DIAGNOSIS — G478 Other sleep disorders: Secondary | ICD-10-CM

## 2020-10-19 DIAGNOSIS — E669 Obesity, unspecified: Secondary | ICD-10-CM | POA: Diagnosis not present

## 2020-10-19 DIAGNOSIS — E538 Deficiency of other specified B group vitamins: Secondary | ICD-10-CM

## 2020-10-19 DIAGNOSIS — R5383 Other fatigue: Secondary | ICD-10-CM

## 2020-10-19 DIAGNOSIS — R002 Palpitations: Secondary | ICD-10-CM

## 2020-10-19 DIAGNOSIS — R635 Abnormal weight gain: Secondary | ICD-10-CM | POA: Diagnosis not present

## 2020-10-19 NOTE — Progress Notes (Signed)
Subjective:    Patient ID: Hannah Cain is a 50 y.o. female.  HPI    Star Age, MD, PhD [facility] [address], [address] [place], [place] [address]  Dear Dr. Jacalyn Lefevre,   I saw your patient, Hannah Cain, upon your kind request in my sleep clinic today for initial consultation of her sleep disorder, in particular, concern for underlying obstructive sleep apnea.  The patient is unaccompanied today.  As you know, Hannah Cain is a 50 year old right-handed woman with an underlying medical history of neck pain, vitamin D deficiency, B12 deficiency, hypertension, and obesity, who reports daytime somnolence.  I reviewed your office note from 06/15/2020.  Her Epworth sleepiness score is 1 out of 24, fatigue severity score is 35 out of 63.  She was diagnosed with B12 deficiency about a year and a half ago.  She originally started an oral supplement but then started vitamin B12 injections.  Over time, her tiredness and energy level improved.  She is not aware of any snoring, husband does not complain about it.  He does snore and sometimes it disrupts her sleep.  She has a history of sleep talking per husband's feedback.  She does not have night to night nocturia or recurrent morning headaches or family history of sleep apnea.  She has had reflux symptoms including nocturnal reflux and had seen ENT for sore throat, she was prescribed a reflux medication but did not end up taking it.  She started seeing a nutritionist who made some other suggestions and she is working on weight loss.  In the past month or so she has gained about 12 pounds.  When she was originally diagnosed with B12 deficiency she had significant lack of stamina and was not able to exercise in the form of walking quite as much as she used to.  She is slowly building up her walking schedule again.   Bedtime is generally between 9 and 10 PM, rise time around 5:45 AM.  She works for the school system.  She lives with her  husband and has 2 children, 1 in college and one about to start community college.  She is a non-smoker and drinks alcohol rarely, caffeine in the form of soda, currently less than 1/day on average.  She has a TV in the bedroom but does not watch it at night.  They have no pets in the house.   She had blood work through your office on 06/08/2020 and I was able to review the results: CMP showed benign findings, BUN was 12, creatinine 0.7, CBC with differential was benign, lipid profile showed cholesterol of 195, triglycerides 62, HDL 75, LDL 108.  TSH 2.08, free T4 1.1, B12 574.  She has been on monthly B12 injections and takes prescription vitamin D.  Her Past Medical History Is Significant For: Past Medical History:  Diagnosis Date   ALLERGIC RHINITIS 03/17/2007   ANXIETY 03/17/2007   DENIES 05/31/16   Arthritis    Basal cell carcinoma (BCC) 06/10/2017   HLD (hyperlipidemia) 06/10/2017   Palpitations 11/12/2017   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   URI 07/19/2009    Her Past Surgical History Is Significant For: Past Surgical History:  Procedure Laterality Date   BREAST BIOPSY Right 1990   MOUTH SURGERY      Her Family History Is Significant For: Family History  Problem Relation Age of Onset   Hypertension Mother    Hypothyroidism Mother    Vitiligo Mother  Colon cancer Brother 25       mets to liver   Liver cancer Brother    Breast cancer Maternal Grandmother    Colon polyps Maternal Grandmother    Stroke Maternal Grandmother    Hypertension Maternal Grandmother    Hyperlipidemia Maternal Grandmother    COPD Maternal Grandmother    Heart failure Maternal Grandmother     Her Social History Is Significant For: Social History   Socioeconomic History   Marital status: Married    Spouse name: Not on file   Number of children: 2   Years of education: 16   Highest education level: Not on file  Occupational History   Occupation: Armed forces training and education officer  Tobacco Use   Smoking status:  Never   Smokeless tobacco: Never  Vaping Use   Vaping Use: Never used  Substance and Sexual Activity   Alcohol use: No   Drug use: No   Sexual activity: Not on file  Other Topics Concern   Not on file  Social History Narrative   Right handed   One story home   Social Determinants of Health   Financial Resource Strain: Not on file  Food Insecurity: Not on file  Transportation Needs: Not on file  Physical Activity: Not on file  Stress: Not on file  Social Connections: Not on file    Her Allergies Are:  No Known Allergies:   Her Current Medications Are:  Outpatient Encounter Medications as of 10/19/2020  Medication Sig   Cyanocobalamin (VITAMIN B-12 IJ) Inject as directed.   Digestive Enzymes (ENZYME DIGEST PO) Take by mouth.   FLAXSEED, LINSEED, PO Take by mouth.   fluticasone (FLONASE) 50 MCG/ACT nasal spray Place 2 sprays into both nostrils daily.   Olopatadine HCl 0.6 % SOLN olopatadine 0.6 % nasal spray  SPRAY 2 SPRAYS INTO EACH NOSTRIL TWICE A DAY   Vitamin D, Ergocalciferol, (DRISDOL) 1.25 MG (50000 UNIT) CAPS capsule Take 1 capsule (50,000 Units total) by mouth every 7 (seven) days.   [DISCONTINUED] Flaxseed, Linseed, (FLAX SEEDS PO) Take by mouth.   [DISCONTINUED] desloratadine (CLARINEX) 5 MG tablet desloratadine 5 mg tablet   [DISCONTINUED] diazepam (VALIUM) 5 MG tablet Take 1 tablet 30-min prior to MRI.  Do not drive while on this medication. (Patient not taking: Reported on 06/30/2019)   [DISCONTINUED] methocarbamol (ROBAXIN) 500 MG tablet SMARTSIG:1.5 Tablet(s) By Mouth Every 8 Hours   [DISCONTINUED] metoprolol succinate (TOPROL-XL) 50 MG 24 hr tablet TAKE 1 TABLET (50 MG TOTAL) BY MOUTH DAILY. TAKE WITH OR IMMEDIATELY FOLLOWING A MEAL.   [DISCONTINUED] Multiple Vitamin (MULTI VITAMIN DAILY PO) Take by mouth.   [DISCONTINUED] Multiple Vitamins-Minerals (DAILY MULTIVITAMIN) CAPS Take by mouth.   [DISCONTINUED] TOLAK 4 % CREA Apply topically at bedtime as needed.  (Patient not taking: Reported on 10/19/2020)   No facility-administered encounter medications on file as of 10/19/2020.  :   Review of Systems:  Out of a complete 14 point review of systems, all are reviewed and negative with the exception of these symptoms as listed below:  Review of Systems  Neurological:        Here for sleep consult. No prior sleep study. Reports long history of daytime fatigue. PCP thought this could be related to vit b 12 deficiency, but supplements started and fatigue has not improved. Denies any snoring.   Epworth Sleepiness Scale 0= would never doze 1= slight chance of dozing 2= moderate chance of dozing 3= high chance of dozing  Sitting and reading:0  Watching TV:0 Sitting inactive in a public place (ex. Theater or meeting):0 As a passenger in a car for an hour without a break:0 Lying down to rest in the afternoon:1 Sitting and talking to someone:0 Sitting quietly after lunch (no alcohol):0 In a car, while stopped in traffic:0 Total:1    Objective:  Neurological Exam  Physical Exam Physical Examination:   Vitals:   10/19/20 0855  BP: 134/83  Pulse: 75    General Examination: The patient is a very pleasant 50 y.o. female in no acute distress. She appears well-developed and well-nourished and well groomed.   HEENT: Normocephalic, atraumatic, pupils are equal, round and reactive to light, extraocular tracking is good without limitation to gaze excursion or nystagmus noted. Hearing is grossly intact. Face is symmetric with normal facial animation. Speech is clear with no dysarthria noted. There is no hypophonia. There is no lip, neck/head, jaw or voice tremor. Neck is supple with full range of passive and active motion. There are no carotid bruits on auscultation. Oropharynx exam reveals: no significant mouth dryness, good dental hygiene.  Smaller tonsils and uvula noted, Mallampati class I, neck circumference of 13 and three-quarter inches.  She has a  minimal overbite.   Chest: Clear to auscultation without wheezing, rhonchi or crackles noted.  Heart: S1+S2+0, regular and normal without murmurs, rubs or gallops noted.   Abdomen: Soft, non-tender and non-distended.  Extremities: There is no pitting edema in the distal lower extremities bilaterally.   Skin: Warm and dry without trophic changes noted.   Musculoskeletal: exam reveals no obvious joint deformities, pain score medial right distal leg below knee from a childhood injury.   Neurologically:  Mental status: The patient is awake, alert and oriented in all 4 spheres. Her immediate and remote memory, attention, language skills and fund of knowledge are appropriate. There is no evidence of aphasia, agnosia, apraxia or anomia. Speech is clear with normal prosody and enunciation. Thought process is linear. Mood is normal and affect is normal.  Cranial nerves II - XII are as described above under HEENT exam.  Motor exam: Normal bulk, strength and tone is noted. There is no tremor, Romberg is negative. Fine motor skills and coordination: grossly intact.  Cerebellar testing: No dysmetria or intention tremor. There is no truncal or gait ataxia.  Sensory exam: intact to light touch in the upper and lower extremities.  Gait, station and balance: She stands easily. No veering to one side is noted. No leaning to one side is noted. Posture is age-appropriate and stance is narrow based. Gait shows normal stride length and normal pace. No problems turning are noted. Tandem walk is unremarkable.                Assessment and Plan:   In summary, Hannah Cain is a very pleasant 50 y.o.-year old female with an underlying medical history of neck pain, vitamin D deficiency, B12 deficiency, hypertension, and obesity, who presents for evaluation of her tiredness.  While she does not have a telltale history of sleep apnea, underlying obstructive sleep disordered breathing is within the possible causes.  She  has had some improvement in her energy level and tiredness after being on monthly B12 injections which is reassuring.  I talked to the patient about sleep testing for sleep apnea, we talked about OSA, its prognosis and treatment options. I explained in particular the risks and ramifications of untreated moderate to severe OSA, especially with respect to developing cardiovascular disease down the Road,  including congestive heart failure, difficult to treat hypertension, cardiac arrhythmias, or stroke. Even type 2 diabetes has, in part, been linked to untreated OSA. Symptoms of untreated OSA include daytime sleepiness, memory problems, mood irritability and mood disorder such as depression and anxiety, lack of energy, as well as recurrent headaches, especially morning headaches. We talked about trying to maintain a healthy lifestyle in general, as well as the importance of weight control. We also talked about the importance of good sleep hygiene. I recommended the following at this time: sleep study.   I explained the sleep test procedure to the patient and also outlined possible surgical and non-surgical treatment options of OSA, including the use of a custom-made dental device (which would require a referral to a specialist dentist or oral surgeon), upper airway surgical options, such as traditional UPPP or a novel less invasive surgical option in the form of Inspire hypoglossal nerve stimulation (which would involve a referral to an ENT surgeon). I also explained the CPAP treatment option to the patient.  We will pick up our discussion after testing.  She is agreeable to proceed with a sleep study evaluation.  We will call her to schedule this once we have the insurance authorization and also keep her posted as to the results by phone call first.  I answered all her questions today and she was in agreement with the plan.  Thank you very much for allowing me to participate in the care of this nice patient. If I  can be of any further assistance to you please do not hesitate to call me at [phone].  Sincerely,   Star Age, MD, PhD

## 2020-10-19 NOTE — Patient Instructions (Signed)

## 2020-11-16 ENCOUNTER — Ambulatory Visit (INDEPENDENT_AMBULATORY_CARE_PROVIDER_SITE_OTHER): Payer: BC Managed Care – PPO | Admitting: Neurology

## 2020-11-16 ENCOUNTER — Other Ambulatory Visit: Payer: Self-pay

## 2020-11-16 DIAGNOSIS — E538 Deficiency of other specified B group vitamins: Secondary | ICD-10-CM

## 2020-11-16 DIAGNOSIS — R5383 Other fatigue: Secondary | ICD-10-CM

## 2020-11-16 DIAGNOSIS — R635 Abnormal weight gain: Secondary | ICD-10-CM

## 2020-11-16 DIAGNOSIS — E669 Obesity, unspecified: Secondary | ICD-10-CM

## 2020-11-16 DIAGNOSIS — G478 Other sleep disorders: Secondary | ICD-10-CM

## 2020-11-16 DIAGNOSIS — G4720 Circadian rhythm sleep disorder, unspecified type: Secondary | ICD-10-CM

## 2020-11-16 DIAGNOSIS — G4710 Hypersomnia, unspecified: Secondary | ICD-10-CM

## 2020-11-16 DIAGNOSIS — R002 Palpitations: Secondary | ICD-10-CM

## 2020-11-30 NOTE — Procedures (Signed)
PATIENT'S NAME:  Hannah Cain, Hannah Cain DOB:      1971/02/06      MR#:    [id number]     DATE OF RECORDING: 11/16/2020 REFERRING M.D.:  Cristie Hem, MD Study Performed:   Baseline Polysomnogram HISTORY: 50 year old woman with a history of neck pain, vitamin D deficiency, B12 deficiency, hypertension, and obesity, who reports daytime somnolence. The patient endorsed the Epworth Sleepiness Scale at 1 points. The patient's weight 211 pounds with a height of 61 (inches), resulting in a BMI of 40. kg/m2. The patient's neck circumference measured 14 inches.  CURRENT MEDICATIONS: Vitamin B-12, Enzyme Digest, Drisdol, Flaxseed, Flonase, Olopatadine HCI   PROCEDURE:  This is a multichannel digital polysomnogram utilizing the Somnostar 11.2 system.  Electrodes and sensors were applied and monitored per AASM Specifications.   EEG, EOG, Chin and Limb EMG, were sampled at 200 Hz.  ECG, Snore and Nasal Pressure, Thermal Airflow, Respiratory Effort, CPAP Flow and Pressure, Oximetry was sampled at 50 Hz. Digital video and audio were recorded.      BASELINE STUDY  Lights Out was at 22:21 and Lights On at 05:22.  The patient took Advil PM. She was provided with ear plugs.  Total recording time (TRT) was 421 minutes, with a total sleep time (TST) of 219.5 minutes.   The patient's sleep latency to persistent sleep was 197 minutes, which is markedly delayed.  REM sleep was absent. The sleep efficiency was 52.1%, which is significantly reduced.     SLEEP ARCHITECTURE: WASO (Wake after sleep onset) was 152 minutes, with moderate sleep fragmentation noted..  There were 34.5 minutes in Stage N1, 143 minutes Stage N2, 42 minutes Stage N3 and 0 minutes in Stage REM.  The percentage of Stage N1 was 15.7%, Stage N2 was 65.1%, both increased, stage N3 was 19.1% and Stage R (REM sleep) was absent. The arousals were noted as: 11 were spontaneous, 0 were associated with PLMs, 0 were associated with respiratory events.  RESPIRATORY ANALYSIS:   There were a total of 0 respiratory events:  0 obstructive apneas, 0 central apneas and 0 mixed apneas with a total of 0 apneas and an apnea index (AI) of 0 /hour. There were 0 hypopneas with a hypopnea index of 0 /hour. The patient also had 0 respiratory event related arousals (RERAs).      The total APNEA/HYPOPNEA INDEX (AHI) was 0/hour and the total RESPIRATORY DISTURBANCE INDEX was  0 /hour.  0 events occurred in REM sleep and 0 events in NREM. The REM AHI was  0 /hour, versus a non-REM AHI of 0. The patient spent 107.5 minutes of total sleep time in the supine position and 112 minutes in non-supine.. The supine AHI was 0.0 versus a non-supine AHI of 0.0.  OXYGEN SATURATION & C02:  The Wake baseline 02 saturation was 98%, with the lowest being 91%. Time spent below 89% saturation equaled 0 minutes.   PERIODIC LIMB MOVEMENTS: The patient had a total of 0 Periodic Limb Movements.  The Periodic Limb Movement (PLM) index was 0 and the PLM Arousal index was 0/hour.  Audio and video analysis did not show any abnormal or unusual movements, behaviors, phonations or vocalizations. The patient took 1 bathroom break.  No significant snoring was noted. The EKG was in keeping with normal sinus rhythm (NSR).  Post-study, the patient indicated that sleep was worse than usual.   IMPRESSION:  Dysfunctions associated with sleep stages or arousal from sleep  RECOMMENDATIONS:  This study does not demonstrate  any significant obstructive or central sleep disordered breathing. This study does not support an intrinsic sleep disorder as a cause of the patient's symptoms. Other causes, including circadian rhythm disturbances, an underlying mood disorder, medication effect and/or an underlying medical problem cannot be ruled out. 2. This study shows sleep fragmentation and abnormal sleep stage percentages; these are nonspecific findings and per se do not signify an intrinsic sleep disorder or a cause for the patient's  sleep-related symptoms. Causes include (but are not limited to) the first night effect of the sleep study, circadian rhythm disturbances, medication effect or an underlying mood disorder or medical problem.  3. The patient should be cautioned not to drive, work at heights, or operate dangerous or heavy equipment when tired or sleepy. Review and reiteration of good sleep hygiene measures should be pursued with any patient. 4. The patient will be advised to follow up with the referring provider, who will be notified of the test results.  I certify that I have reviewed the entire raw data recording prior to the issuance of this report in accordance with the Standards of Accreditation of the American Academy of Sleep Medicine (AASM)  Star Age, MD, PhD Diplomat, American Board of Neurology and Sleep Medicine (Neurology and Sleep Medicine)

## 2020-12-04 ENCOUNTER — Telehealth: Payer: Self-pay | Admitting: *Deleted

## 2020-12-04 NOTE — Telephone Encounter (Signed)
-----   Message from Star Age, MD sent at 11/30/2020  7:07 AM EDT ----- Patient referred by Dr. Jacalyn Lefevre, seen by me on 10/19/20, diagnostic PSG on 11/16/20.   Please call and notify the patient that the recent sleep study did not show any significant obstructive sleep apnea, or snoring or involuntary leg movements.  Unfortunately, she did not sleep very well and did not achieve any dream sleep.  She did not have any notable sleep apnea type events.  She can FU with her PCP at this point. She had indicated that her energy level and her daytime tiredness had improved with vit B12 supplementation.   Star Age, MD, PhD [facility])

## 2020-12-04 NOTE — Telephone Encounter (Signed)
Spoke with patient and discussed sleep study results per Dr Rexene Alberts.  Patient aware and advised there were no involuntary leg movements, snoring, or any notable sleep apnea type events.  Patient aware she can follow-up with her primary care at this point.  She did confirm the energy level and daytime tiredness had improved with B12 supplementation.  She was encouraged to call us if she has any future questions.  She verbalized appreciation for the call.

## 2021-03-08 ENCOUNTER — Other Ambulatory Visit: Payer: Self-pay

## 2021-03-08 ENCOUNTER — Ambulatory Visit (INDEPENDENT_AMBULATORY_CARE_PROVIDER_SITE_OTHER): Payer: BC Managed Care – PPO | Admitting: Internal Medicine

## 2021-03-08 ENCOUNTER — Encounter: Payer: Self-pay | Admitting: Internal Medicine

## 2021-03-08 VITALS — BP 136/68 | HR 71 | Temp 98.3°F | Ht 61.0 in | Wt 210.0 lb

## 2021-03-08 DIAGNOSIS — E538 Deficiency of other specified B group vitamins: Secondary | ICD-10-CM

## 2021-03-08 DIAGNOSIS — I10 Essential (primary) hypertension: Secondary | ICD-10-CM

## 2021-03-08 DIAGNOSIS — R059 Cough, unspecified: Secondary | ICD-10-CM

## 2021-03-08 DIAGNOSIS — F411 Generalized anxiety disorder: Secondary | ICD-10-CM

## 2021-03-08 DIAGNOSIS — J309 Allergic rhinitis, unspecified: Secondary | ICD-10-CM

## 2021-03-08 LAB — POC COVID19 BINAXNOW: SARS Coronavirus 2 Ag: NEGATIVE

## 2021-03-08 LAB — POCT INFLUENZA A/B
Influenza A, POC: NEGATIVE
Influenza B, POC: NEGATIVE

## 2021-03-08 LAB — POCT RAPID STREP A (OFFICE): Rapid Strep A Screen: NEGATIVE

## 2021-03-08 MED ORDER — DOXYCYCLINE HYCLATE 100 MG PO TABS: 100.0000 mg | ORAL_TABLET | Freq: Two times a day (BID) | ORAL | 0 refills | Status: DC

## 2021-03-08 MED ORDER — HYDROCODONE BIT-HOMATROP MBR 5-1.5 MG/5ML PO SOLN: 5.0000 mL | Freq: Four times a day (QID) | ORAL | 0 refills | Status: AC | PRN

## 2021-03-08 MED ORDER — CYANOCOBALAMIN 1000 MCG/ML IJ SOLN
1000.0000 ug | Freq: Once | INTRAMUSCULAR | Status: AC
  Administered 2021-03-08: 1000 ug via INTRAMUSCULAR

## 2021-03-08 NOTE — Patient Instructions (Signed)
Please take all new medication as prescribed - the antibiotic, and cough medicine as needed  Please continue all other medications as before, and refills have been done if requested.  Please have the pharmacy call with any other refills you may need.  Please continue your efforts at being more active, low cholesterol diet, and weight control.  Please keep your appointments with your specialists as you may have planned   

## 2021-03-08 NOTE — Progress Notes (Signed)
Patient ID: Hannah Cain, female   DOB: 03-19-1971, 50 y.o.   MRN: [id number]        Chief Complaint: follow up HTN, allergies and URI symptoms       HPI:  Hannah Cain is a 50 y.o. female  Here with 2-3 days acute onset fever, facial pain, pressure, headache, general weakness and malaise, and greenish d/c, with mild ST and cough that keeps her up all night with worsening fatigue,, but pt denies chest pain, wheezing, increased sob or doe, orthopnea, PND, increased LE swelling, palpitations, dizziness or syncope.   OTC preps no help.  Works at preschool.  Has not checked covid or other exam prior to today.  Does have several wks ongoing nasal allergy symptoms with clearish congestion, itch and sneezing, without fever, pain, ST, cough, swelling or wheezing.  Denies worsening depressive symptoms, suicidal ideation, or pani       Wt Readings from Last 3 Encounters:  03/08/21 210 lb (95.3 kg)  10/19/20 211 lb 4 oz (95.8 kg)  06/30/19 194 lb (88 kg)   BP Readings from Last 3 Encounters:  03/08/21 136/68  10/19/20 134/83  06/30/19 118/74         Past Medical History:  Diagnosis Date   ALLERGIC RHINITIS 03/17/2007   ANXIETY 03/17/2007   DENIES 05/31/16   Arthritis    Basal cell carcinoma (BCC) 06/10/2017   HLD (hyperlipidemia) 06/10/2017   Palpitations 11/12/2017   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   URI 07/19/2009   Past Surgical History:  Procedure Laterality Date   BREAST BIOPSY Right [place]      reports that she has never smoked. She has never used smokeless tobacco. She reports that she does not drink alcohol and does not use drugs. family history includes Breast cancer in her maternal grandmother; COPD in her maternal grandmother; Colon cancer (age of onset: 82) in her brother; Colon polyps in her maternal grandmother; Heart failure in her maternal grandmother; Hyperlipidemia in her maternal grandmother; Hypertension in her maternal grandmother and mother; Hypothyroidism in her mother;  Liver cancer in her brother; Stroke in her maternal grandmother; Vitiligo in her mother. No Known Allergies Current Outpatient Medications on File Prior to Visit  Medication Sig Dispense Refill   Cyanocobalamin (VITAMIN B-12 IJ) Inject as directed.     Digestive Enzymes (ENZYME DIGEST PO) Take by mouth.     FLAXSEED, LINSEED, PO Take by mouth.     fluticasone (FLONASE) 50 MCG/ACT nasal spray Place 2 sprays into both nostrils daily.     Olopatadine HCl 0.6 % SOLN olopatadine 0.6 % nasal spray  SPRAY 2 SPRAYS INTO EACH NOSTRIL TWICE A DAY     Vitamin D, Ergocalciferol, (DRISDOL) 1.25 MG (50000 UNIT) CAPS capsule Take 1 capsule (50,000 Units total) by mouth every 7 (seven) days. 12 capsule 0   No current facility-administered medications on file prior to visit.        ROS:  All others reviewed and negative.  Objective        PE:  BP 136/68 (BP Location: Left Arm, Patient Position: Sitting, Cuff Size: Large)   Pulse 71   Temp 98.3 F (36.8 C) (Oral)   Ht 5\' 1"  (1.549 m)   Wt 210 lb (95.3 kg)   SpO2 99%   BMI 39.68 kg/m                 Constitutional: Pt appears in NAD  HENT: Head: NCAT.                Right Ear: External ear normal.                 Left Ear: External ear normal Bilat tm's with mild erythema.  Max sinus areas mild tender.  Pharynx with mild erythema, no exudate                Eyes: . Pupils are equal, round, and reactive to light. Conjunctivae and EOM are normal               Nose: without d/c or deformity               Neck: Neck supple. Gross normal ROM               Cardiovascular: Normal rate and regular rhythm.                 Pulmonary/Chest: Effort normal and breath sounds without rales or wheezing.                Abd:  Soft, NT, ND, + BS, no organomegaly               Neurological: Pt is alert. At baseline orientation, motor grossly intact               Skin: Skin is warm. No rashes, no other new lesions, LE edema - none                Psychiatric: Pt behavior is normal without agitation , mild nervous  Micro: none  Cardiac tracings I have personally interpreted today:  none  Pertinent Radiological findings (summarize): none   Lab Results  Component Value Date   WBC 5.5 06/25/2019   HGB 13.2 06/25/2019   HCT 39.0 06/25/2019   PLT 227.0 06/25/2019   GLUCOSE 88 06/25/2019   CHOL 167 06/25/2019   TRIG 54.0 06/25/2019   HDL 59.40 06/25/2019   LDLCALC 97 06/25/2019   ALT 12 06/25/2019   AST 15 06/25/2019   NA 135 06/25/2019   K 3.8 06/25/2019   CL 101 06/25/2019   CREATININE 0.80 06/25/2019   BUN 13 06/25/2019   CO2 26 06/25/2019   TSH 3.47 06/25/2019   Rapid Strep A Screen Negative Negative    Influenza A, POC Negative Negative   Influenza B, POC Negative Negative   SARS Coronavirus 2 Ag Negative Negative    Assessment/Plan:  Hannah Cain is a 50 y.o. White or Caucasian [1] female with  has a past medical history of ALLERGIC RHINITIS (03/17/2007), ANXIETY (03/17/2007), Arthritis, Basal cell carcinoma (BCC) (06/10/2017), HLD (hyperlipidemia) (06/10/2017), Palpitations (11/12/2017), Symptomatic PVCs (06/11/2018), and URI (07/19/2009).  Anxiety state Mild situational worsening, reassured, declines need for med change at this time  Allergic rhinitis Mild to mod, for flonase restart,  to f/u any worsening symptoms or concerns  Hypertension BP Readings from Last 3 Encounters:  03/08/21 136/68  10/19/20 134/83  06/30/19 118/74   Stable, pt to continue medical treatment  - diet, wt control, low salt, excercise   Cough C/w uri/bronchitis vs pna , decliens cxr, for empiric doxy, cough med prn,  to f/u any worsening symptoms or concerns  B12 deficiency For IM replacement today, and f/u monthly  Followup: Return if symptoms worsen or fail to improve.  Cathlean Cower, MD 03/09/2021 4:27 AM [facility]

## 2021-03-09 ENCOUNTER — Encounter: Payer: Self-pay | Admitting: Internal Medicine

## 2021-03-09 NOTE — Assessment & Plan Note (Signed)
Mild situational worsening, reassured, declines need for med change at this time

## 2021-03-09 NOTE — Assessment & Plan Note (Signed)
Mild to mod, for flonase restart,  to f/u any worsening symptoms or concerns 

## 2021-03-09 NOTE — Assessment & Plan Note (Signed)
BP Readings from Last 3 Encounters:  03/08/21 136/68  10/19/20 134/83  06/30/19 118/74   Stable, pt to continue medical treatment  - diet, wt control, low salt, excercise

## 2021-03-09 NOTE — Assessment & Plan Note (Signed)
C/w uri/bronchitis vs pna , decliens cxr, for empiric doxy, cough med prn,  to f/u any worsening symptoms or concerns

## 2021-03-09 NOTE — Assessment & Plan Note (Signed)
For IM replacement today, and f/u monthly

## 2021-04-11 ENCOUNTER — Other Ambulatory Visit: Payer: Self-pay

## 2021-04-11 ENCOUNTER — Ambulatory Visit (INDEPENDENT_AMBULATORY_CARE_PROVIDER_SITE_OTHER): Payer: BC Managed Care – PPO

## 2021-04-11 DIAGNOSIS — E538 Deficiency of other specified B group vitamins: Secondary | ICD-10-CM

## 2021-04-11 MED ORDER — CYANOCOBALAMIN 1000 MCG/ML IJ SOLN
1000.0000 ug | Freq: Once | INTRAMUSCULAR | Status: AC
  Administered 2021-04-11: 1000 ug via INTRAMUSCULAR

## 2021-04-11 NOTE — Progress Notes (Signed)
Pt was given B12 w/o any complications. 

## 2021-04-24 ENCOUNTER — Encounter: Payer: Self-pay | Admitting: Gastroenterology

## 2021-04-24 ENCOUNTER — Other Ambulatory Visit: Payer: BC Managed Care – PPO

## 2021-04-24 ENCOUNTER — Ambulatory Visit: Payer: BC Managed Care – PPO | Admitting: Gastroenterology

## 2021-04-24 VITALS — BP 120/70 | HR 89 | Ht 67.0 in | Wt 221.1 lb

## 2021-04-24 DIAGNOSIS — Z800 Family history of malignant neoplasm of digestive organs: Secondary | ICD-10-CM | POA: Diagnosis not present

## 2021-04-24 DIAGNOSIS — E538 Deficiency of other specified B group vitamins: Secondary | ICD-10-CM | POA: Diagnosis not present

## 2021-04-24 MED ORDER — FAMOTIDINE 20 MG PO TABS: 20.0000 mg | ORAL_TABLET | Freq: Every day | ORAL | 11 refills | Status: DC

## 2021-04-24 NOTE — Patient Instructions (Addendum)
If you are age 50 or younger, your body mass index should be between 19-25. Your Body mass index is 34.63 kg/m. If this is out of the aformentioned range listed, please consider follow up with your Primary Care Provider.  ________________________________________________________  The [facility] providers would like to encourage you to use [facility] to communicate with providers for non-urgent requests or questions.  Due to long hold times on the telephone, sending your provider a message by [facility] may be a faster and more efficient way to get a response.  Please allow 48 business hours for a response.  Please remember that this is for non-urgent requests.  _______________________________________________________  Your provider has requested that you go to the basement level for lab work before leaving today. Press "B" on the elevator. The lab is located at the first door on the left as you exit the elevator.  You have been scheduled for a colonoscopy. Please follow written instructions given to you at your visit today.  Please pick up your prep supplies at the pharmacy within the next 1-3 days. If you use inhalers (even only as needed), please bring them with you on the day of your procedure.  Due to recent changes in healthcare laws, you may see the results of your imaging and laboratory studies on MyChart before your provider has had a chance to review them.  We understand that in some cases there may be results that are confusing or concerning to you. Not all laboratory results come back in the same time frame and the provider may be waiting for multiple results in order to interpret others.  Please give [name] 48 hours in order for your provider to thoroughly review all the results before contacting the office for clarification of your results.   We have sent the following medications to your pharmacy for you to pick up at your convenience:  START: famotidine 20mg  one tablet every night at  bedtime.  Thank you for entrusting me with your care and choosing [facility].  Dr Ardis Hughs

## 2021-04-24 NOTE — Progress Notes (Signed)
Review of pertinent gastrointestinal problems: 1.  Family history colon cancer, brother was diagnosed with colon cancer in his 33s.  Genetic testing done and it was negative.  Colonoscopy February 2018 was completely normal.   HPI: This is a very pleasant 51 year old woman who was referred to me by Biagio Borg, MD  to evaluate family history of colon cancer, chronic GERD, low B12.    Her brother has what sounds like metastatic colon cancer and is doing quite poorly.  He has been told he is unlikely to survive the next several months.  She is having no issues with her bowels.  No GI bleeding, no changes in her bowels.  She does have mild very intermittent acid regurgitation up to her neck.  She says she has noticed a clear relation of caffeine intake.  This will cause her to clear her throat a lot.  She saw an ENT and he thought acid was playing a role in her issues.  He prescribed her omeprazole which she was on for a while and that certainly helped.  She never has dysphagia.  Her weight is overall stable.  She has not tried H2 blockers.  Her symptoms occur about once a month.  They tend to be overnight or sometimes in the morning.  She was told she has B12 deficiency and is on replacement therapy for it.  She is not anemic      Review of systems: Pertinent positive and negative review of systems were noted in the above HPI section. All other review negative.   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     Cyanocobalamin (VITAMIN B-12 IJ) Inject as directed.     FLAXSEED, LINSEED, PO Take by mouth.     fluticasone (FLONASE) 50 MCG/ACT nasal spray Place 2 sprays into both nostrils daily.     Multiple Vitamin (MULTIVITAMIN) tablet Take 1 tablet by mouth daily.     OMEGA-3 FATTY ACIDS-VITAMIN E PO Take by mouth.     Vitamin D, Ergocalciferol, (DRISDOL) 1.25 MG (50000 UNIT) CAPS capsule Take 1 capsule (50,000 Units total) by mouth every 7 (seven) days. 12 capsule 0   No current facility-administered medications for this visit.    Allergies as of 04/24/2021   (No Known Allergies)    Family History  Problem Relation Age of Onset   Hypertension Mother    Hypothyroidism Mother    Vitiligo Mother    Colon cancer Brother 70       mets to liver   Liver cancer Brother    Breast cancer Maternal Grandmother    Colon polyps Maternal Grandmother    Stroke Maternal Grandmother    Hypertension Maternal Grandmother    Hyperlipidemia Maternal Grandmother    COPD Maternal Grandmother    Heart failure Maternal Grandmother     Social History   Socioeconomic History   Marital status: Married    Spouse name: Not  on file   Number of children: 2   Years of education: 16   Highest education level: Not on file  Occupational History   Occupation: Armed forces training and education officer  Tobacco Use   Smoking status: Never   Smokeless tobacco: Never  Vaping Use   Vaping Use: Never used  Substance and Sexual Activity   Alcohol use: No   Drug use: No   Sexual activity: Not on file  Other Topics Concern   Not on file  Social History Narrative   Right handed   One story home   Social Determinants of Health   Financial Resource Strain: Not on file  Food Insecurity: Not on file  Transportation Needs: Not on file  Physical Activity: Not on file  Stress: Not on file  Social Connections: Not on file  Intimate Partner Violence: Not on file     Physical Exam: BP  120/70    Pulse 89    Ht 5\' 7"  (1.702 m)    Wt 221 lb 2 oz (100.3 kg)    BMI 34.63 kg/m  Constitutional: generally well-appearing Psychiatric: alert and oriented x3 Eyes: extraocular movements intact Mouth: oral pharynx moist, no lesions Neck: supple no lymphadenopathy Cardiovascular: heart regular rate and rhythm Lungs: clear to auscultation bilaterally Abdomen: soft, nontender, nondistended, no obvious ascites, no peritoneal signs, normal bowel sounds Extremities: no lower extremity edema bilaterally Skin: no lesions on visible extremities   Assessment and plan: 51 y.o. female with significant family history of colon cancer, chronic GERD without alarm symptoms, 12 deficiency  I recommended colonoscopy at her soonest convenience given her significant family history of colon cancer.  She has GERD without alarm symptoms.  These are pretty mild and occur only about once a month.  I recommended a trial of famotidine 20 mg at bedtime every night.  I also educated her about certain lifestyle modifications she can make including raising the head of the bed, avoiding caffeine, avoiding chocolate avoid peppermints which she does actually eat quite a bit of.  I do not think she needs upper endoscopy since she has no alarm symptoms.  I am going to get intrinsic factor antibody given her B12 deficiency, she has read about pernicious anemia  Please see the "Patient Instructions" section for addition details about the plan.   Owens Loffler, MD [facility] 04/24/2021, 3:19 PM  Cc: Biagio Borg, MD  Total time on date of encounter was 45  minutes (this included time spent preparing to see the patient reviewing records; obtaining and/or reviewing separately obtained history; performing a medically appropriate exam and/or evaluation; counseling and educating the patient and family if present; ordering medications, tests or procedures if applicable; and documenting clinical information in the  health record).

## 2021-04-27 LAB — INTRINSIC FACTOR ANTIBODIES: Intrinsic Factor: NEGATIVE

## 2021-05-16 ENCOUNTER — Other Ambulatory Visit: Payer: Self-pay

## 2021-05-16 ENCOUNTER — Ambulatory Visit (INDEPENDENT_AMBULATORY_CARE_PROVIDER_SITE_OTHER): Payer: BC Managed Care – PPO

## 2021-05-16 DIAGNOSIS — E538 Deficiency of other specified B group vitamins: Secondary | ICD-10-CM

## 2021-05-16 MED ORDER — CYANOCOBALAMIN 1000 MCG/ML IJ SOLN
1000.0000 ug | Freq: Once | INTRAMUSCULAR | Status: AC
  Administered 2021-05-16: 1000 ug via INTRAMUSCULAR

## 2021-05-16 NOTE — Progress Notes (Signed)
Patient here for B12 injection per Dr. Jenny Reichmann. B12 1000 mcg given IM in right arm and patient tolerated injection well today.

## 2021-05-25 ENCOUNTER — Encounter: Payer: Self-pay | Admitting: Gastroenterology

## 2021-06-01 ENCOUNTER — Other Ambulatory Visit: Payer: Self-pay

## 2021-06-01 ENCOUNTER — Ambulatory Visit (AMBULATORY_SURGERY_CENTER): Payer: BC Managed Care – PPO | Admitting: Gastroenterology

## 2021-06-01 ENCOUNTER — Encounter: Payer: Self-pay | Admitting: Gastroenterology

## 2021-06-01 VITALS — BP 145/91 | HR 89 | Temp 97.8°F | Resp 10 | Ht 67.0 in | Wt 221.0 lb

## 2021-06-01 DIAGNOSIS — Z1211 Encounter for screening for malignant neoplasm of colon: Secondary | ICD-10-CM

## 2021-06-01 DIAGNOSIS — Z800 Family history of malignant neoplasm of digestive organs: Secondary | ICD-10-CM | POA: Diagnosis not present

## 2021-06-01 MED ORDER — SODIUM CHLORIDE 0.9 % IV SOLN: 500.0000 mL | INTRAVENOUS | Status: DC

## 2021-06-01 NOTE — Patient Instructions (Signed)
Repeat colonoscopy in 5 years for screening purposes.   YOU HAD AN ENDOSCOPIC PROCEDURE TODAY AT [facility]:   Refer to the procedure report that was given to you for any specific questions about what was found during the examination.  If the procedure report does not answer your questions, please call your gastroenterologist to clarify.  If you requested that your care partner not be given the details of your procedure findings, then the procedure report has been included in a sealed envelope for you to review at your convenience later.  YOU SHOULD EXPECT: Some feelings of bloating in the abdomen. Passage of more gas than usual.  Walking can help get rid of the air that was put into your GI tract during the procedure and reduce the bloating. If you had a lower endoscopy (such as a colonoscopy or flexible sigmoidoscopy) you may notice spotting of blood in your stool or on the toilet paper. If you underwent a bowel prep for your procedure, you may not have a normal bowel movement for a few days.  Please Note:  You might notice some irritation and congestion in your nose or some drainage.  This is from the oxygen used during your procedure.  There is no need for concern and it should clear up in a day or so.  SYMPTOMS TO REPORT IMMEDIATELY:  Following lower endoscopy (colonoscopy or flexible sigmoidoscopy):  Excessive amounts of blood in the stool  Significant tenderness or worsening of abdominal pains  Swelling of the abdomen that is new, acute  Fever of 100F or higher  For urgent or emergent issues, a gastroenterologist can be reached at any hour by calling [phone]. Do not use MyChart messaging for urgent concerns.    DIET:  We do recommend a small meal at first, but then you may proceed to your regular diet.  Drink plenty of fluids but you should avoid alcoholic beverages for 24 hours.  ACTIVITY:  You should plan to take it easy for the rest of today and you should  NOT DRIVE or use heavy machinery until tomorrow (because of the sedation medicines used during the test).    FOLLOW UP: Our staff will call the number listed on your records 48-72 hours following your procedure to check on you and address any questions or concerns that you may have regarding the information given to you following your procedure. If we do not reach you, we will leave a message.  We will attempt to reach you two times.  During this call, we will ask if you have developed any symptoms of COVID 19. If you develop any symptoms (ie: fever, flu-like symptoms, shortness of breath, cough etc.) before then, please call [phone].  If you test positive for Covid 19 in the 2 weeks post procedure, please call and report this information to [name].    If any biopsies were taken you will be contacted by phone or by letter within the next 1-3 weeks.  Please call us at [phone] if you have not heard about the biopsies in 3 weeks.    SIGNATURES/CONFIDENTIALITY: You and/or your care partner have signed paperwork which will be entered into your electronic medical record.  These signatures attest to the fact that that the information above on your After Visit Summary has been reviewed and is understood.  Full responsibility of the confidentiality of this discharge information lies with you and/or your care-partner.

## 2021-06-01 NOTE — Progress Notes (Signed)
HPI: This is a woman with FH of CRC  Family history colon cancer, brother was diagnosed with colon cancer in his 66s.  Genetic testing done and it was negative.  Colonoscopy February 2018 was completely normal.   ROS: complete GI ROS as described in HPI, all other review negative.  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     Cyanocobalamin (VITAMIN B-12 IJ) Inject as directed.     famotidine (PEPCID) 20 MG tablet Take 1 tablet (20 mg total) by mouth at bedtime. 30 tablet 11   FLAXSEED, LINSEED, PO Take by mouth.     fluticasone (FLONASE) 50 MCG/ACT nasal spray Place 2 sprays into both nostrils daily.     Multiple Vitamin (MULTIVITAMIN) tablet Take 1 tablet by mouth daily.     OMEGA-3 FATTY ACIDS-VITAMIN E PO Take by mouth.     Vitamin D, Ergocalciferol, (DRISDOL) 1.25 MG (50000 UNIT) CAPS capsule Take 1 capsule (50,000 Units total) by mouth every 7 (seven) days. 12 capsule 0   Current Facility-Administered Medications  Medication Dose Route Frequency Provider Last Rate Last Admin   0.9 %  sodium chloride infusion  500 mL Intravenous Continuous Milus Banister, MD        Allergies as of 06/01/2021   (No Known Allergies)    Family History  Problem Relation Age of Onset   Hypertension Mother    Hypothyroidism Mother    Vitiligo Mother    Colon cancer Brother 58       mets to liver   Liver cancer Brother    Breast  cancer Maternal Grandmother    Colon polyps Maternal Grandmother    Stroke Maternal Grandmother    Hypertension Maternal Grandmother    Hyperlipidemia Maternal Grandmother    COPD Maternal Grandmother    Heart failure Maternal Grandmother     Social History   Socioeconomic History   Marital status: Married    Spouse name: Not on file   Number of children: 2   Years of education: 16   Highest education level: Not on file  Occupational History   Occupation: Armed forces training and education officer  Tobacco Use   Smoking status: Never   Smokeless tobacco: Never  Vaping Use   Vaping Use: Never used  Substance and Sexual Activity   Alcohol use: No   Drug use: No   Sexual activity: Not on file  Other Topics Concern   Not on file  Social History Narrative   Right handed   One story home   Social Determinants of Health   Financial Resource Strain: Not on file  Food Insecurity: Not on file  Transportation Needs: Not on file  Physical Activity: Not on file  Stress: Not on file  Social Connections: Not on file  Intimate Partner Violence: Not on file     Physical Exam: BP 123/84    Pulse (!) 108  Temp 97.8 F (36.6 C)    Ht 5\' 7"  (1.702 m)    Wt 221 lb (100.2 kg)    SpO2 100%    BMI 34.61 kg/m  Constitutional: generally well-appearing Psychiatric: alert and oriented x3 Lungs: CTA bilaterally Heart: no MCR  Assessment and plan: 51 y.o. female with fh of CRC  Colonoscopy today  Care is appropriate for the ambulatory setting.  Owens Loffler, MD [facility] 06/01/2021, 1:40 PM

## 2021-06-01 NOTE — Progress Notes (Signed)
VS per DT

## 2021-06-01 NOTE — Op Note (Signed)
[place] Patient Name: Hannah Cain Procedure Date: 06/01/2021 2:02 PM MRN: [id number] Endoscopist: Milus Banister , MD Age: 51 Referring MD:  Date of Birth: 01-Jul-1970 Gender: Female Account #: [id number] Procedure:                Colonoscopy Indications:              Screening in patient at increased risk: her brother                            was diagnosed with colon cancer in his 29s and                            eventually passed from it. Genetic testing done and                            it was negative. Colonoscopy February 2018 was                            completely normal. Medicines:                Monitored Anesthesia Care Procedure:                Pre-Anesthesia Assessment:                           - Prior to the procedure, a History and Physical                            was performed, and patient medications and                            allergies were reviewed. The patient's tolerance of                            previous anesthesia was also reviewed. The risks                            and benefits of the procedure and the sedation                            options and risks were discussed with the patient.                            All questions were answered, and informed consent                            was obtained. Prior Anticoagulants: The patient has                            taken no previous anticoagulant or antiplatelet                            agents. ASA Grade Assessment: II - A patient with  mild systemic disease. After reviewing the risks                            and benefits, the patient was deemed in                            satisfactory condition to undergo the procedure.                           After obtaining informed consent, the colonoscope                            was passed under direct vision. Throughout the                            procedure, the patient's blood pressure, pulse,  and                            oxygen saturations were monitored continuously. The                            Olympus CF-HQ190L ([id number]) Colonoscope was                            introduced through the anus and advanced to the the                            cecum, identified by appendiceal orifice and                            ileocecal valve. The colonoscopy was performed                            without difficulty. The patient tolerated the                            procedure well. The quality of the bowel                            preparation was good. The ileocecal valve,                            appendiceal orifice, and rectum were photographed. Scope In: 2:15:38 PM Scope Out: 2:26:50 PM Scope Withdrawal Time: 0 hours 8 minutes 39 seconds  Total Procedure Duration: 0 hours 11 minutes 12 seconds  Findings:                 The entire examined colon appeared normal on direct                            and retroflexion views. Complications:            No immediate complications. Estimated blood loss:  None. Estimated Blood Loss:     Estimated blood loss: none. Impression:               - The entire examined colon is normal on direct and                            retroflexion views.                           - No polyps or cancers. Recommendation:           - Patient has a contact number available for                            emergencies. The signs and symptoms of potential                            delayed complications were discussed with the                            patient. Return to normal activities tomorrow.                            Written discharge instructions were provided to the                            patient.                           - Resume previous diet.                           - Continue present medications.                           - Repeat colonoscopy in 5 years for screening. Milus Banister, MD 06/01/2021 2:29:52  PM This report has been signed electronically.

## 2021-06-01 NOTE — Progress Notes (Signed)
Report given to PACU, vss 

## 2021-06-05 ENCOUNTER — Telehealth: Payer: Self-pay | Admitting: *Deleted

## 2021-06-05 NOTE — Telephone Encounter (Signed)
°  Follow up Call-  Call back number 06/01/2021  Post procedure Call Back phone  # [phone]  Permission to leave phone message Yes  Some recent data might be hidden     Patient questions:  Do you have a fever, pain , or abdominal swelling? No. Pain Score  0 *  Have you tolerated food without any problems? Yes.    Have you been able to return to your normal activities? Yes.    Do you have any questions about your discharge instructions: Diet   No. Medications  No. Follow up visit  No.  Do you have questions or concerns about your Care? No.  Actions: * If pain score is 4 or above: No action needed, pain <4.

## 2021-06-14 ENCOUNTER — Ambulatory Visit (INDEPENDENT_AMBULATORY_CARE_PROVIDER_SITE_OTHER): Payer: BC Managed Care – PPO

## 2021-06-14 ENCOUNTER — Other Ambulatory Visit: Payer: Self-pay

## 2021-06-14 DIAGNOSIS — E538 Deficiency of other specified B group vitamins: Secondary | ICD-10-CM

## 2021-06-14 MED ORDER — CYANOCOBALAMIN 1000 MCG/ML IJ SOLN
1000.0000 ug | Freq: Once | INTRAMUSCULAR | Status: AC
  Administered 2021-06-14: 16:00:00 1000 ug via INTRAMUSCULAR

## 2021-06-14 NOTE — Progress Notes (Signed)
Pt came into the office to receive her b-12 injection. She tolerated the injection well. No questions or concerns.  ?

## 2021-06-28 ENCOUNTER — Encounter: Payer: BC Managed Care – PPO | Admitting: Internal Medicine

## 2021-07-10 ENCOUNTER — Ambulatory Visit (INDEPENDENT_AMBULATORY_CARE_PROVIDER_SITE_OTHER): Payer: BC Managed Care – PPO | Admitting: Internal Medicine

## 2021-07-10 ENCOUNTER — Encounter: Payer: Self-pay | Admitting: Internal Medicine

## 2021-07-10 VITALS — BP 120/68 | HR 82 | Temp 98.7°F | Ht 67.0 in | Wt 218.0 lb

## 2021-07-10 DIAGNOSIS — Z1159 Encounter for screening for other viral diseases: Secondary | ICD-10-CM

## 2021-07-10 DIAGNOSIS — E559 Vitamin D deficiency, unspecified: Secondary | ICD-10-CM | POA: Diagnosis not present

## 2021-07-10 DIAGNOSIS — Z0001 Encounter for general adult medical examination with abnormal findings: Secondary | ICD-10-CM

## 2021-07-10 DIAGNOSIS — R739 Hyperglycemia, unspecified: Secondary | ICD-10-CM | POA: Diagnosis not present

## 2021-07-10 DIAGNOSIS — Z136 Encounter for screening for cardiovascular disorders: Secondary | ICD-10-CM

## 2021-07-10 DIAGNOSIS — M25512 Pain in left shoulder: Secondary | ICD-10-CM | POA: Diagnosis not present

## 2021-07-10 DIAGNOSIS — J069 Acute upper respiratory infection, unspecified: Secondary | ICD-10-CM

## 2021-07-10 DIAGNOSIS — G8929 Other chronic pain: Secondary | ICD-10-CM

## 2021-07-10 DIAGNOSIS — E538 Deficiency of other specified B group vitamins: Secondary | ICD-10-CM

## 2021-07-10 LAB — CBC WITH DIFFERENTIAL/PLATELET
Basophils Absolute: 0.1 10*3/uL (ref 0.0–0.1)
Basophils Relative: 0.7 % (ref 0.0–3.0)
Eosinophils Absolute: 0.5 10*3/uL (ref 0.0–0.7)
Eosinophils Relative: 5.3 % — ABNORMAL HIGH (ref 0.0–5.0)
HCT: 40 % (ref 36.0–46.0)
Hemoglobin: 13.4 g/dL (ref 12.0–15.0)
Lymphocytes Relative: 21 % (ref 12.0–46.0)
Lymphs Abs: 1.9 10*3/uL (ref 0.7–4.0)
MCHC: 33.5 g/dL (ref 30.0–36.0)
MCV: 87.9 fl (ref 78.0–100.0)
Monocytes Absolute: 1.6 10*3/uL — ABNORMAL HIGH (ref 0.1–1.0)
Monocytes Relative: 17.2 % — ABNORMAL HIGH (ref 3.0–12.0)
Neutro Abs: 5.1 10*3/uL (ref 1.4–7.7)
Neutrophils Relative %: 55.8 % (ref 43.0–77.0)
Platelets: 217 10*3/uL (ref 150.0–400.0)
RBC: 4.55 Mil/uL (ref 3.87–5.11)
RDW: 13.8 % (ref 11.5–15.5)
WBC: 9.1 10*3/uL (ref 4.0–10.5)

## 2021-07-10 LAB — LIPID PANEL
Cholesterol: 190 mg/dL (ref 0–200)
HDL: 69.3 mg/dL (ref >39.00)
LDL Cholesterol: 106 mg/dL — ABNORMAL HIGH (ref 0–99)
NonHDL: 120.48
Total CHOL/HDL Ratio: 3
Triglycerides: 74 mg/dL (ref 0.0–149.0)
VLDL: 14.8 mg/dL (ref 0.0–40.0)

## 2021-07-10 LAB — BASIC METABOLIC PANEL
BUN: 10 mg/dL (ref 6–23)
CO2: 27 mEq/L (ref 19–32)
Calcium: 9 mg/dL (ref 8.4–10.5)
Chloride: 101 mEq/L (ref 96–112)
Creatinine, Ser: 0.79 mg/dL (ref 0.40–1.20)
GFR: 87.43 mL/min (ref >60.00)
Glucose, Bld: 91 mg/dL (ref 70–99)
Potassium: 4.2 mEq/L (ref 3.5–5.1)
Sodium: 137 mEq/L (ref 135–145)

## 2021-07-10 LAB — HEPATIC FUNCTION PANEL
ALT: 12 U/L (ref 0–35)
AST: 17 U/L (ref 0–37)
Albumin: 4.4 g/dL (ref 3.5–5.2)
Alkaline Phosphatase: 68 U/L (ref 39–117)
Bilirubin, Direct: 0.1 mg/dL (ref 0.0–0.3)
Total Bilirubin: 0.5 mg/dL (ref 0.2–1.2)
Total Protein: 7.7 g/dL (ref 6.0–8.3)

## 2021-07-10 LAB — VITAMIN D 25 HYDROXY (VIT D DEFICIENCY, FRACTURES): VITD: 36.85 ng/mL (ref 30.00–100.00)

## 2021-07-10 LAB — URINALYSIS, ROUTINE W REFLEX MICROSCOPIC
Bilirubin Urine: NEGATIVE
Ketones, ur: NEGATIVE
Leukocytes,Ua: NEGATIVE
Nitrite: NEGATIVE
Specific Gravity, Urine: <=1.005 — AB (ref 1.000–1.030)
Total Protein, Urine: NEGATIVE
Urine Glucose: NEGATIVE
Urobilinogen, UA: 0.2 (ref 0.0–1.0)
pH: 6 (ref 5.0–8.0)

## 2021-07-10 LAB — VITAMIN B12: Vitamin B-12: 695 pg/mL (ref 211–911)

## 2021-07-10 LAB — TSH: TSH: 2.85 u[IU]/mL (ref 0.35–5.50)

## 2021-07-10 MED ORDER — AZITHROMYCIN 250 MG PO TABS: ORAL_TABLET | ORAL | 1 refills | Status: AC

## 2021-07-10 NOTE — Assessment & Plan Note (Signed)
Last vitamin D ?Lab Results  ?Component Value Date  ? VD25OH 30.55 06/25/2019  ? ?Low, to start oral replacement ? ?

## 2021-07-10 NOTE — Progress Notes (Signed)
Patient ID: Hannah Cain, female   DOB: [birth year], 51 y.o.   MRN: [id number] ? ? ? ?     Chief Complaint:: wellness exam and sore throat/uri, low b12, left shoulder pain, grief, left pelvic pain ? ?     HPI:  Hannah Cain is a 51 y.o. female here for wellness exam; due for hep c screen, declines shingrix, plans to call for pap soon, o/w up to date ?         ?              Also borther died at 51 yo earlier this yr with colon cancer.  Now on B12 shots per neurology.   Here with 2-3 days acute onset fever,severe ST pain, pressure, headache, general weakness and malaise, but pt denies chest pain, wheezing, increased sob or doe, orthopnea, PND, increased LE swelling, palpitations, dizziness or syncope.   Pt denies polydipsia, polyuria, or new focal neuro s/s.    Pt denies fever, wt loss, night sweats, loss of appetite, or other constitutional symptoms  Also has 1 yr onset left shoudler pain, mild to mod, worse to abduct, better to rest.  Also has ongoing several months left pelvic pain with plan to f/u GYN and possible further surgical tx.    ?  ?Wt Readings from Last 3 Encounters:  ?07/10/21 218 lb (98.9 kg)  ?06/01/21 221 lb (100.2 kg)  ?04/24/21 221 lb 2 oz (100.3 kg)  ? ?BP Readings from Last 3 Encounters:  ?07/10/21 120/68  ?06/01/21 (!) 145/91  ?04/24/21 120/70  ? ?Immunization History  ?Administered Date(s) Administered  ? Influenza Split 03/11/2011, 12/30/2019  ? Influenza,inj,Quad PF,6+ Mos 12/31/2018  ? Influenza-Unspecified 01/05/2018, 01/19/2021  ? Moderna Sars-Covid-2 Vaccination 06/19/2019, 07/17/2019  ? Tdap 07/02/2018  ? ?Health Maintenance Due  ?Topic Date Due  ? Hepatitis C Screening  Never done  ? ?  ? ?Past Medical History:  ?Diagnosis Date  ? ALLERGIC RHINITIS 03/17/2007  ? Arthritis   ? Basal cell carcinoma (BCC) 06/10/2017  ? GERD (gastroesophageal reflux disease)   ? HLD (hyperlipidemia) 06/10/2017  ? Palpitations 11/12/2017  ? 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 URI 07/19/2009  ? Vitamin B 12  deficiency   ? ?Past Surgical History:  ?Procedure Laterality Date  ? BREAST BIOPSY Right 1990  ? MOUTH SURGERY    ? ? reports that she has never smoked. She has never used smokeless tobacco. She reports that she does not drink alcohol and does not use drugs. ?family history includes Breast cancer in her maternal grandmother; COPD in her maternal grandmother; Colon cancer (age of onset: 60) in her brother; Colon polyps in her maternal grandmother; Heart failure in her maternal grandmother; Hyperlipidemia in her maternal grandmother; Hypertension in her maternal grandmother and mother; Hypothyroidism in her mother; Liver cancer in her brother; Stroke in her maternal grandmother; Vitiligo in her mother. ?No Known Allergies ?Current Outpatient Medications on File Prior to Visit  ?Medication Sig Dispense Refill  ? acetaminophen (TYLENOL) 325 MG tablet Take 650 mg by mouth every 6 (six) hours as needed.    ? cholecalciferol (VITAMIN D3) 25 MCG (1000 UNIT) tablet Take 1,000 Units by mouth daily.    ? Cyanocobalamin (VITAMIN B-12 IJ) Inject as directed.    ? famotidine (PEPCID) 20 MG tablet Take 1 tablet (20 mg total) by mouth at bedtime. 30 tablet 11  ? FLAXSEED, LINSEED, PO Take by mouth.    ? fluticasone (FLONASE) 50 MCG/ACT nasal spray Place  2 sprays into both nostrils daily.    ? Multiple Vitamin (MULTIVITAMIN) tablet Take 1 tablet by mouth daily.    ? OMEGA-3 FATTY ACIDS-VITAMIN E PO Take by mouth.    ? ?No current facility-administered medications on file prior to visit.  ? ?     ROS:  All others reviewed and negative. ? ?Objective  ? ?     PE:  BP 120/68 (BP Location: Right Arm, Patient Position: Sitting, Cuff Size: Large)   Pulse 82   Temp 98.7 ?F (37.1 ?C) (Oral)   Ht '5\' 7"'$  (1.702 m)   Wt 218 lb (98.9 kg)   SpO2 99%   BMI 34.14 kg/m?  ? ?              Constitutional: Pt appears in NAD ?              HENT: Head: NCAT.  ?              Right Ear: External ear normal.   ?              Left Ear: External ear  normal.  Phaynrx with severe erythema and left tonsillar pillar pustular focus ?              Eyes: . Pupils are equal, round, and reactive to light. Conjunctivae and EOM are normal ?              Nose: without d/c or deformity ?              Neck: Neck supple. Gross normal ROM ?              Cardiovascular: Normal rate and regular rhythm.   ?              Pulmonary/Chest: Effort normal and breath sounds without rales or wheezing.  ?              Abd:  Soft, NT, ND, + BS, no organomegaly ?              Neurological: Pt is alert. At baseline orientation, motor grossly intact ?              Skin: Skin is warm. No rashes, no other new lesions, LE edema - none ?              Psychiatric: Pt behavior is normal without agitation  ? ?Micro: none ? ?Cardiac tracings I have personally interpreted today:  none ? ?Pertinent Radiological findings (summarize): none  ? ?Lab Results  ?Component Value Date  ? WBC 5.5 06/25/2019  ? HGB 13.2 06/25/2019  ? HCT 39.0 06/25/2019  ? PLT 227.0 06/25/2019  ? GLUCOSE 88 06/25/2019  ? CHOL 167 06/25/2019  ? TRIG 54.0 06/25/2019  ? HDL 59.40 06/25/2019  ? Hannah Cain 97 06/25/2019  ? ALT 12 06/25/2019  ? AST 15 06/25/2019  ? NA 135 06/25/2019  ? K 3.8 06/25/2019  ? CL 101 06/25/2019  ? CREATININE 0.80 06/25/2019  ? BUN 13 06/25/2019  ? CO2 26 06/25/2019  ? TSH 3.47 06/25/2019  ? ?Assessment/Plan:  ?Hannah Cain is a 51 y.o. White or Caucasian [1] female with  has a past medical history of ALLERGIC RHINITIS (03/17/2007), Arthritis, Basal cell carcinoma (BCC) (06/10/2017), GERD (gastroesophageal reflux disease), HLD (hyperlipidemia) (06/10/2017), Palpitations (11/12/2017), Symptomatic PVCs (06/11/2018), URI (07/19/2009), and Vitamin B 12 deficiency. ? ?Encounter for well adult exam with abnormal findings ?Age and  sex appropriate education and counseling updated with regular exercise and diet ?Referrals for preventative services - due for hep c screen, pt to call for pap soon ?Immunizations addressed  - declines shingrix ?Smoking counseling  - none needed ?Evidence for depression or other mood disorder - none significant ?Most recent labs reviewed. ?I have personally reviewed and have noted: ?1) the patient's medical and social history ?2) The patient's current medications and supplements ?3) The patient's height, weight, and BMI have been recorded in the chart ? ? ?B12 deficiency ?Pt to continue IM b12 ? ?Vitamin D deficiency ?Last vitamin D ?Lab Results  ?Component Value Date  ? VD25OH 30.55 06/25/2019  ? ?Low, to start oral replacement ? ? ?Acute upper respiratory infection ?Mild to mod, for antibx course,  to f/u any worsening symptoms or concerns ? ?Left shoulder pain ?Exam c/w probable left rotater cuff disorder, for sport med referral ? ?Followup: Return in about 1 year (around 07/11/2022). ? ?Cathlean Cower, MD 07/10/2021 8:42 PM ?[place] ?[place] ?Internal Medicine ?

## 2021-07-10 NOTE — Assessment & Plan Note (Signed)
Exam c/w probable left rotater cuff disorder, for sport med referral ?

## 2021-07-10 NOTE — Patient Instructions (Addendum)
Please take all new medication as prescribed - the antibiotic ? ?Please continue all other medications as before, and refills have been done if requested. ? ?Please continue the B12 shots as you are doing ? ?Please have the pharmacy call with any other refills you may need. ? ?Please continue your efforts at being more active, low cholesterol diet, and weight control. ? ?You are otherwise up to date with prevention measures today. ? ?Please keep your appointments with your specialists as you may have planned - GYN for the pelvic pain ? ?You will be contacted regarding the referral for: Sports Medicine ? ?Please go to the LAB at the blood drawing area for the tests to be done ? ?You will be contacted by phone if any changes need to be made immediately.  Otherwise, you will receive a letter about your results with an explanation, but please check with MyChart first. ? ?Please remember to sign up for MyChart if you have not done so, as this will be important to you in the future with finding out test results, communicating by private email, and scheduling acute appointments online when needed. ? ?Please make an Appointment to return for your 1 year visit, or sooner if needed, with Lab testing by Appointment as well, to be done about 3-5 days before at the [facility] (so this is for TWO appointments - please see the scheduling desk as you leave) ? ? ?Due to the ongoing Covid 19 pandemic, our lab now requires an appointment for any labs done at our office.  If you need labs done and do not have an appointment, please call our office ahead of time to schedule before presenting to the lab for your testing. ? ? ? ? ?

## 2021-07-10 NOTE — Assessment & Plan Note (Signed)
Mild to mod, for antibx course,  to f/u any worsening symptoms or concerns 

## 2021-07-10 NOTE — Assessment & Plan Note (Signed)
Pt to continue IM b12 ?

## 2021-07-10 NOTE — Assessment & Plan Note (Signed)
Age and sex appropriate education and counseling updated with regular exercise and diet ?Referrals for preventative services - due for hep c screen, pt to call for pap soon ?Immunizations addressed - declines shingrix ?Smoking counseling  - none needed ?Evidence for depression or other mood disorder - none significant ?Most recent labs reviewed. ?I have personally reviewed and have noted: ?1) the patient's medical and social history ?2) The patient's current medications and supplements ?3) The patient's height, weight, and BMI have been recorded in the chart ? ?

## 2021-07-11 LAB — HEPATITIS C ANTIBODY
Hepatitis C Ab: NONREACTIVE
SIGNAL TO CUT-OFF: 0.07 (ref <1.00)

## 2021-07-18 ENCOUNTER — Ambulatory Visit: Payer: BC Managed Care – PPO

## 2021-07-18 NOTE — Progress Notes (Signed)
? ? Hannah Cain D.Merril Abbe ?[facility] ?[place] ?Phone: [phone] ?  ?Assessment and Plan:   ?  ?1. Left shoulder pain, unspecified chronicity ?2. 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  Tolerated well per note below ?- Start HEP and PT  ?- Tylenol as needed for pain ?- X-ray obtained in clinic.  My interpretation: No acute fracture or dislocation.  Cortical irregularities near humeral head consistent with calcific tendinitis. ? ?Procedure: Subacromial Injection ?Side: Left ? ?Risks explained and consent was given verbally. The site was cleaned with alcohol prep. A steroid injection was performed from posterior approach using 28m of 1% lidocaine without epinephrine and 159mof kenalog '40mg'$ /ml. This was well tolerated and resulted in symptomatic relief.  Needle was removed, hemostasis achieved, and post injection instructions were explained.   Pt was advised to call or return to clinic if these symptoms worsen or fail to improve as anticipated.  ? ?Pertinent previous records reviewed include PCP note 07/10/21 ?  ?Follow Up: 3 weeks for reevaluation.  Could consider course of NSAIDs versus ultrasound versus discussion of removal of calcifications ?  ?Subjective:   ?I, Hannah Cain serving as a scEducation administratoror Doctor [employer] ?Chief Complaint: left shoulder pain  ? ?HPI:  ?07/19/2021 ?Patient is a 89ear old female complaining of left shoulder pain. Patient states 1 yr onset left shoudler pain, mild to mod, worse to abduct, scaption and lifting over things hurt also when she sleeps on it a funny way , better to rest. Her neck flares up from time to time thinks it was from a car wreck, did do PT neck got better but shoulder didn't,no MOI, does get numbness  when she is sitting a certain way  but doesn't happen very often, has been taking Advil and that helps , lidocaine patches and creams don't help much,  ? ?Relevant Historical Information: Hypertension ? ?Additional pertinent review of systems negative. ? ? ?Current Outpatient Medications:  ?  acetaminophen (TYLENOL) 325 MG tablet, Take 650 mg by mouth every 6 (six) hours as needed., Disp: , Rfl:  ?  cholecalciferol (VITAMIN D3) 25 MCG (1000 UNIT) tablet, Take 1,000 Units by mouth daily., Disp: , Rfl:  ?  Cyanocobalamin (VITAMIN B-12 IJ), Inject as directed., Disp: , Rfl:  ?  famotidine (PEPCID) 20 MG tablet, Take 1 tablet (20 mg total) by mouth at bedtime., Disp: 30 tablet, Rfl: 11 ?  FLAXSEED, LINSEED, PO, Take by mouth., Disp: , Rfl:  ?  fluticasone (FLONASE) 50 MCG/ACT nasal spray, Place 2 sprays into both nostrils daily., Disp: , Rfl:  ?  Multiple Vitamin (MULTIVITAMIN) tablet, Take 1 tablet by mouth daily., Disp: , Rfl:  ?  OMEGA-3 FATTY ACIDS-VITAMIN E PO, Take by mouth., Disp: , Rfl:   ? ?Objective:   ?  ?Vitals:  ? 07/19/21 0904  ?BP: 134/80  ?Pulse: 77  ?SpO2: 98%  ?Weight: 220 lb (99.8 kg)  ?Height: '5\' 7"'$  (1.702 m)  ?  ?  ?Body mass index is 34.46 kg/m?.  ?  ?Physical Exam:   ? ?Gen: Appears well, nad, nontoxic and pleasant ?Neuro:sensation intact, strength is 5/5 with df/pf/inv/ev, muscle tone wnl ?Skin: no suspicious lesion or defmority ?Psych: A&O, appropriate mood and affect ? ?Left shoulder: no deformity, swelling or muscle wasting ?  No scapular winging ?FF 180, abd 100, int 10, ext 90 ?NTTP over the [place], clavicle, ac, coracoid, biceps groove, humerus, deltoid, trapezius, cervical spine ?Positive empty can ?Neg  subscap liftoff, speeds,  ?Neg ant drawer, sulcus sign, apprehension ?Negative Spurling's test bilat ?FROM of neck  ? ? ?Electronically signed by:  ?Hannah Cain D.Merril Abbe ?[facility] ?9:56 AM 07/19/21 ?

## 2021-07-19 ENCOUNTER — Ambulatory Visit: Payer: BC Managed Care – PPO | Admitting: Sports Medicine

## 2021-07-19 ENCOUNTER — Ambulatory Visit (INDEPENDENT_AMBULATORY_CARE_PROVIDER_SITE_OTHER): Payer: BC Managed Care – PPO

## 2021-07-19 VITALS — BP 134/80 | HR 77 | Ht 67.0 in | Wt 220.0 lb

## 2021-07-19 DIAGNOSIS — M25512 Pain in left shoulder: Secondary | ICD-10-CM

## 2021-07-19 DIAGNOSIS — M7532 Calcific tendinitis of left shoulder: Secondary | ICD-10-CM | POA: Diagnosis not present

## 2021-07-19 DIAGNOSIS — E538 Deficiency of other specified B group vitamins: Secondary | ICD-10-CM

## 2021-07-19 MED ORDER — CYANOCOBALAMIN 1000 MCG/ML IJ SOLN
1000.0000 ug | Freq: Once | INTRAMUSCULAR | Status: AC
  Administered 2021-07-19: 1000 ug via INTRAMUSCULAR

## 2021-07-19 NOTE — Progress Notes (Signed)
Pt came into the office to receive her Vitamin B-12 injection. She tolerated the injection well. No questions or concerns.  ?

## 2021-07-19 NOTE — Patient Instructions (Addendum)
Good to see you  ?Pt referral  ?Shoulder HEP  ?3 week follow up  ?Marland Kitchen ?

## 2021-08-07 NOTE — Progress Notes (Signed)
? ?   Benito Mccreedy D.Merril Abbe ?[facility] ?[place] ?Phone: [phone] ?  ?Assessment and Plan:   ?  ?1. Left shoulder pain, unspecified chronicity ?2. 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  ?Pertinent previous records reviewed include none ?  ?Follow Up: 4 to 6 weeks for reevaluation.  If no improvement or worsening of symptoms, could discuss ultrasound-guided removal of calcifications ?  ?Subjective:   ?I, Pincus Badder, am serving as a Education administrator for Doctor [employer] ?  ?Chief Complaint: left shoulder pain  ?  ?HPI:  ?07/19/2021 ?Patient is a 51 year old female complaining of left shoulder pain. Patient states 1 yr onset left shoudler pain, mild to mod, worse to abduct, scaption and lifting over things hurt also when she sleeps on it a funny way , better to rest. Her neck flares up from time to time thinks it was from a car wreck, did do PT neck got better but shoulder didn't,no MOI, does get numbness when she is sitting a certain way  but doesn't happen very often, has been taking Advil and that helps , lidocaine patches and creams don't help much,  ?  ?08/09/2021 ?Patient states that she's doing alright, still having slight pain def not as bad as it was, muscles are sore more than it is pain, but pain is till there just not as bad  ? ? ?Relevant Historical Information: Hypertension ? ?Additional pertinent review of systems negative. ? ? ?Current Outpatient Medications:  ?  acetaminophen (TYLENOL) 325 MG tablet, Take 650 mg by mouth every 6 (six) hours as needed., Disp: , Rfl:  ?  cholecalciferol (VITAMIN D3) 25 MCG (1000 UNIT) tablet, Take 1,000 Units by mouth daily., Disp: , Rfl:  ?  Cyanocobalamin (VITAMIN B-12 IJ),  Inject as directed., Disp: , Rfl:  ?  famotidine (PEPCID) 20 MG tablet, Take 1 tablet (20 mg total) by mouth at bedtime., Disp: 30 tablet, Rfl: 11 ?  FLAXSEED, LINSEED, PO, Take by mouth., Disp: , Rfl:  ?  fluticasone (FLONASE) 50 MCG/ACT nasal spray, Place 2 sprays into both nostrils daily., Disp: , Rfl:  ?  Multiple Vitamin (MULTIVITAMIN) tablet, Take 1 tablet by mouth daily., Disp: , Rfl:  ?  OMEGA-3 FATTY ACIDS-VITAMIN E PO, Take by mouth., Disp: , Rfl:   ? ?Objective:   ?  ?Vitals:  ? 08/09/21 0809  ?BP: 136/80  ?Pulse: 73  ?SpO2: 97%  ?Weight: 217 lb (98.4 kg)  ?Height: '5\' 7"'$  (1.702 m)  ?  ?  ?Body mass index is 33.99 kg/m?.  ?  ?Physical Exam:   ? ?Gen: Appears well, nad, nontoxic and pleasant ?Neuro:sensation intact, strength is 5/5 with df/pf/inv/ev, muscle tone wnl ?Skin: no suspicious lesion or defmority ?Psych: A&O, appropriate mood and affect ? ?Left shoulder: no deformity, swelling or muscle wasting ?No scapular winging ?FF 180, abd 180 (painful from 50-100), int 0, ext 90 ?NTTP over the [place], clavicle, ac, coracoid, biceps groove, humerus, deltoid, trapezius, cervical spine ?Positive empty can ?Neg neer, hawkings, , subscap liftoff, speeds, obriens, crossarm ?Neg ant drawer, sulcus sign, apprehension ?Negative Spurling's test bilat ?FROM of neck  ? ? ?Electronically signed by:  ?Benito Mccreedy D.Merril Abbe ?[facility] ?8:23 AM 08/09/21 ?

## 2021-08-09 ENCOUNTER — Ambulatory Visit (INDEPENDENT_AMBULATORY_CARE_PROVIDER_SITE_OTHER): Payer: BC Managed Care – PPO | Admitting: Sports Medicine

## 2021-08-09 VITALS — BP 136/80 | HR 73 | Ht 67.0 in | Wt 217.0 lb

## 2021-08-09 DIAGNOSIS — M7532 Calcific tendinitis of left shoulder: Secondary | ICD-10-CM

## 2021-08-09 DIAGNOSIS — M25512 Pain in left shoulder: Secondary | ICD-10-CM | POA: Diagnosis not present

## 2021-08-09 NOTE — Patient Instructions (Addendum)
Good to see you  ?Continue Pt and HEP  ?Ibuprofen as needed ?4-6 week follow up  ?

## 2021-08-20 ENCOUNTER — Ambulatory Visit (INDEPENDENT_AMBULATORY_CARE_PROVIDER_SITE_OTHER): Payer: BC Managed Care – PPO

## 2021-08-20 DIAGNOSIS — E538 Deficiency of other specified B group vitamins: Secondary | ICD-10-CM | POA: Diagnosis not present

## 2021-08-20 MED ORDER — CYANOCOBALAMIN 1000 MCG/ML IJ SOLN
1000.0000 ug | Freq: Once | INTRAMUSCULAR | Status: AC
  Administered 2021-08-20: 1000 ug via INTRAMUSCULAR

## 2021-08-20 NOTE — Progress Notes (Signed)
Pt here for monthly B12 injection per Dr. Jenny Reichmann ? ?B12 1062mg given IM, and pt tolerated injection well. ? ?Next B12 injection scheduled for 09/21/21 ?

## 2021-09-13 NOTE — Progress Notes (Signed)
Hannah Cain D.[place] Phone: [phone]   Assessment and Plan:     1. Left shoulder pain, unspecified chronicity 2. Calcific tendonitis of left shoulder -Chronic with exacerbation, subsequent visit - Mild improvement in strength and mild decrease in pain with CSI on 07/19/2021, HEP, physical therapy, however patient continues to have daily pain that will flare and limit her ability to lift objects - Due to failure to improve with >6 weeks of conservative therapy, pain flares greater than 6/10 at times, x-ray consistent with calcific tendinitis, we will obtain left shoulder MRI with intra-articular contrast to see if there is any other pathology causing patient's discomfort   Pertinent previous records reviewed include none   Follow Up: 3 days after MRI to discuss results.  Could consider ultrasound-guided removal of calcifications versus PRP   Subjective:   I, Hannah Cain, am serving as a Education administrator for Hannah Cain   Chief Complaint: left shoulder pain    HPI:  07/19/2021 Patient is a 51 year old female complaining of left shoulder pain. Patient states 1 yr onset left shoudler pain, mild to mod, worse to abduct, scaption and lifting over things hurt also when she sleeps on it a funny way , better to rest. Her neck flares up from time to time thinks it was from a car wreck, did do PT neck got better but shoulder didn't,no MOI, does get numbness when she is sitting a certain way  but doesn't happen very often, has been taking Advil and that helps , lidocaine patches and creams don't help much,    08/09/2021 Patient states that she's doing alright, still having slight pain def not as bad as it was, muscles are sore more than it is pain, but pain is till there just not as bad   09/14/2021 Patient states that she is doing okay, sometimes it feels like its better sometimes it doesn't , is able to use it  more , a couple of weeks ago she had a flare of pain in the front and back of her shoulder and it is still lingering      Relevant Historical Information: Hypertension  Additional pertinent review of systems negative.   Current Outpatient Medications:    acetaminophen (TYLENOL) 325 MG tablet, Take 650 mg by mouth every 6 (six) hours as needed., Disp: , Rfl:    cholecalciferol (VITAMIN D3) 25 MCG (1000 UNIT) tablet, Take 1,000 Units by mouth daily., Disp: , Rfl:    Cyanocobalamin (VITAMIN B-12 IJ), Inject as directed., Disp: , Rfl:    famotidine (PEPCID) 20 MG tablet, Take 1 tablet (20 mg total) by mouth at bedtime., Disp: 30 tablet, Rfl: 11   FLAXSEED, LINSEED, PO, Take by mouth., Disp: , Rfl:    fluticasone (FLONASE) 50 MCG/ACT nasal spray, Place 2 sprays into both nostrils daily., Disp: , Rfl:    Multiple Vitamin (MULTIVITAMIN) tablet, Take 1 tablet by mouth daily., Disp: , Rfl:    OMEGA-3 FATTY ACIDS-VITAMIN E PO, Take by mouth., Disp: , Rfl:    Objective:     Vitals:   09/14/21 0757  BP: 138/80  Pulse: 65  SpO2: 100%  Weight: 222 lb (100.7 kg)  Height: '5\' 7"'$  (1.702 m)      Body mass index is 34.77 kg/m.    Physical Exam:    Gen: Appears well, nad, nontoxic and pleasant Neuro:sensation intact, strength is 5/5 with df/pf/inv/ev, muscle tone wnl  Skin: no suspicious lesion or defmority Psych: A&O, appropriate mood and affect   Left shoulder: no deformity, swelling or muscle wasting No scapular winging FF 180, abd 180 (painful from 50-100), int 0, ext 90 NTTP over the [place], clavicle, ac, coracoid, biceps groove, humerus, deltoid, trapezius, cervical spine Positive empty can Neg neer, hawkings, , subscap liftoff, speeds, obriens, crossarm Neg ant drawer, sulcus sign, apprehension Negative Spurling's test bilat FROM of neck    Electronically signed by:  Hannah Cain D.[facility] 8:14 AM 09/14/21

## 2021-09-14 ENCOUNTER — Telehealth: Payer: Self-pay | Admitting: *Deleted

## 2021-09-14 ENCOUNTER — Ambulatory Visit: Payer: BC Managed Care – PPO | Admitting: Sports Medicine

## 2021-09-14 VITALS — BP 138/80 | HR 65 | Ht 67.0 in | Wt 222.0 lb

## 2021-09-14 DIAGNOSIS — M7532 Calcific tendinitis of left shoulder: Secondary | ICD-10-CM

## 2021-09-14 DIAGNOSIS — M25512 Pain in left shoulder: Secondary | ICD-10-CM

## 2021-09-14 NOTE — Patient Instructions (Addendum)
Good to see you  MRI referral with intra-articular contrast  Follow up 3 days after your MRI to discuss your results

## 2021-09-14 NOTE — Telephone Encounter (Signed)
Error

## 2021-09-21 ENCOUNTER — Ambulatory Visit (INDEPENDENT_AMBULATORY_CARE_PROVIDER_SITE_OTHER): Payer: BC Managed Care – PPO

## 2021-09-21 DIAGNOSIS — E538 Deficiency of other specified B group vitamins: Secondary | ICD-10-CM

## 2021-09-21 MED ORDER — CYANOCOBALAMIN 1000 MCG/ML IJ SOLN
1000.0000 ug | Freq: Once | INTRAMUSCULAR | Status: AC
  Administered 2021-09-21: 1000 ug via INTRAMUSCULAR

## 2021-09-21 NOTE — Progress Notes (Signed)
After obtaining consent, and per orders of Dr. Jenny Reichmann, injection of B12 given by Max Sane. Patient tolerated injection well in right deltoid and instructed to report any adverse reaction to me immediately.

## 2021-09-28 ENCOUNTER — Ambulatory Visit
Admission: RE | Admit: 2021-09-28 | Discharge: 2021-09-28 | Disposition: A | Discharge status: Home / Self Care | Payer: BC Managed Care – PPO | Source: Ambulatory Visit | Attending: Sports Medicine | Admitting: Sports Medicine

## 2021-09-28 DIAGNOSIS — M7532 Calcific tendinitis of left shoulder: Secondary | ICD-10-CM

## 2021-09-28 DIAGNOSIS — M25512 Pain in left shoulder: Secondary | ICD-10-CM

## 2021-09-28 MED ORDER — IOPAMIDOL (ISOVUE-M 200) INJECTION 41%
12.0000 mL | Freq: Once | INTRAMUSCULAR | Status: AC
  Administered 2021-09-28: 12 mL via INTRA_ARTICULAR

## 2021-10-10 NOTE — Progress Notes (Unsigned)
    Benito Mccreedy D.[place] Phone: [phone]   Assessment and Plan:     There are no diagnoses linked to this encounter.  ***   Pertinent previous records reviewed include ***   Follow Up: ***     Subjective:   I, Hayzen Lorenson, am serving as a Education administrator for Doctor Glennon Mac   Chief Complaint: left shoulder pain    HPI:  07/19/2021 Patient is a 51 year old female complaining of left shoulder pain. Patient states 1 yr onset left shoudler pain, mild to mod, worse to abduct, scaption and lifting over things hurt also when she sleeps on it a funny way , better to rest. Her neck flares up from time to time thinks it was from a car wreck, did do PT neck got better but shoulder didn't,no MOI, does get numbness when she is sitting a certain way  but doesn't happen very often, has been taking Advil and that helps , lidocaine patches and creams don't help much,    08/09/2021 Patient states that she's doing alright, still having slight pain def not as bad as it was, muscles are sore more than it is pain, but pain is till there just not as bad    09/14/2021 Patient states that she is doing okay, sometimes it feels like its better sometimes it doesn't , is able to use it more , a couple of weeks ago she had a flare of pain in the front and back of her shoulder and it is still lingering    10/11/2021 Patient states    Relevant Historical Information: Hypertension  Additional pertinent review of systems negative.   Current Outpatient Medications:    acetaminophen (TYLENOL) 325 MG tablet, Take 650 mg by mouth every 6 (six) hours as needed., Disp: , Rfl:    cholecalciferol (VITAMIN D3) 25 MCG (1000 UNIT) tablet, Take 1,000 Units by mouth daily., Disp: , Rfl:    Cyanocobalamin (VITAMIN B-12 IJ), Inject as directed., Disp: , Rfl:    famotidine (PEPCID) 20 MG tablet, Take 1 tablet (20 mg total) by mouth at bedtime.,  Disp: 30 tablet, Rfl: 11   FLAXSEED, LINSEED, PO, Take by mouth., Disp: , Rfl:    fluticasone (FLONASE) 50 MCG/ACT nasal spray, Place 2 sprays into both nostrils daily., Disp: , Rfl:    Multiple Vitamin (MULTIVITAMIN) tablet, Take 1 tablet by mouth daily., Disp: , Rfl:    OMEGA-3 FATTY ACIDS-VITAMIN E PO, Take by mouth., Disp: , Rfl:    Objective:     There were no vitals filed for this visit.    There is no height or weight on file to calculate BMI.    Physical Exam:    ***   Electronically signed by:  Benito Mccreedy D.[facility] 7:46 AM 10/10/21

## 2021-10-11 ENCOUNTER — Ambulatory Visit: Payer: BC Managed Care – PPO | Admitting: Sports Medicine

## 2021-10-11 VITALS — BP 124/82 | HR 68 | Ht 67.0 in | Wt 224.0 lb

## 2021-10-11 DIAGNOSIS — M25512 Pain in left shoulder: Secondary | ICD-10-CM

## 2021-10-11 DIAGNOSIS — M7532 Calcific tendinitis of left shoulder: Secondary | ICD-10-CM | POA: Diagnosis not present

## 2021-10-11 NOTE — Patient Instructions (Addendum)
Good to see you  Read on PRP Call us back if you decide to proceed with PRP injection or ultrasound guided barbotage

## 2021-10-17 NOTE — Progress Notes (Signed)
Hannah Cain D.[facility] [address] [place] [address] Phone: [phone]   Assessment and Plan:     1. Left shoulder pain, unspecified chronicity 2. 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  PRP provided in left shoulder subacromial space and tolerated well per note below. - No NSAIDs or ice x3 days - Educated on strict rehab protocol over the next 6 weeks  Procedure: Subacromial Injection Side: Left  Risks explained and consent was given verbally. The site was cleaned with alcohol prep. A PRP injection was performed from posterior approach using 6mL. This was well tolerated and resulted in symptomatic relief.  Needle was removed, hemostasis achieved, and post injection instructions were explained.   Pt was advised to call or return to clinic if these symptoms worsen or fail to improve as anticipated.    Pertinent previous records reviewed include none   Follow Up: 3 to 4 weeks for reevaluation   Subjective:   I, Hannah Cain, am serving as a Neurosurgeon for Doctor Richardean Sale   Chief Complaint: left shoulder pain    HPI:  07/19/2021 Patient is a 51 year old female complaining of left shoulder pain. Patient states 1 yr onset left shoudler pain, mild to mod, worse to abduct, scaption and lifting over things hurt also when she sleeps on it a funny way , better to rest. Her neck flares up from time to time thinks it was from a car wreck, did do PT neck got better but shoulder didn't,no MOI, does get numbness when she is sitting a certain way  but doesn't happen very often, has been taking Advil and that helps , lidocaine patches and creams don't help much,    08/09/2021 Patient states that she's doing alright, still having slight pain def not as bad as it was, muscles are sore more than  it is pain, but pain is till there just not as bad    09/14/2021 Patient states that she is doing okay, sometimes it feels like its better sometimes it doesn't , is able to use it more , a couple of weeks ago she had a flare of pain in the front and back of her shoulder and it is still lingering    10/11/2021 Patient states that the shoulder is the same the pain still hits but the strength and endurance are improving   10/18/2021 Patient states ready for PRP  Relevant Historical Information: Hypertension   Additional pertinent review of systems negative.   Current Outpatient Medications:    acetaminophen (TYLENOL) 325 MG tablet, Take 650 mg by mouth every 6 (six) hours as needed., Disp: , Rfl:    cholecalciferol (VITAMIN D3) 25 MCG (1000 UNIT) tablet, Take 1,000 Units by mouth daily., Disp: , Rfl:    Cyanocobalamin (VITAMIN B-12 IJ), Inject as directed., Disp: , Rfl:    famotidine (PEPCID) 20 MG tablet, Take 1 tablet (20 mg total) by mouth at bedtime., Disp: 30 tablet, Rfl: 11   FLAXSEED, LINSEED, PO, Take by mouth., Disp: , Rfl:    fluticasone (FLONASE) 50 MCG/ACT nasal spray, Place 2 sprays into both nostrils daily., Disp: , Rfl:    Multiple Vitamin (MULTIVITAMIN) tablet, Take 1 tablet by mouth daily., Disp: , Rfl:    OMEGA-3 FATTY ACIDS-VITAMIN E PO, Take by mouth., Disp: , Rfl:    Objective:  Vitals:   10/18/21 0822  BP: 122/76  Pulse: 82  SpO2: 99%  Weight: 223 lb (101.2 kg)  Height: 5\' 7"  (1.702 m)      Body mass index is 34.93 kg/m.       Electronically signed by:  Hannah Cain D.[facility] 8:43 AM 10/18/21

## 2021-10-18 ENCOUNTER — Ambulatory Visit (INDEPENDENT_AMBULATORY_CARE_PROVIDER_SITE_OTHER): Payer: Self-pay | Admitting: Sports Medicine

## 2021-10-18 ENCOUNTER — Ambulatory Visit: Payer: Self-pay

## 2021-10-18 VITALS — BP 122/76 | HR 82 | Ht 67.0 in | Wt 223.0 lb

## 2021-10-18 DIAGNOSIS — M7532 Calcific tendinitis of left shoulder: Secondary | ICD-10-CM

## 2021-10-18 DIAGNOSIS — M25512 Pain in left shoulder: Secondary | ICD-10-CM

## 2021-10-18 NOTE — Patient Instructions (Addendum)
Good to see you  4 week follow up   

## 2021-10-22 ENCOUNTER — Ambulatory Visit (INDEPENDENT_AMBULATORY_CARE_PROVIDER_SITE_OTHER): Payer: BC Managed Care – PPO

## 2021-10-22 DIAGNOSIS — E538 Deficiency of other specified B group vitamins: Secondary | ICD-10-CM

## 2021-10-22 MED ORDER — CYANOCOBALAMIN 1000 MCG/ML IJ SOLN
1000.0000 ug | Freq: Once | INTRAMUSCULAR | Status: AC
  Administered 2021-10-22: 1000 ug via INTRAMUSCULAR

## 2021-10-22 NOTE — Progress Notes (Signed)
After obtaining consent, and per orders of Dr. Jenny Reichmann, injection of B12 was given on the Right deltoid by Marrian Salvage. Patient tolerated well and was  to report any adverse reaction to me immediately.

## 2021-11-09 ENCOUNTER — Ambulatory Visit (INDEPENDENT_AMBULATORY_CARE_PROVIDER_SITE_OTHER): Payer: BC Managed Care – PPO

## 2021-11-09 ENCOUNTER — Encounter: Payer: Self-pay | Admitting: Family Medicine

## 2021-11-09 ENCOUNTER — Ambulatory Visit: Payer: BC Managed Care – PPO | Admitting: Family Medicine

## 2021-11-09 VITALS — BP 136/82 | HR 70 | Temp 98.2°F | Ht 67.0 in | Wt 227.0 lb

## 2021-11-09 DIAGNOSIS — R058 Other specified cough: Secondary | ICD-10-CM

## 2021-11-09 DIAGNOSIS — R599 Enlarged lymph nodes, unspecified: Secondary | ICD-10-CM | POA: Diagnosis not present

## 2021-11-09 DIAGNOSIS — T781XXA Other adverse food reactions, not elsewhere classified, initial encounter: Secondary | ICD-10-CM | POA: Insufficient documentation

## 2021-11-09 DIAGNOSIS — J301 Allergic rhinitis due to pollen: Secondary | ICD-10-CM | POA: Insufficient documentation

## 2021-11-09 DIAGNOSIS — H1045 Other chronic allergic conjunctivitis: Secondary | ICD-10-CM | POA: Insufficient documentation

## 2021-11-09 MED ORDER — AZITHROMYCIN 250 MG PO TABS: ORAL_TABLET | ORAL | 0 refills | Status: AC

## 2021-11-09 MED ORDER — BENZONATATE 200 MG PO CAPS: 200.0000 mg | ORAL_CAPSULE | Freq: Two times a day (BID) | ORAL | 0 refills | Status: DC | PRN

## 2021-11-09 NOTE — Progress Notes (Signed)
Subjective:     Patient ID: Hannah Cain, female    DOB: 1970-11-26, 51 y.o.   MRN: [id number]  Chief Complaint  Patient presents with   Cough    Swollen lymph node under arm and productive and dy cough for about 3 weeks. On and off sore throat for 2 weeks. Has tried mucinex for 2 weeks.     Cough   Patient is in today for a 3 wk hx of sinus pressure, headache, nasal congestion, ear congestion, and cough productive of thick mucus at times. Cough is worse at night. Sinus symptoms improving, cough worsening.   No fever, chills, dizziness, chest pain, palpitations, shortness of breath, abdominal pain, N/V/D.   Has been taking Mucinex.   No hx of lung disease. Is not a smoker   Swollen and tender lymph node in her left axilla x 2 days. If was present approx 3 weeks ago but resolved.   Mammogram UTD.     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   URI 07/19/2009   Vitamin B 12 deficiency     Past Surgical History:  Procedure Laterality Date   BREAST BIOPSY Right 1990   MOUTH SURGERY      Family History  Problem Relation Age of Onset   Hypertension Mother    Hypothyroidism Mother    Vitiligo Mother    Colon cancer Brother 87       mets to liver   Liver cancer Brother    Breast cancer Maternal Grandmother    Colon polyps Maternal Grandmother    Stroke Maternal Grandmother    Hypertension Maternal Grandmother    Hyperlipidemia Maternal Grandmother    COPD Maternal Grandmother    Heart failure Maternal Grandmother    Rectal cancer Neg Hx    Stomach cancer Neg Hx     Social History   Socioeconomic History   Marital status:  Married    Spouse name: Not on file   Number of children: 2   Years of education: 16   Highest education level: Not on file  Occupational History   Occupation: Armed forces training and education officer  Tobacco Use   Smoking status: Never   Smokeless tobacco: Never  Vaping Use   Vaping Use: Never used  Substance and Sexual Activity   Alcohol use: No   Drug use: No   Sexual activity: Not on file  Other Topics Concern   Not on file  Social History Narrative   Right handed   One story home   Social Determinants of Health   Financial Resource Strain: Not on file  Food Insecurity: Not on file  Transportation Needs: Not on file  Physical Activity: Not on file  Stress: Not on file  Social Connections: Not on file  Intimate Partner Violence: Not on file    Outpatient Medications Prior to Visit  Medication Sig Dispense Refill   cholecalciferol (VITAMIN D3) 25 MCG (1000 UNIT) tablet Take 1,000 Units by mouth daily.     Cyanocobalamin (VITAMIN B-12 IJ) Inject as directed.     famotidine (PEPCID) 20 MG tablet Take 1 tablet (20 mg total) by  mouth at bedtime. 30 tablet 11   FLAXSEED, LINSEED, PO Take by mouth.     fluticasone (FLONASE) 50 MCG/ACT nasal spray Place 2 sprays into both nostrils daily.     Multiple Vitamin (MULTIVITAMIN) tablet Take 1 tablet by mouth daily.     OMEGA-3 FATTY ACIDS-VITAMIN E PO Take by mouth.     acetaminophen (TYLENOL) 325 MG tablet Take 650 mg by mouth every 6 (six) hours as needed. (Patient not taking: Reported on 11/09/2021)     No facility-administered medications prior to visit.    No Known Allergies  Review of Systems  Respiratory:  Positive for cough.        Objective:    Physical Exam Constitutional:      General: She is not in acute distress. HENT:     Right Ear: Tympanic membrane and ear canal normal.     Left Ear: Tympanic membrane normal.     Nose: Congestion present.     Mouth/Throat:     Mouth: Mucous membranes are moist.  Eyes:      Extraocular Movements: Extraocular movements intact.     Conjunctiva/sclera: Conjunctivae normal.     Pupils: Pupils are equal, round, and reactive to light.  Cardiovascular:     Rate and Rhythm: Normal rate and regular rhythm.  Pulmonary:     Effort: Pulmonary effort is normal.     Breath sounds: Normal breath sounds.  Chest:     Comments: Pea size tender lymph node in left axilla  Musculoskeletal:     Cervical back: Normal range of motion and neck supple. No tenderness.  Lymphadenopathy:     Cervical: No cervical adenopathy.     Upper Body:     Left upper body: Axillary adenopathy present.  Skin:    General: Skin is warm and dry.  Neurological:     General: No focal deficit present.     Mental Status: She is alert and oriented to person, place, and time.  Psychiatric:        Mood and Affect: Mood normal.        Behavior: Behavior normal.     BP 136/82 (BP Location: Left Arm, Patient Position: Sitting, Cuff Size: Large)   Pulse 70   Temp 98.2 F (36.8 C) (Temporal)   Ht '5\' 7"'$  (1.702 m)   Wt 227 lb (103 kg)   SpO2 98%   BMI 35.55 kg/m  Wt Readings from Last 3 Encounters:  11/09/21 227 lb (103 kg)  10/18/21 223 lb (101.2 kg)  10/11/21 224 lb (101.6 kg)       Assessment & Plan:   Problem List Items Addressed This Visit       Immune and Lymphatic   Enlarged lymph node    Left axillary lymph node was enlarged previously and resolved but now enlarged again.  She will continue to monitor this and follow-up if it is not back to baseline in the next 2 weeks.        Other   Cough present for greater than 3 weeks - Primary    Z-Pak and Tessalon Perles prescribed.  Discussed symptomatic treatment.  Chest x-ray ordered.  Follow-up if worsening or not back to baseline in approximately 10 days.      Relevant Medications   azithromycin (ZITHROMAX) 250 MG tablet   benzonatate (TESSALON) 200 MG capsule   Other Relevant Orders   DG Chest 2 View   Productive cough    I  am having Seth Bake  Francene Castle start on azithromycin and benzonatate. I am also having her maintain her (FLAXSEED, LINSEED, PO), Cyanocobalamin (VITAMIN B-12 IJ), fluticasone, multivitamin, cholecalciferol, OMEGA-3 FATTY ACIDS-VITAMIN E PO, famotidine, and acetaminophen.  Meds ordered this encounter  Medications   azithromycin (ZITHROMAX) 250 MG tablet    Sig: Take 2 tablets on day 1, then 1 tablet daily on days 2 through 5    Dispense:  6 tablet    Refill:  0    Order Specific Question:   Supervising Provider    Answer:   Pricilla Holm A [[id number]]   benzonatate (TESSALON) 200 MG capsule    Sig: Take 1 capsule (200 mg total) by mouth 2 (two) times daily as needed for cough.    Dispense:  20 capsule    Refill:  0    Order Specific Question:   Supervising Provider    Answer:   Pricilla Holm A [[id number]]

## 2021-11-09 NOTE — Patient Instructions (Signed)
Please go downstairs for a chest X ray before you leave.   Take the antibiotic as prescribed.   I also recommend taking an over the counter allergy medication such as Claritin, Zyrtec or Xyzal.   Stay well hydrated.   Follow up if worsening or not back to baseline in 2 weeks. This includes the swollen lymph node in your armpit.

## 2021-11-09 NOTE — Assessment & Plan Note (Signed)
Z-Pak and Tessalon Perles prescribed.  Discussed symptomatic treatment.  Chest x-ray ordered.  Follow-up if worsening or not back to baseline in approximately 10 days.

## 2021-11-09 NOTE — Assessment & Plan Note (Signed)
Left axillary lymph node was enlarged previously and resolved but now enlarged again.  She will continue to monitor this and follow-up if it is not back to baseline in the next 2 weeks.

## 2021-11-12 ENCOUNTER — Other Ambulatory Visit: Payer: Self-pay | Admitting: Family Medicine

## 2021-11-12 DIAGNOSIS — R058 Other specified cough: Secondary | ICD-10-CM

## 2021-11-12 DIAGNOSIS — R9389 Abnormal findings on diagnostic imaging of other specified body structures: Secondary | ICD-10-CM

## 2021-11-12 NOTE — Progress Notes (Signed)
Benito Mccreedy D.[place] Phone: [phone]   Assessment and Plan:     1. Left shoulder pain, unspecified chronicity 2. 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 Swollen lymph nodes  -Acute, uncertain etiology, initial visit - Patient has experienced a swollen left axillary lymph node 3 times over the past several months that generally lasts for 4 to 5 days and then resolves.  Swelling has occurred both before and after injections, so I feel it is unlikely to be related to CSI or PRP injections to left shoulder.  Patient denies beta symptoms.  She believes her last mammogram was in 06/2021. - Recommend following with PCP.  Patient has appointment later today.  Could potentially discuss lab work versus repeat mammogram  Pertinent previous records reviewed include none   Follow Up: 3 weeks for reevaluation of left shoulder.   Subjective:   I, Hannah Cain, am serving as a Education administrator for Doctor Glennon Mac   Chief Complaint: left shoulder pain    HPI:  07/19/2021 Patient is a 51 year old female complaining of left shoulder pain. Patient states 1 yr onset left shoudler pain, mild to mod, worse to abduct, scaption and lifting over things hurt also when she sleeps on it a funny way , better to rest. Her neck flares up from time to time thinks it was from a car wreck, did do PT neck got better but shoulder didn't,no MOI, does get numbness when she is sitting a certain way  but doesn't happen very often, has been taking Advil and that helps , lidocaine patches and creams don't help much,    08/09/2021 Patient states that she's doing alright, still having  slight pain def not as bad as it was, muscles are sore more than it is pain, but pain is till there just not as bad    09/14/2021 Patient states that she is doing okay, sometimes it feels like its better sometimes it doesn't , is able to use it more , a couple of weeks ago she had a flare of pain in the front and back of her shoulder and it is still lingering    10/11/2021 Patient states that the shoulder is the same the pain still hits but the strength and endurance are improving   10/18/2021 Patient states ready for PRP  11/20/2021 Patient states had a swollen lymph node under the arm, thinks things are doing alright , states muscle are tight around that arm and she isnt able loosen them is in PT      Relevant Historical Information: Hypertension     Additional pertinent review of systems negative.   Current Outpatient Medications:    acetaminophen (TYLENOL) 325 MG tablet, Take 650 mg by mouth every 6 (six) hours as needed., Disp: , Rfl:    benzonatate (TESSALON) 200 MG capsule, Take 1 capsule (200 mg total) by mouth 2 (two) times daily as needed for cough., Disp: 20 capsule, Rfl: 0   cholecalciferol (VITAMIN D3) 25 MCG (1000 UNIT) tablet, Take 1,000 Units by mouth daily., Disp: , Rfl:    Cyanocobalamin (VITAMIN B-12 IJ), Inject as directed., Disp: , Rfl:    famotidine (PEPCID) 20 MG tablet, Take 1 tablet (  20 mg total) by mouth at bedtime., Disp: 30 tablet, Rfl: 11   FLAXSEED, LINSEED, PO, Take by mouth., Disp: , Rfl:    fluticasone (FLONASE) 50 MCG/ACT nasal spray, Place 2 sprays into both nostrils daily., Disp: , Rfl:    Multiple Vitamin (MULTIVITAMIN) tablet, Take 1 tablet by mouth daily., Disp: , Rfl:    OMEGA-3 FATTY ACIDS-VITAMIN E PO, Take by mouth., Disp: , Rfl:    Objective:     Vitals:   11/20/21 0856  BP: 122/82  Pulse: 77  SpO2: 99%  Weight: 225 lb (102.1 kg)  Height: '5\' 7"'$  (1.702 m)      Body mass index is 35.24 kg/m.    Physical Exam:    Left shoulder: no  deformity, swelling or muscle wasting No scapular winging FF 180, abd 180 (mildly painful from 50-100), int 0, ext 90 NTTP over the [place], clavicle, ac, coracoid, biceps groove, humerus, deltoid, trapezius, cervical spine Positive empty can Neg neer, hawkings, , subscap liftoff, speeds, obriens, crossarm Neg ant drawer, sulcus sign, apprehension Negative Spurling's test bilat FROM of neck    Electronically signed by:  Benito Mccreedy D.[facility] 9:13 AM 11/20/21

## 2021-11-13 ENCOUNTER — Ambulatory Visit (INDEPENDENT_AMBULATORY_CARE_PROVIDER_SITE_OTHER): Payer: BC Managed Care – PPO

## 2021-11-13 ENCOUNTER — Encounter: Payer: Self-pay | Admitting: Family Medicine

## 2021-11-13 DIAGNOSIS — R9389 Abnormal findings on diagnostic imaging of other specified body structures: Secondary | ICD-10-CM | POA: Diagnosis not present

## 2021-11-13 DIAGNOSIS — R058 Other specified cough: Secondary | ICD-10-CM | POA: Diagnosis not present

## 2021-11-20 ENCOUNTER — Ambulatory Visit: Payer: BC Managed Care – PPO | Admitting: Sports Medicine

## 2021-11-20 ENCOUNTER — Ambulatory Visit (INDEPENDENT_AMBULATORY_CARE_PROVIDER_SITE_OTHER): Payer: BC Managed Care – PPO | Admitting: Family Medicine

## 2021-11-20 ENCOUNTER — Encounter: Payer: Self-pay | Admitting: Family Medicine

## 2021-11-20 VITALS — BP 136/82 | HR 69 | Temp 97.9°F | Ht 67.0 in | Wt 224.0 lb

## 2021-11-20 VITALS — BP 122/82 | HR 77 | Ht 67.0 in | Wt 225.0 lb

## 2021-11-20 DIAGNOSIS — R599 Enlarged lymph nodes, unspecified: Secondary | ICD-10-CM | POA: Diagnosis not present

## 2021-11-20 DIAGNOSIS — M7532 Calcific tendinitis of left shoulder: Secondary | ICD-10-CM | POA: Diagnosis not present

## 2021-11-20 DIAGNOSIS — Z87898 Personal history of other specified conditions: Secondary | ICD-10-CM

## 2021-11-20 DIAGNOSIS — J309 Allergic rhinitis, unspecified: Secondary | ICD-10-CM | POA: Diagnosis not present

## 2021-11-20 DIAGNOSIS — M25512 Pain in left shoulder: Secondary | ICD-10-CM | POA: Diagnosis not present

## 2021-11-20 LAB — CBC WITH DIFFERENTIAL/PLATELET
Basophils Absolute: 0 10*3/uL (ref 0.0–0.1)
Basophils Relative: 0.5 % (ref 0.0–3.0)
Eosinophils Absolute: 0.1 10*3/uL (ref 0.0–0.7)
Eosinophils Relative: 2.6 % (ref 0.0–5.0)
HCT: 41 % (ref 36.0–46.0)
Hemoglobin: 13.7 g/dL (ref 12.0–15.0)
Lymphocytes Relative: 27.2 % (ref 12.0–46.0)
Lymphs Abs: 1.5 10*3/uL (ref 0.7–4.0)
MCHC: 33.4 g/dL (ref 30.0–36.0)
MCV: 88.5 fl (ref 78.0–100.0)
Monocytes Absolute: 0.7 10*3/uL (ref 0.1–1.0)
Monocytes Relative: 11.9 % (ref 3.0–12.0)
Neutro Abs: 3.2 10*3/uL (ref 1.4–7.7)
Neutrophils Relative %: 57.8 % (ref 43.0–77.0)
Platelets: 232 10*3/uL (ref 150.0–400.0)
RBC: 4.63 Mil/uL (ref 3.87–5.11)
RDW: 14.3 % (ref 11.5–15.5)
WBC: 5.6 10*3/uL (ref 4.0–10.5)

## 2021-11-20 NOTE — Assessment & Plan Note (Signed)
Recent intermittent left axillary lymph node enlargement and tenderness. Resolved. Follow up with OB/GYN since her mammograms are done there or here if this becomes an issue again. Check CBC.

## 2021-11-20 NOTE — Assessment & Plan Note (Signed)
Continue allergy medications and f/u if symptoms worsen or persist.

## 2021-11-20 NOTE — Patient Instructions (Signed)
Please go downstairs for labs.   If you notice the lymph node swelling again, please reach out to your gynecologist for an exam and left breast and axilla ultrasound or reach out to me and I will order it at [facility].   Continue taking your allergy medications and follow up if you are getting worse or not back to baseline in the next week or two.

## 2021-11-20 NOTE — Patient Instructions (Addendum)
Good to see you  Continue PT and HEP  3 week follow up

## 2021-11-20 NOTE — Progress Notes (Signed)
Subjective:     Patient ID: Hannah Cain, female    DOB: 1970-12-04, 51 y.o.   MRN: [id number]  Chief Complaint  Patient presents with   lymph node swelling    Lymph node swelled back last Wednesday but went away again on Saturday and just wanted to get checked out    HPI Patient is in today for intermittent swollen and tender left axillary lymph node. States it has been normal for the past 2 days.   [facility] and mammograms done there and UTD. No breast pain or tenderness.   Continues having allergy symptoms. Taking Flonase and Allegra. Completed course of abx.   Denies fever, chills, dizziness, chest pain, palpitations, shortness of breath, abdominal pain, N/V/D.      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   URI 07/19/2009   Vitamin B 12 deficiency     Past Surgical History:  Procedure Laterality Date   BREAST BIOPSY Right 1990   MOUTH SURGERY      Family History  Problem Relation Age of Onset   Hypertension Mother    Hypothyroidism Mother    Vitiligo Mother    Colon cancer Brother 96       mets to liver   Liver cancer Brother    Breast cancer Maternal Grandmother    Colon polyps Maternal Grandmother    Stroke Maternal Grandmother    Hypertension Maternal Grandmother    Hyperlipidemia Maternal Grandmother    COPD Maternal Grandmother    Heart failure Maternal Grandmother    Rectal cancer Neg Hx    Stomach cancer Neg Hx     Social History   Socioeconomic History   Marital status: Married    Spouse name: Not on file   Number of children: 2   Years of education: 16   Highest education level: Not on file  Occupational History    Occupation: Armed forces training and education officer  Tobacco Use   Smoking status: Never   Smokeless tobacco: Never  Vaping Use   Vaping Use: Never used  Substance and Sexual Activity   Alcohol use: No   Drug use: No   Sexual activity: Not on file  Other Topics Concern   Not on file  Social History Narrative   Right handed   One story home   Social Determinants of Health   Financial Resource Strain: Not on file  Food Insecurity: Not on file  Transportation Needs: Not on file  Physical Activity: Not on file  Stress: Not on file  Social Connections: Not on file  Intimate Partner Violence: Not on file    Outpatient Medications Prior to Visit  Medication Sig Dispense Refill   acetaminophen (TYLENOL) 325 MG tablet Take 650 mg by mouth every 6 (six) hours as needed.     cholecalciferol (VITAMIN D3) 25 MCG (1000 UNIT) tablet Take 1,000 Units by mouth daily.     Cyanocobalamin (VITAMIN B-12 IJ) Inject as directed.     famotidine (PEPCID) 20 MG tablet Take 1 tablet (20 mg total) by mouth at bedtime. 30 tablet 11   Fexofenadine HCl (ALLEGRA ALLERGY PO) Take by mouth.  FLAXSEED, LINSEED, PO Take by mouth.     fluticasone (FLONASE) 50 MCG/ACT nasal spray Place 2 sprays into both nostrils daily.     Multiple Vitamin (MULTIVITAMIN) tablet Take 1 tablet by mouth daily.     OMEGA-3 FATTY ACIDS-VITAMIN E PO Take by mouth.     UNABLE TO FIND Apply topically daily. Med Name: flurouracil 5% cream     benzonatate (TESSALON) 200 MG capsule Take 1 capsule (200 mg total) by mouth 2 (two) times daily as needed for cough. 20 capsule 0   No facility-administered medications prior to visit.    No Known Allergies  ROS     Objective:    Physical Exam Constitutional:      General: She is not in acute distress.    Appearance: She is not ill-appearing.  Cardiovascular:     Rate and Rhythm: Normal rate.  Pulmonary:     Effort: Pulmonary effort is normal.  Lymphadenopathy:     Upper Body:     Right  upper body: No supraclavicular or axillary adenopathy.     Left upper body: No supraclavicular or axillary adenopathy.  Neurological:     Mental Status: She is alert.     BP 136/82 (BP Location: Left Arm, Patient Position: Sitting, Cuff Size: Large)   Pulse 69   Temp 97.9 F (36.6 C) (Temporal)   Ht '5\' 7"'$  (1.702 m)   Wt 224 lb (101.6 kg)   SpO2 99%   BMI 35.08 kg/m  Wt Readings from Last 3 Encounters:  11/20/21 224 lb (101.6 kg)  11/20/21 225 lb (102.1 kg)  11/09/21 227 lb (103 kg)       Assessment & Plan:   Problem List Items Addressed This Visit       Respiratory   Allergic rhinitis - Primary    Continue allergy medications and f/u if symptoms worsen or persist.         Other   H/O lymphadenopathy    Recent intermittent left axillary lymph node enlargement and tenderness. Resolved. Follow up with OB/GYN since her mammograms are done there or here if this becomes an issue again. Check CBC.       Relevant Orders   CBC with Differential/Platelet (Completed)    I have discontinued Merci Walthers. [place]'s benzonatate. I am also having her maintain her (FLAXSEED, LINSEED, PO), Cyanocobalamin (VITAMIN B-12 IJ), fluticasone, multivitamin, cholecalciferol, OMEGA-3 FATTY ACIDS-VITAMIN E PO, famotidine, acetaminophen, Fexofenadine HCl (ALLEGRA ALLERGY PO), and UNABLE TO FIND.  No orders of the defined types were placed in this encounter.

## 2021-11-23 ENCOUNTER — Ambulatory Visit: Payer: BC Managed Care – PPO

## 2021-11-26 ENCOUNTER — Ambulatory Visit (INDEPENDENT_AMBULATORY_CARE_PROVIDER_SITE_OTHER): Payer: BC Managed Care – PPO

## 2021-11-26 DIAGNOSIS — E538 Deficiency of other specified B group vitamins: Secondary | ICD-10-CM | POA: Diagnosis not present

## 2021-11-26 MED ORDER — CYANOCOBALAMIN 1000 MCG/ML IJ SOLN
1000.0000 ug | Freq: Once | INTRAMUSCULAR | Status: AC
  Administered 2021-11-26: 1000 ug via INTRAMUSCULAR

## 2021-11-26 NOTE — Progress Notes (Signed)
After obtaining consent, and per orders of Dr. Jenny Reichmann, injection of B12 given on right deltoid by Marrian Salvage. Patient instructed to report any adverse reaction to me immediately.

## 2021-12-07 NOTE — Progress Notes (Unsigned)
    Benito Mccreedy D.[place] Phone: [phone]   Assessment and Plan:     There are no diagnoses linked to this encounter.  ***   Pertinent previous records reviewed include ***   Follow Up: ***     Subjective:   I, Dahir Ayer, am serving as a Education administrator for Doctor Glennon Mac   Chief Complaint: left shoulder pain    HPI:  07/19/2021 Patient is a 50 year old female complaining of left shoulder pain. Patient states 1 yr onset left shoudler pain, mild to mod, worse to abduct, scaption and lifting over things hurt also when she sleeps on it a funny way , better to rest. Her neck flares up from time to time thinks it was from a car wreck, did do PT neck got better but shoulder didn't,no MOI, does get numbness when she is sitting a certain way  but doesn't happen very often, has been taking Advil and that helps , lidocaine patches and creams don't help much,    08/09/2021 Patient states that she's doing alright, still having slight pain def not as bad as it was, muscles are sore more than it is pain, but pain is till there just not as bad    09/14/2021 Patient states that she is doing okay, sometimes it feels like its better sometimes it doesn't , is able to use it more , a couple of weeks ago she had a flare of pain in the front and back of her shoulder and it is still lingering    10/11/2021 Patient states that the shoulder is the same the pain still hits but the strength and endurance are improving   10/18/2021 Patient states ready for PRP   11/20/2021 Patient states had a swollen lymph node under the arm, thinks things are doing alright , states muscle are tight around that arm and she isnt able loosen them is in PT      12/11/2021 Patient states  Relevant Historical Information: Hypertension  Additional pertinent review of systems negative.   Current Outpatient Medications:    acetaminophen  (TYLENOL) 325 MG tablet, Take 650 mg by mouth every 6 (six) hours as needed., Disp: , Rfl:    cholecalciferol (VITAMIN D3) 25 MCG (1000 UNIT) tablet, Take 1,000 Units by mouth daily., Disp: , Rfl:    Cyanocobalamin (VITAMIN B-12 IJ), Inject as directed., Disp: , Rfl:    famotidine (PEPCID) 20 MG tablet, Take 1 tablet (20 mg total) by mouth at bedtime., Disp: 30 tablet, Rfl: 11   Fexofenadine HCl (ALLEGRA ALLERGY PO), Take by mouth., Disp: , Rfl:    FLAXSEED, LINSEED, PO, Take by mouth., Disp: , Rfl:    fluticasone (FLONASE) 50 MCG/ACT nasal spray, Place 2 sprays into both nostrils daily., Disp: , Rfl:    Multiple Vitamin (MULTIVITAMIN) tablet, Take 1 tablet by mouth daily., Disp: , Rfl:    OMEGA-3 FATTY ACIDS-VITAMIN E PO, Take by mouth., Disp: , Rfl:    UNABLE TO FIND, Apply topically daily. Med Name: flurouracil 5% cream, Disp: , Rfl:    Objective:     There were no vitals filed for this visit.    There is no height or weight on file to calculate BMI.    Physical Exam:    ***   Electronically signed by:  Benito Mccreedy D.[facility] 7:45 AM 12/07/21

## 2021-12-11 ENCOUNTER — Ambulatory Visit: Payer: BC Managed Care – PPO | Admitting: Sports Medicine

## 2021-12-11 VITALS — BP 120/80 | HR 77 | Ht 67.0 in | Wt 226.0 lb

## 2021-12-11 DIAGNOSIS — M25512 Pain in left shoulder: Secondary | ICD-10-CM | POA: Diagnosis not present

## 2021-12-11 DIAGNOSIS — R599 Enlarged lymph nodes, unspecified: Secondary | ICD-10-CM

## 2021-12-11 DIAGNOSIS — M7532 Calcific tendinitis of left shoulder: Secondary | ICD-10-CM

## 2021-12-11 NOTE — Patient Instructions (Signed)
Good to see you   

## 2021-12-26 ENCOUNTER — Ambulatory Visit: Payer: BC Managed Care – PPO

## 2021-12-27 ENCOUNTER — Ambulatory Visit (INDEPENDENT_AMBULATORY_CARE_PROVIDER_SITE_OTHER): Payer: BC Managed Care – PPO

## 2021-12-27 DIAGNOSIS — E538 Deficiency of other specified B group vitamins: Secondary | ICD-10-CM | POA: Diagnosis not present

## 2021-12-27 MED ORDER — CYANOCOBALAMIN 1000 MCG/ML IJ SOLN
1000.0000 ug | Freq: Once | INTRAMUSCULAR | Status: AC
  Administered 2021-12-27: 1000 ug via INTRAMUSCULAR

## 2021-12-27 NOTE — Progress Notes (Signed)
After obtaining consent, and per orders of Dr. Jenny Reichmann, injection of B12 given by Max Sane. Patient tolerated injection well in right deltoid, and instructed to report any adverse reaction to me immediately.

## 2022-01-28 ENCOUNTER — Ambulatory Visit: Payer: BC Managed Care – PPO

## 2022-01-28 ENCOUNTER — Ambulatory Visit (INDEPENDENT_AMBULATORY_CARE_PROVIDER_SITE_OTHER): Payer: BC Managed Care – PPO

## 2022-01-28 DIAGNOSIS — E538 Deficiency of other specified B group vitamins: Secondary | ICD-10-CM

## 2022-01-28 MED ORDER — CYANOCOBALAMIN 1000 MCG/ML IJ SOLN
1000.0000 ug | Freq: Once | INTRAMUSCULAR | Status: AC
  Administered 2022-01-28: 1000 ug via INTRAMUSCULAR

## 2022-01-28 NOTE — Progress Notes (Signed)
After obtaining consent, and per orders of Dr. Jenny Reichmann, injection of B12 given in the left deltoid  by Marrian Salvage. Patient instructed to report any adverse reaction to me immediately.

## 2022-03-04 ENCOUNTER — Ambulatory Visit: Payer: BC Managed Care – PPO

## 2022-03-04 ENCOUNTER — Ambulatory Visit (INDEPENDENT_AMBULATORY_CARE_PROVIDER_SITE_OTHER): Payer: BC Managed Care – PPO

## 2022-03-04 DIAGNOSIS — E538 Deficiency of other specified B group vitamins: Secondary | ICD-10-CM

## 2022-03-04 MED ORDER — CYANOCOBALAMIN 1000 MCG/ML IJ SOLN
1000.0000 ug | Freq: Once | INTRAMUSCULAR | Status: AC
  Administered 2022-03-04: 1000 ug via INTRAMUSCULAR

## 2022-03-04 NOTE — Progress Notes (Signed)
After obtaining consent, and per orders of Dr. Jenny Reichmann, injection of B12 given by in the right deltoid Hannah Cain. Patient instructed to remain in clinic for 20 minutes afterwards, and to report any adverse reaction to me immediately.

## 2022-04-03 ENCOUNTER — Ambulatory Visit (INDEPENDENT_AMBULATORY_CARE_PROVIDER_SITE_OTHER): Payer: BC Managed Care – PPO

## 2022-04-03 DIAGNOSIS — E538 Deficiency of other specified B group vitamins: Secondary | ICD-10-CM

## 2022-04-03 MED ORDER — CYANOCOBALAMIN 1000 MCG/ML IJ SOLN
1000.0000 ug | Freq: Once | INTRAMUSCULAR | Status: AC
  Administered 2022-04-03: 1000 ug via INTRAMUSCULAR

## 2022-04-03 NOTE — Progress Notes (Signed)
After obtaining consent, and per orders of Dr. John, injection of B12 given by Dayanne Yiu P Katilynn Sinkler. Patient instructed to report any adverse reaction to me immediately.  

## 2022-05-01 ENCOUNTER — Ambulatory Visit: Payer: BC Managed Care – PPO | Admitting: Internal Medicine

## 2022-05-01 VITALS — BP 122/78 | HR 84 | Temp 98.4°F | Ht 67.0 in | Wt 238.1 lb

## 2022-05-01 DIAGNOSIS — I10 Essential (primary) hypertension: Secondary | ICD-10-CM | POA: Diagnosis not present

## 2022-05-01 DIAGNOSIS — E6609 Other obesity due to excess calories: Secondary | ICD-10-CM | POA: Diagnosis not present

## 2022-05-01 DIAGNOSIS — R229 Localized swelling, mass and lump, unspecified: Secondary | ICD-10-CM

## 2022-05-01 DIAGNOSIS — R599 Enlarged lymph nodes, unspecified: Secondary | ICD-10-CM

## 2022-05-01 MED ORDER — ZEPBOUND 2.5 MG/0.5ML ~~LOC~~ SOAJ
2.5000 mg | SUBCUTANEOUS | 11 refills | Status: DC
Start: 2022-05-01 — End: 2022-07-12

## 2022-05-01 NOTE — Patient Instructions (Addendum)
Please take all new medication as prescribed -the zepbound (the same thing as mounjaro)  You will be contacted regarding the referral for: Health and Wellness - Weight Management  Ok to try Swimming, Elliptical or stationary bike to help with the knees  Please continue all other medications as before, and refills have been done if requested.  Please have the pharmacy call with any other refills you may need.  Please continue your efforts at being more active, low cholesterol diet, and weight control  Please keep your appointments with your specialists as you may have planned

## 2022-05-01 NOTE — Progress Notes (Unsigned)
Patient ID: Hannah Cain, female   DOB: 1970/11/22, 51 y.o.   MRN: [id number]        Chief Complaint: follow up obesity, left axillary LA, right post neck skin nodule       HPI:  Hannah Cain is a 52 y.o. female here with c/o unable to lose wt with diet and exercise.  Asking for pharm consdieration.  Pt denies chest pain, increased sob or doe, wheezing, orthopnea, PND, increased LE swelling, palpitations, dizziness or syncope.   Pt denies polydipsia, polyuria, or new focal neuro s/s.    Pt denies fever, night sweats, loss of appetite, or other constitutional symptoms  Hard to get wt down with diet and exercise, has ongoing right knee arthritis that slows her.  Did see nutritionist.  Does have a recurring tender LN near to left axilla currently not active off and on for several months.  Also has a right upper post neck nodule for about 1.5 yrs without change, wondering if this is something significant.  .    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 She has never used smokeless tobacco. She reports that she does not drink alcohol and does not use drugs. family history includes Breast cancer in her maternal grandmother; COPD in her maternal grandmother; Colon cancer (age of onset: 6) in her brother; Colon polyps in her maternal grandmother; Heart failure in her maternal grandmother;  Hyperlipidemia in her maternal grandmother; Hypertension in her maternal grandmother and mother; Hypothyroidism in her mother; Liver cancer in her brother; Stroke in her maternal grandmother; Vitiligo in her mother. No Known Allergies Current Outpatient Medications on File Prior to Visit  Medication Sig Dispense Refill   cholecalciferol (VITAMIN D3) 25 MCG (1000 UNIT) tablet Take 1,000 Units by mouth daily.     Cyanocobalamin (VITAMIN B-12 IJ) Inject as directed.     famotidine (PEPCID) 20 MG tablet Take 1 tablet (20 mg total) by mouth at bedtime. 30 tablet 11   Fexofenadine HCl (ALLEGRA ALLERGY PO) Take by mouth.     FLAXSEED, LINSEED, PO Take by mouth.     fluticasone (FLONASE) 50 MCG/ACT nasal spray Place 2 sprays into both nostrils daily.     Multiple Vitamin (MULTIVITAMIN) tablet Take 1 tablet by mouth daily.     norethindrone (MICRONOR) 0.35 MG tablet Take 1 tablet by mouth daily.     OMEGA-3 FATTY ACIDS-VITAMIN E PO Take by mouth.     No current facility-administered medications on file prior to visit.        ROS:  All others reviewed and negative.  Objective        PE:  BP 122/78   Pulse 84   Temp 98.4 F (36.9 C) (Temporal)   Ht 5'  7" (1.702 m)   Wt 238 lb 2 oz (108 kg)   SpO2 96%   BMI 37.30 kg/m                 Constitutional: Pt appears in NAD               HENT: Head: NCAT.                Right Ear: External ear normal.                 Left Ear: External ear normal.                Eyes: . Pupils are equal, round, and reactive to light. Conjunctivae and EOM are normal               Nose: without d/c or deformity               Neck: Neck supple. Gross normal ROM               Cardiovascular: Normal rate and regular rhythm.                 Pulmonary/Chest: Effort normal and breath sounds without rales or wheezing.                Abd:  Soft, NT, ND, + BS, no organomegaly               Neurological: Pt is alert. At baseline orientation, motor grossly intact                Skin: Skin is warm.  LE edema - none axillary tender LA, and right upper post neck firm subq nodule < 1 cm, mobile and no overlying skin change               Psychiatric: Pt behavior is normal without agitation   Micro: none  Cardiac tracings I have personally interpreted today:  none  Pertinent Radiological findings (summarize): none   Lab Results  Component Value Date   WBC 5.6 11/20/2021   HGB 13.7 11/20/2021   HCT 41.0 11/20/2021   PLT 232.0 11/20/2021   GLUCOSE 91 07/10/2021   CHOL 190 07/10/2021   TRIG 74.0 07/10/2021   HDL 69.30 07/10/2021   LDLCALC 106 (H) 07/10/2021   ALT 12 07/10/2021   AST 17 07/10/2021   NA 137 07/10/2021   K 4.2 07/10/2021   CL 101 07/10/2021   CREATININE 0.79 07/10/2021   BUN 10 07/10/2021   CO2 27 07/10/2021   TSH 2.85 07/10/2021   Assessment/Plan:  [name] is a 52 y.o. White or Caucasian [1] female with  has a past medical history of ALLERGIC RHINITIS (03/17/2007), Arthritis, Basal cell carcinoma (BCC) (06/10/2017), GERD (gastroesophageal reflux disease), HLD (hyperlipidemia) (06/10/2017), Palpitations (11/12/2017), Symptomatic PVCs (06/11/2018), URI (07/19/2009), and Vitamin B 12 deficiency.  Obesity due to excess calories Worsening despite seeing nutritinist; for referral Wt management clinic, try to start mounjaro 2.5 mg weekly if ok with insurance, and to try non wt bearing exercise such as swelling, stationary bike, elliptical  Enlarged lymph node None current, etiology unclear, no evidence for concern for malignancy at this time,  to f/u any worsening symptoms or concerns  Skin nodule Right post neck, chronic persistent, benign appearing, no other surrounding mass or LA, ok  to f/u any worsening symptoms or concerns  Hypertension BP Readings from Last 3 Encounters:  05/01/22 122/78  12/11/21 120/80  11/20/21 136/82   Stable, pt to continue medical treatment  - diet, wt control, low salt   Followup: Return if symptoms  worsen or fail to improve.  Cathlean Cower, MD 05/02/2022 9:14 PM [facility]

## 2022-05-02 ENCOUNTER — Encounter: Payer: Self-pay | Admitting: Internal Medicine

## 2022-05-02 DIAGNOSIS — R229 Localized swelling, mass and lump, unspecified: Secondary | ICD-10-CM | POA: Insufficient documentation

## 2022-05-02 DIAGNOSIS — E6609 Other obesity due to excess calories: Secondary | ICD-10-CM | POA: Insufficient documentation

## 2022-05-02 NOTE — Assessment & Plan Note (Signed)
Right post neck, chronic persistent, benign appearing, no other surrounding mass or LA, ok  to f/u any worsening symptoms or concerns

## 2022-05-02 NOTE — Assessment & Plan Note (Signed)
None current, etiology unclear, no evidence for concern for malignancy at this time,  to f/u any worsening symptoms or concerns

## 2022-05-02 NOTE — Assessment & Plan Note (Signed)
Worsening despite seeing nutritinist; for referral Wt management clinic, try to start mounjaro 2.5 mg weekly if ok with insurance, and to try non wt bearing exercise such as swelling, stationary bike, elliptical

## 2022-05-02 NOTE — Assessment & Plan Note (Signed)
BP Readings from Last 3 Encounters:  05/01/22 122/78  12/11/21 120/80  11/20/21 136/82   Stable, pt to continue medical treatment  - diet, wt control, low salt

## 2022-05-06 ENCOUNTER — Ambulatory Visit (INDEPENDENT_AMBULATORY_CARE_PROVIDER_SITE_OTHER): Payer: BC Managed Care – PPO

## 2022-05-06 DIAGNOSIS — E538 Deficiency of other specified B group vitamins: Secondary | ICD-10-CM | POA: Diagnosis not present

## 2022-05-06 MED ORDER — CYANOCOBALAMIN 1000 MCG/ML IJ SOLN
1000.0000 ug | Freq: Once | INTRAMUSCULAR | Status: AC
  Administered 2022-05-06: 1000 ug via INTRAMUSCULAR

## 2022-05-06 NOTE — Progress Notes (Signed)
After obtaining consent, and per orders of Dr. John, injection of B12 given by Kingslee Mairena P Nayleen Janosik. Patient instructed to report any adverse reaction to me immediately.  

## 2022-06-10 ENCOUNTER — Ambulatory Visit (INDEPENDENT_AMBULATORY_CARE_PROVIDER_SITE_OTHER): Payer: BC Managed Care – PPO

## 2022-06-10 DIAGNOSIS — E538 Deficiency of other specified B group vitamins: Secondary | ICD-10-CM | POA: Diagnosis not present

## 2022-06-10 MED ORDER — CYANOCOBALAMIN 1000 MCG/ML IJ SOLN
1000.0000 ug | Freq: Once | INTRAMUSCULAR | Status: AC
  Administered 2022-06-10: 1000 ug via INTRAMUSCULAR

## 2022-06-10 NOTE — Progress Notes (Signed)
After obtaining consent, and per orders of Dr. Jenny Reichmann, injection of B12 given by Marrian Salvage. Patient instructed to report any adverse reaction to me immediately.

## 2022-07-03 ENCOUNTER — Other Ambulatory Visit (INDEPENDENT_AMBULATORY_CARE_PROVIDER_SITE_OTHER): Payer: BC Managed Care – PPO

## 2022-07-03 DIAGNOSIS — E538 Deficiency of other specified B group vitamins: Secondary | ICD-10-CM | POA: Diagnosis not present

## 2022-07-03 DIAGNOSIS — Z0001 Encounter for general adult medical examination with abnormal findings: Secondary | ICD-10-CM | POA: Diagnosis not present

## 2022-07-03 DIAGNOSIS — E559 Vitamin D deficiency, unspecified: Secondary | ICD-10-CM

## 2022-07-03 DIAGNOSIS — R739 Hyperglycemia, unspecified: Secondary | ICD-10-CM | POA: Diagnosis not present

## 2022-07-03 LAB — CBC WITH DIFFERENTIAL/PLATELET
Basophils Absolute: 0 10*3/uL (ref 0.0–0.1)
Basophils Relative: 0.4 % (ref 0.0–3.0)
Eosinophils Absolute: 0.2 10*3/uL (ref 0.0–0.7)
Eosinophils Relative: 3.7 % (ref 0.0–5.0)
HCT: 44 % (ref 36.0–46.0)
Hemoglobin: 14.7 g/dL (ref 12.0–15.0)
Lymphocytes Relative: 35.9 % (ref 12.0–46.0)
Lymphs Abs: 1.9 10*3/uL (ref 0.7–4.0)
MCHC: 33.3 g/dL (ref 30.0–36.0)
MCV: 89.1 fl (ref 78.0–100.0)
Monocytes Absolute: 0.7 10*3/uL (ref 0.1–1.0)
Monocytes Relative: 13.5 % — ABNORMAL HIGH (ref 3.0–12.0)
Neutro Abs: 2.5 10*3/uL (ref 1.4–7.7)
Neutrophils Relative %: 46.5 % (ref 43.0–77.0)
Platelets: 217 10*3/uL (ref 150.0–400.0)
RBC: 4.94 Mil/uL (ref 3.87–5.11)
RDW: 13.7 % (ref 11.5–15.5)
WBC: 5.4 10*3/uL (ref 4.0–10.5)

## 2022-07-03 LAB — URINALYSIS, ROUTINE W REFLEX MICROSCOPIC
Bilirubin Urine: NEGATIVE
Hgb urine dipstick: NEGATIVE
Ketones, ur: NEGATIVE
Leukocytes,Ua: NEGATIVE
Nitrite: NEGATIVE
RBC / HPF: NONE SEEN (ref 0–?)
Specific Gravity, Urine: 1.01 (ref 1.000–1.030)
Total Protein, Urine: NEGATIVE
Urine Glucose: NEGATIVE
Urobilinogen, UA: 0.2 (ref 0.0–1.0)
pH: 6.5 (ref 5.0–8.0)

## 2022-07-03 LAB — BASIC METABOLIC PANEL
BUN: 14 mg/dL (ref 6–23)
CO2: 28 mEq/L (ref 19–32)
Calcium: 9.7 mg/dL (ref 8.4–10.5)
Chloride: 100 mEq/L (ref 96–112)
Creatinine, Ser: 0.86 mg/dL (ref 0.40–1.20)
GFR: 78.42 mL/min (ref >60.00)
Glucose, Bld: 98 mg/dL (ref 70–99)
Potassium: 4.7 mEq/L (ref 3.5–5.1)
Sodium: 138 mEq/L (ref 135–145)

## 2022-07-03 LAB — HEPATIC FUNCTION PANEL
ALT: 15 U/L (ref 0–35)
AST: 20 U/L (ref 0–37)
Albumin: 4.6 g/dL (ref 3.5–5.2)
Alkaline Phosphatase: 75 U/L (ref 39–117)
Bilirubin, Direct: 0.2 mg/dL (ref 0.0–0.3)
Total Bilirubin: 0.7 mg/dL (ref 0.2–1.2)
Total Protein: 8 g/dL (ref 6.0–8.3)

## 2022-07-03 LAB — VITAMIN B12: Vitamin B-12: 484 pg/mL (ref 211–911)

## 2022-07-03 LAB — LIPID PANEL
Cholesterol: 196 mg/dL (ref 0–200)
HDL: 65.7 mg/dL (ref >39.00)
LDL Cholesterol: 118 mg/dL — ABNORMAL HIGH (ref 0–99)
NonHDL: 130.54
Total CHOL/HDL Ratio: 3
Triglycerides: 63 mg/dL (ref 0.0–149.0)
VLDL: 12.6 mg/dL (ref 0.0–40.0)

## 2022-07-03 LAB — TSH: TSH: 2.33 u[IU]/mL (ref 0.35–5.50)

## 2022-07-03 LAB — VITAMIN D 25 HYDROXY (VIT D DEFICIENCY, FRACTURES): VITD: 29.21 ng/mL — ABNORMAL LOW (ref 30.00–100.00)

## 2022-07-03 LAB — HEMOGLOBIN A1C: Hgb A1c MFr Bld: 5.4 % (ref 4.6–6.5)

## 2022-07-12 ENCOUNTER — Ambulatory Visit (INDEPENDENT_AMBULATORY_CARE_PROVIDER_SITE_OTHER): Payer: BC Managed Care – PPO | Admitting: Internal Medicine

## 2022-07-12 ENCOUNTER — Encounter: Payer: Self-pay | Admitting: Internal Medicine

## 2022-07-12 VITALS — BP 128/72 | HR 73 | Temp 98.3°F | Ht 67.0 in | Wt 241.0 lb

## 2022-07-12 DIAGNOSIS — E7800 Pure hypercholesterolemia, unspecified: Secondary | ICD-10-CM | POA: Diagnosis not present

## 2022-07-12 DIAGNOSIS — E559 Vitamin D deficiency, unspecified: Secondary | ICD-10-CM

## 2022-07-12 DIAGNOSIS — I10 Essential (primary) hypertension: Secondary | ICD-10-CM

## 2022-07-12 DIAGNOSIS — M79672 Pain in left foot: Secondary | ICD-10-CM

## 2022-07-12 DIAGNOSIS — R739 Hyperglycemia, unspecified: Secondary | ICD-10-CM

## 2022-07-12 DIAGNOSIS — E538 Deficiency of other specified B group vitamins: Secondary | ICD-10-CM | POA: Diagnosis not present

## 2022-07-12 DIAGNOSIS — R9389 Abnormal findings on diagnostic imaging of other specified body structures: Secondary | ICD-10-CM

## 2022-07-12 DIAGNOSIS — Z0001 Encounter for general adult medical examination with abnormal findings: Secondary | ICD-10-CM

## 2022-07-12 MED ORDER — CYANOCOBALAMIN 1000 MCG/ML IJ SOLN
1000.0000 ug | Freq: Once | INTRAMUSCULAR | Status: AC
  Administered 2022-07-12: 1000 ug via INTRAMUSCULAR

## 2022-07-12 NOTE — Patient Instructions (Signed)
Ok to double the Vit D3 that you are taking at home  You had the B12 shot today  Please continue all other medications as before, and refills have been done if requested.  Please have the pharmacy call with any other refills you may need.  Please continue your efforts at being more active, low cholesterol diet, and weight control.  You are otherwise up to date with prevention measures today.  Please keep your appointments with your specialists as you may have planned  You will be contacted regarding the referral for: Cardiac CT score  You will be contacted regarding the referral for: Sports Medicine for the left foot and other joint pain  Please make an Appointment to return for your 1 year visit, or sooner if needed, with Lab testing by Appointment as well, to be done about 3-5 days before at the FIRST FLOOR Lab (so this is for TWO appointments - please see the scheduling desk as you leave)

## 2022-07-12 NOTE — Progress Notes (Signed)
Patient ID: Hannah Cain, female   DOB: 10/10/1970, 52 y.o.   MRN: [id number]         Chief Complaint:: wellness exam and Annual Exam (Left foot and leg pain) , low vit d and b12, hld,       HPI:  Hannah Cain is a 52 y.o. female here for wellness exam; trying to do 2 miles on treadmill at least 3 times per wk, Pt denies chest pain, increased sob or doe, wheezing, orthopnea, PND, increased LE swelling, palpitations, dizziness or syncope.   Pt denies polydipsia, polyuria, or new focal neuro s/s.    Pt denies fever, wt loss, night sweats, loss of appetite, or other constitutional symptoms    Taking low dose Vit D, not sure of the units.  Willing for Card CT score, has not been able to tolerate B12 oral due to nausea, asks for IM replacement.  Also has pain, tenderness to left plantar foot, now moderate , worse in the am with first steps.Pt to call soon for GYN exam pap and mammgoram. For shingrix at the pharmacy   Wt Readings from Last 3 Encounters:  07/12/22 241 lb (109.3 kg)  05/01/22 238 lb 2 oz (108 kg)  12/11/21 226 lb (102.5 kg)   BP Readings from Last 3 Encounters:  07/12/22 128/72  05/01/22 122/78  12/11/21 120/80   Immunization History  Administered Date(s) Administered   Influenza Split 03/11/2011, 12/30/2019   Influenza, High Dose Seasonal PF 07/26/2019, 02/29/2020   Influenza,inj,Quad PF,6+ Mos 12/31/2018   Influenza-Unspecified 01/05/2018, 01/19/2021   Moderna Sars-Covid-2 Vaccination 06/19/2019, 07/17/2019   PFIZER(Purple Top)SARS-COV-2 Vaccination 07/26/2019   Tdap 07/02/2018  There are no preventive care reminders to display for this patient.    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 She has never used smokeless tobacco. She reports that she does not drink alcohol and does not use drugs. family history includes Breast cancer in her maternal grandmother; COPD in her maternal grandmother; Colon cancer (age of onset: 8747) in her brother; Colon polyps in her maternal grandmother; Heart failure in her maternal grandmother; Hyperlipidemia in her maternal grandmother; Hypertension in her maternal grandmother and mother; Hypothyroidism in her mother; Liver cancer in her brother; Stroke in her maternal grandmother; Vitiligo in her mother. No Known Allergies Current Outpatient Medications on File Prior to Visit  Medication Sig Dispense Refill   cholecalciferol (VITAMIN D3) 25 MCG (1000 UNIT) tablet Take 1,000 Units by mouth daily.     Cyanocobalamin (VITAMIN B-12 IJ) Inject as directed.     famotidine (PEPCID) 20 MG tablet Take 1 tablet (20 mg total) by mouth at bedtime. 30 tablet 11   Fexofenadine HCl (ALLEGRA ALLERGY PO) Take by mouth.     FLAXSEED, LINSEED, PO Take by mouth.     fluticasone (FLONASE) 50 MCG/ACT nasal spray Place 2 sprays into both nostrils daily.     Multiple Vitamin (MULTIVITAMIN) tablet Take 1 tablet by mouth daily.     norethindrone (MICRONOR) 0.35 MG tablet Take 1 tablet by mouth daily.  OMEGA-3 FATTY ACIDS-VITAMIN E PO Take by mouth.     No current facility-administered medications on file prior to visit.        ROS:  All others reviewed and negative.  Objective        PE:  BP 128/72   Pulse 73   Temp 98.3 F (36.8 C) (Oral)   Ht 5\' 7"  (1.702 m)   Wt 241 lb (109.3 kg)   SpO2 97%   BMI 37.75 kg/m                 Constitutional: Pt appears in NAD               HENT: Head: NCAT.                Right Ear: External ear normal.                 Left Ear: External ear normal.                Eyes: . Pupils are equal, round, and reactive to  light. Conjunctivae and EOM are normal               Nose: without d/c or deformity               Neck: Neck supple. Gross normal ROM               Cardiovascular: Normal rate and regular rhythm.                 Pulmonary/Chest: Effort normal and breath sounds without rales or wheezing.                Abd:  Soft, NT, ND, + BS, no organomegaly               Neurological: Pt is alert. At baseline orientation, motor grossly intact               Skin: Skin is warm. No rashes, no other new lesions, LE edema - none               Psychiatric: Pt behavior is normal without agitation   Micro: none  Cardiac tracings I have personally interpreted today:  none  Pertinent Radiological findings (summarize): none   Lab Results  Component Value Date   WBC 5.4 07/03/2022   HGB 14.7 07/03/2022   HCT 44.0 07/03/2022   PLT 217.0 07/03/2022   GLUCOSE 98 07/03/2022   CHOL 196 07/03/2022   TRIG 63.0 07/03/2022   HDL 65.70 07/03/2022   LDLCALC 118 (H) 07/03/2022   ALT 15 07/03/2022   AST 20 07/03/2022   NA 138 07/03/2022   K 4.7 07/03/2022   CL 100 07/03/2022   CREATININE 0.86 07/03/2022   BUN 14 07/03/2022   CO2 28 07/03/2022   TSH 2.33 07/03/2022   HGBA1C 5.4 07/03/2022   Assessment/Plan:  Hannah Cain is a 52 y.o. White or Caucasian [1] female with  has a past medical history of ALLERGIC RHINITIS (03/17/2007), Arthritis, Basal cell carcinoma (BCC) (06/10/2017), GERD (gastroesophageal reflux disease), HLD (hyperlipidemia) (06/10/2017), Palpitations (11/12/2017), Symptomatic PVCs (06/11/2018), URI (07/19/2009), and Vitamin B 12 deficiency.  Encounter for well adult exam with abnormal findings Age and sex appropriate education and counseling updated with regular exercise and diet Referrals for preventative services - pt to call soon for pap and mammogram Immunizations addressed - for shingrix at pharmacy Smoking counseling  - none needed Evidence  for depression or other mood disorder - none  significant Most recent labs reviewed. I have personally reviewed and have noted: 1) the patient's medical and social history 2) The patient's current medications and supplements 3) The patient's height, weight, and BMI have been recorded in the chart   B12 deficiency Lab Results  Component Value Date   VITAMINB12 484 07/03/2022   Unable to continue oral replacement - b12 1000 mcg qd due to nausea, for IM replacement   HLD (hyperlipidemia) Lab Results  Component Value Date   LDLCALC 118 (H) 07/03/2022   Uncontrolled, goal LDL < 100, pt for lower chol diet, declines statin, also for Card CT score   Hypertension BP Readings from Last 3 Encounters:  07/12/22 128/72  05/01/22 122/78  12/11/21 120/80   Stable, pt to continue medical treatment  - diet, wt control  Vitamin D deficiency Last vitamin D Lab Results  Component Value Date   VD25OH 29.21 (L) 07/03/2022   Low, to start oral replacement   Left foot pain Midl to mod worsening, for refer sport medicine - ? Need cortisone  Followup: Return in about 1 year (around 07/12/2023).  Oliver BarreJames Issabelle Mcraney, MD 07/14/2022 8:32 PM [facility]

## 2022-07-14 DIAGNOSIS — M79672 Pain in left foot: Secondary | ICD-10-CM | POA: Insufficient documentation

## 2022-07-14 NOTE — Assessment & Plan Note (Signed)
BP Readings from Last 3 Encounters:  07/12/22 128/72  05/01/22 122/78  12/11/21 120/80   Stable, pt to continue medical treatment  - diet, wt control

## 2022-07-14 NOTE — Assessment & Plan Note (Signed)
Age and sex appropriate education and counseling updated with regular exercise and diet Referrals for preventative services - pt to call soon for pap and mammogram Immunizations addressed - for shingrix at pharmacy Smoking counseling  - none needed Evidence for depression or other mood disorder - none significant Most recent labs reviewed. I have personally reviewed and have noted: 1) the patient's medical and social history 2) The patient's current medications and supplements 3) The patient's height, weight, and BMI have been recorded in the chart

## 2022-07-14 NOTE — Assessment & Plan Note (Signed)
Lab Results  Component Value Date   LDLCALC 118 (H) 07/03/2022   Uncontrolled, goal LDL < 100, pt for lower chol diet, declines statin, also for Card CT score

## 2022-07-14 NOTE — Assessment & Plan Note (Signed)
Last vitamin D Lab Results  Component Value Date   VD25OH 29.21 (L) 07/03/2022   Low, to start oral replacement

## 2022-07-14 NOTE — Assessment & Plan Note (Signed)
Lab Results  Component Value Date   VITAMINB12 484 07/03/2022   Unable to continue oral replacement - b12 1000 mcg qd due to nausea, for IM replacement

## 2022-07-14 NOTE — Assessment & Plan Note (Signed)
Midl to mod worsening, for refer sport medicine - ? Need cortisone

## 2022-07-15 ENCOUNTER — Ambulatory Visit (INDEPENDENT_AMBULATORY_CARE_PROVIDER_SITE_OTHER): Payer: BC Managed Care – PPO

## 2022-07-15 ENCOUNTER — Ambulatory Visit: Payer: BC Managed Care – PPO | Admitting: Sports Medicine

## 2022-07-15 VITALS — BP 110/78 | HR 65 | Ht 67.0 in | Wt 237.0 lb

## 2022-07-15 DIAGNOSIS — R768 Other specified abnormal immunological findings in serum: Secondary | ICD-10-CM

## 2022-07-15 DIAGNOSIS — M79672 Pain in left foot: Secondary | ICD-10-CM

## 2022-07-15 DIAGNOSIS — M2550 Pain in unspecified joint: Secondary | ICD-10-CM | POA: Diagnosis not present

## 2022-07-15 LAB — COMPREHENSIVE METABOLIC PANEL
ALT: 24 U/L (ref 0–35)
AST: 27 U/L (ref 0–37)
Albumin: 4.6 g/dL (ref 3.5–5.2)
Alkaline Phosphatase: 73 U/L (ref 39–117)
BUN: 11 mg/dL (ref 6–23)
CO2: 26 mEq/L (ref 19–32)
Calcium: 9.4 mg/dL (ref 8.4–10.5)
Chloride: 103 mEq/L (ref 96–112)
Creatinine, Ser: 0.75 mg/dL (ref 0.40–1.20)
GFR: 92.4 mL/min (ref >60.00)
Glucose, Bld: 86 mg/dL (ref 70–99)
Potassium: 3.8 mEq/L (ref 3.5–5.1)
Sodium: 138 mEq/L (ref 135–145)
Total Bilirubin: 0.6 mg/dL (ref 0.2–1.2)
Total Protein: 7.7 g/dL (ref 6.0–8.3)

## 2022-07-15 LAB — CBC WITH DIFFERENTIAL/PLATELET
Basophils Absolute: 0 10*3/uL (ref 0.0–0.1)
Basophils Relative: 0.5 % (ref 0.0–3.0)
Eosinophils Absolute: 0.2 10*3/uL (ref 0.0–0.7)
Eosinophils Relative: 2.1 % (ref 0.0–5.0)
HCT: 41.3 % (ref 36.0–46.0)
Hemoglobin: 14.1 g/dL (ref 12.0–15.0)
Lymphocytes Relative: 27.6 % (ref 12.0–46.0)
Lymphs Abs: 2.1 10*3/uL (ref 0.7–4.0)
MCHC: 34 g/dL (ref 30.0–36.0)
MCV: 88.2 fl (ref 78.0–100.0)
Monocytes Absolute: 1 10*3/uL (ref 0.1–1.0)
Monocytes Relative: 13.2 % — ABNORMAL HIGH (ref 3.0–12.0)
Neutro Abs: 4.2 10*3/uL (ref 1.4–7.7)
Neutrophils Relative %: 56.6 % (ref 43.0–77.0)
Platelets: 244 10*3/uL (ref 150.0–400.0)
RBC: 4.69 Mil/uL (ref 3.87–5.11)
RDW: 13.7 % (ref 11.5–15.5)
WBC: 7.5 10*3/uL (ref 4.0–10.5)

## 2022-07-15 LAB — SEDIMENTATION RATE: Sed Rate: 30 mm/hr (ref 0–30)

## 2022-07-15 LAB — C-REACTIVE PROTEIN: CRP: <1 mg/dL (ref 0.5–20.0)

## 2022-07-15 LAB — VITAMIN D 25 HYDROXY (VIT D DEFICIENCY, FRACTURES): VITD: 29.74 ng/mL — ABNORMAL LOW (ref 30.00–100.00)

## 2022-07-15 LAB — TSH: TSH: 2.26 u[IU]/mL (ref 0.35–5.50)

## 2022-07-15 LAB — FERRITIN: Ferritin: 50.2 ng/mL (ref 10.0–291.0)

## 2022-07-15 LAB — URIC ACID: Uric Acid, Serum: 5.5 mg/dL (ref 2.4–7.0)

## 2022-07-15 MED ORDER — MELOXICAM 15 MG PO TABS: 15.0000 mg | ORAL_TABLET | Freq: Every day | ORAL | 0 refills | Status: DC

## 2022-07-15 NOTE — Progress Notes (Signed)
Hannah Cain [facility] [address] [place] [address] Phone: [phone]   Assessment and Plan:     1. Polyarthralgia 2. Left foot pain -Chronic with exacerbation, subsequent sports medicine visit - Patient presents with recurrence of multiple musculoskeletal complaints with most prominent being left foot at lateral plantar surface of calcaneus.  Unclear etiology of foot pain which is overall decreased today on physical exam per patient.  Most consistent with Achilles tendinitis based on HPI and patient's description of pain location - Start HEP for foot - Due to chronic polyarthralgia, we will evaluate for autoimmune or inflammatory causes of patient's pain.  Lab work obtained today - X-ray obtained in clinic.  My interpretation: No acute fracture or dislocation.  Bipartite sesamoid bones.  Bone spurring at calcaneus at Achilles tendon insertion and plantar fascia origin - DG Foot Complete Left; Future  Other orders - meloxicam (MOBIC) 15 MG tablet; Take 1 tablet (15 mg total) by mouth daily.    Pertinent previous records reviewed include none   Follow Up: 4 weeks for reevaluation.  If no improvement or worsening of symptoms, could consider physical therapy versus ultrasound versus advanced imaging   Subjective:   I, Hannah Cain, am serving as a Neurosurgeon for Doctor Richardean Sale  Chief Complaint: left foot pain   HPI:   07/15/22 Patient is a 52 year old female complaining of left foot pain. Patient states that all her joints are hurting, lateral heel pain that radiates through the front of her calf, intermittent pain when walking so bad that she has to sit down, antalgic gait , she will get anterior foot pain , she has doubled her vitamin D, does note some numbness through her leg not much in her foot , tylenol for the pain and advil PM , the pain will keep her up at night ,   Relevant Historical Information:  Hypertension  Additional pertinent review of systems negative.   Current Outpatient Medications:    cholecalciferol (VITAMIN D3) 25 MCG (1000 UNIT) tablet, Take 1,000 Units by mouth daily., Disp: , Rfl:    Cyanocobalamin (VITAMIN B-12 IJ), Inject as directed., Disp: , Rfl:    famotidine (PEPCID) 20 MG tablet, Take 1 tablet (20 mg total) by mouth at bedtime., Disp: 30 tablet, Rfl: 11   Fexofenadine HCl (ALLEGRA ALLERGY PO), Take by mouth., Disp: , Rfl:    FLAXSEED, LINSEED, PO, Take by mouth., Disp: , Rfl:    fluticasone (FLONASE) 50 MCG/ACT nasal spray, Place 2 sprays into both nostrils daily., Disp: , Rfl:    meloxicam (MOBIC) 15 MG tablet, Take 1 tablet (15 mg total) by mouth daily., Disp: 30 tablet, Rfl: 0   Multiple Vitamin (MULTIVITAMIN) tablet, Take 1 tablet by mouth daily., Disp: , Rfl:    norethindrone (MICRONOR) 0.35 MG tablet, Take 1 tablet by mouth daily., Disp: , Rfl:    OMEGA-3 FATTY ACIDS-VITAMIN E PO, Take by mouth., Disp: , Rfl:    Objective:     Vitals:   07/15/22 1458  BP: 110/78  Pulse: 65  SpO2: 99%  Weight: 237 lb (107.5 kg)  Height: 5\' 7"  (1.702 m)      Body mass index is 37.12 kg/m.    Physical Exam:    Gen: Appears well, nad, nontoxic and pleasant Psych: Alert and oriented, appropriate mood and affect Neuro: sensation intact, strength is 5/5 with df/pf/inv/ev, muscle tone wnl Skin: no susupicious lesions or rashes  Left foot/ankle:  No deformity, no swelling or effusion Mild TTP lateral calcaneus NTTP over fibular head, lat mal, medial mal, achilles, navicular, base of 5th, ATFL, CFL, deltoid, or midfoot ROM DF 30, PF 45, inv/ev intact Negative ant drawer, talar tilt, rotation test, squeeze test. Neg thompson No pain with resisted inversion or eversion    Electronically signed by:  Hannah Cain [facility] 4:54 PM 07/15/22

## 2022-07-15 NOTE — Patient Instructions (Addendum)
Good to see you - Start meloxicam 15 mg daily x2 weeks.  If still having pain after 2 weeks, complete 3rd-week of meloxicam. May use remaining meloxicam as needed once daily for pain control.  Do not to use additional NSAIDs while taking meloxicam.  May use Tylenol [id number] mg 2 to 3 times a day for breakthrough pain. Foot and ankle HEP  Xray's on the way out  Labs on the the way out  4 week follow up

## 2022-07-16 ENCOUNTER — Other Ambulatory Visit: Payer: Self-pay | Admitting: Obstetrics

## 2022-07-16 DIAGNOSIS — N644 Mastodynia: Secondary | ICD-10-CM

## 2022-07-17 LAB — RHEUMATOID FACTOR: Rheumatoid fact SerPl-aCnc: <14 IU/mL (ref <14)

## 2022-07-17 LAB — ANTI-NUCLEAR AB-TITER (ANA TITER): ANA Titer 1: 1:320 {titer} — ABNORMAL HIGH

## 2022-07-17 LAB — ANA: Anti Nuclear Antibody (ANA): POSITIVE — AB

## 2022-07-17 LAB — CYCLIC CITRUL PEPTIDE ANTIBODY, IGG: Cyclic Citrullin Peptide Ab: <16 UNITS

## 2022-07-18 NOTE — Progress Notes (Signed)
Patient given results and referral placed to [facility]

## 2022-07-18 NOTE — Addendum Note (Signed)
Addended by: Evon Slack on: 07/18/2022 08:13 AM   Modules accepted: Orders

## 2022-08-05 ENCOUNTER — Encounter: Payer: Self-pay | Admitting: Family Medicine

## 2022-08-05 ENCOUNTER — Ambulatory Visit: Payer: BC Managed Care – PPO | Admitting: Family Medicine

## 2022-08-05 VITALS — BP 138/78 | HR 80 | Temp 98.7°F | Resp 20 | Ht 67.0 in | Wt 237.0 lb

## 2022-08-05 DIAGNOSIS — L03113 Cellulitis of right upper limb: Secondary | ICD-10-CM | POA: Diagnosis not present

## 2022-08-05 MED ORDER — CEPHALEXIN 500 MG PO CAPS
500.0000 mg | ORAL_CAPSULE | Freq: Two times a day (BID) | ORAL | 0 refills | Status: AC
Start: 2022-08-05 — End: 2022-08-12

## 2022-08-05 NOTE — Progress Notes (Signed)
Assessment & Plan:  1. Cellulitis of right upper arm Education provided on cellulitis.  Discussed she should not pop the blister. - cephALEXin (KEFLEX) 500 MG capsule; Take 1 capsule (500 mg total) by mouth 2 (two) times daily for 7 days.  Dispense: 14 capsule; Refill: 0   Follow up plan: Return if symptoms worsen or fail to improve.  [name], MSN, APRN, FNP-C  Subjective:  HPI: Hannah Cain is a 52 y.o. female presenting on 08/05/2022 for Animal Bite (Red, warm, blister area on the back of the right upper arm. )  Patient reports her puppy bit her on the back of her upper right arm two days ago.  It is now red, warm, and has a blister.  Last Tdap 07/02/2018.  The puppy is up-to-date on all of its shots.   ROS: Negative unless specifically indicated above in HPI.   Relevant past medical history reviewed and updated as indicated.   Allergies and medications reviewed and updated.   Current Outpatient Medications:    cholecalciferol (VITAMIN D3) 25 MCG (1000 UNIT) tablet, Take 1,000 Units by mouth daily., Disp: , Rfl:    Cyanocobalamin (VITAMIN B-12 IJ), Inject as directed., Disp: , Rfl:    famotidine (PEPCID) 20 MG tablet, Take 1 tablet (20 mg total) by mouth at bedtime., Disp: 30 tablet, Rfl: 11   Fexofenadine HCl (ALLEGRA ALLERGY PO), Take by mouth., Disp: , Rfl:    meloxicam (MOBIC) 15 MG tablet, Take 1 tablet (15 mg total) by mouth daily., Disp: 30 tablet, Rfl: 0   norethindrone (MICRONOR) 0.35 MG tablet, Take 1 tablet by mouth daily., Disp: , Rfl:    FLAXSEED, LINSEED, PO, Take by mouth. (Patient not taking: Reported on 08/05/2022), Disp: , Rfl:    fluticasone (FLONASE) 50 MCG/ACT nasal spray, Place 2 sprays into both nostrils daily. (Patient not taking: Reported on 08/05/2022), Disp: , Rfl:    Multiple Vitamin (MULTIVITAMIN) tablet, Take 1 tablet by mouth daily. (Patient not taking: Reported on 08/05/2022), Disp: , Rfl:    OMEGA-3 FATTY ACIDS-VITAMIN E PO, Take by mouth.  (Patient not taking: Reported on 08/05/2022), Disp: , Rfl:   No Known Allergies  Objective:   BP 138/78   Pulse 80   Temp 98.7 F (37.1 C)   Resp 20   Ht 5\' 7"  (1.702 m)   Wt 237 lb (107.5 kg)   LMP 08/04/2021 (Approximate)   BMI 37.12 kg/m    Physical Exam Vitals reviewed.  Constitutional:      General: She is not in acute distress.    Appearance: Normal appearance. She is not ill-appearing, toxic-appearing or diaphoretic.  HENT:     Head: Normocephalic and atraumatic.  Eyes:     General: No scleral icterus.       Right eye: No discharge.        Left eye: No discharge.     Conjunctiva/sclera: Conjunctivae normal.  Cardiovascular:     Rate and Rhythm: Normal rate.  Pulmonary:     Effort: Pulmonary effort is normal. No respiratory distress.  Musculoskeletal:        General: Normal range of motion.     Cervical back: Normal range of motion.  Skin:    General: Skin is warm and dry.     Capillary Refill: Capillary refill takes less than 2 seconds.     Comments: The backside of the right upper arm has a very small bruise, a blister the size of a dime, is erythematous  and warmer to the touch than the surrounding skin.  Neurological:     General: No focal deficit present.     Mental Status: She is alert and oriented to person, place, and time. Mental status is at baseline.  Psychiatric:        Mood and Affect: Mood normal.        Behavior: Behavior normal.        Thought Content: Thought content normal.        Judgment: Judgment normal.

## 2022-08-13 ENCOUNTER — Ambulatory Visit (INDEPENDENT_AMBULATORY_CARE_PROVIDER_SITE_OTHER): Payer: BC Managed Care – PPO | Admitting: Family Medicine

## 2022-08-13 ENCOUNTER — Encounter (INDEPENDENT_AMBULATORY_CARE_PROVIDER_SITE_OTHER): Payer: Self-pay | Admitting: Family Medicine

## 2022-08-13 VITALS — BP 137/81 | HR 87 | Temp 97.7°F | Ht 67.0 in | Wt 234.0 lb

## 2022-08-13 DIAGNOSIS — R6339 Other feeding difficulties: Secondary | ICD-10-CM

## 2022-08-13 DIAGNOSIS — E669 Obesity, unspecified: Secondary | ICD-10-CM | POA: Diagnosis not present

## 2022-08-13 DIAGNOSIS — N938 Other specified abnormal uterine and vaginal bleeding: Secondary | ICD-10-CM | POA: Diagnosis not present

## 2022-08-13 DIAGNOSIS — E538 Deficiency of other specified B group vitamins: Secondary | ICD-10-CM | POA: Diagnosis not present

## 2022-08-13 DIAGNOSIS — Z6836 Body mass index (BMI) 36.0-36.9, adult: Secondary | ICD-10-CM

## 2022-08-13 DIAGNOSIS — Z0289 Encounter for other administrative examinations: Secondary | ICD-10-CM

## 2022-08-13 NOTE — Progress Notes (Unsigned)
Office: [phone]  /  Fax: [phone]   Initial Visit  Hannah Cain was seen in clinic today to evaluate for obesity. She is interested in losing weight to improve overall health and reduce the risk of weight related complications. She presents today to review program treatment options, initial physical assessment, and evaluation.     She was referred by: PCP  When asked what else they would like to accomplish? She states: Adopt healthier eating patterns, Improve energy levels and physical activity, and Improve appearance  Weight history: has gained 40 lb in the past year.  Started a birth control pill a year ago.  Her brother passed away from colon cancer.    When asked how has your weight affected you? She states: Having fatigue  Some associated conditions: polyarhtralgias, vitamin D def  Contributing factors: Disruption of circadian rhythm, Nutritional, Stress, and Reduced physical activity  Weight promoting medications identified: None  Current nutrition plan: None  Current level of physical activity: Walking  Current or previous pharmacotherapy: None  Response to medication: Never tried medications   Past medical history includes:   Past Medical History:  Diagnosis Date   ALLERGIC RHINITIS 03/17/2007   Arthritis    Basal cell carcinoma (BCC) 06/10/2017   GERD (gastroesophageal reflux disease)    HLD (hyperlipidemia) 06/10/2017   Palpitations 11/12/2017   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  Peak Weight:234 , Body Fat%:45.4, Visceral Fat Rating:12, Weight trend over the last 12 months: Increasing  General:  Alert, oriented and cooperative. Patient is in no acute distress.   Respiratory: Normal respiratory effort, no problems with respiration noted   Gait: able to ambulate independently  Mental Status: Normal mood and affect. Normal behavior. Normal judgment and thought content.   DIAGNOSTIC DATA REVIEWED:  BMET    Component Value Date/Time   NA 138 07/15/2022 1544   K 3.8 07/15/2022 1544   CL 103 07/15/2022 1544   CO2 26 07/15/2022 1544   GLUCOSE 86 07/15/2022 1544   BUN 11 07/15/2022 1544   CREATININE 0.75 07/15/2022 1544   CALCIUM 9.4 07/15/2022 1544   Lab Results  Component Value Date   HGBA1C 5.4 07/03/2022   No results found for: "INSULIN" CBC    Component Value Date/Time   WBC 7.5 07/15/2022 1544   RBC 4.69 07/15/2022 1544   HGB 14.1 07/15/2022 1544   HCT 41.3 07/15/2022 1544   PLT 244.0 07/15/2022 1544   MCV 88.2 07/15/2022 1544   MCHC 34.0 07/15/2022 1544   RDW 13.7 07/15/2022 1544   Iron/TIBC/Ferritin/ %Sat    Component Value Date/Time   IRON 60 06/25/2019 0801   FERRITIN 50.2 07/15/2022 1544   IRONPCTSAT 17.6 (L) 06/25/2019 0801   Lipid Panel     Component Value Date/Time   CHOL 196 07/03/2022 0758   TRIG 63.0 07/03/2022 0758   HDL 65.70 07/03/2022 0758   CHOLHDL 3 07/03/2022 0758   VLDL 12.6 07/03/2022 0758   LDLCALC 118 (H) 07/03/2022 0758   Hepatic Function Panel     Component Value Date/Time   PROT 7.7 07/15/2022  1544   ALBUMIN 4.6 07/15/2022 1544   AST 27 07/15/2022 1544   ALT 24 07/15/2022 1544   ALKPHOS 73 07/15/2022 1544   BILITOT 0.6 07/15/2022 1544   BILIDIR 0.2 07/03/2022 0758      Component Value Date/Time   TSH 2.26 07/15/2022 1544     Assessment and Plan:   There are no diagnoses linked to this encounter.      Obesity Treatment / Action Plan:  {[email or web address]:28314::"Patient will work on garnering support from family and friends to begin weight loss journey.","Will work on eliminating or reducing the presence of highly palatable, calorie dense foods in the home.","Will  complete provided nutritional and psychosocial assessment questionnaire before the next appointment.","Will be scheduled for indirect calorimetry to determine resting energy expenditure in a fasting state.  This will allow [name] to create a reduced calorie, high-protein meal plan to promote loss of fat mass while preserving muscle mass."}  Obesity Education Performed Today:  She was weighed on the bioimpedance scale and results were discussed and documented in the synopsis.  We discussed obesity as a disease and the importance of a more detailed evaluation of all the factors contributing to the disease.  We discussed the importance of long term lifestyle changes which include nutrition, exercise and behavioral modifications as well as the importance of customizing this to her specific health and social needs.  We discussed the benefits of reaching a healthier weight to alleviate the symptoms of existing conditions and reduce the risks of the biomechanical, metabolic and psychological effects of obesity.  Hannah Cain appears to be in the action stage of change and states they are ready to start intensive lifestyle modifications and behavioral modifications.  *** minutes was spent today on this visit including the above counseling, pre-visit chart review, and post-visit documentation.  Reviewed by clinician on day of visit: allergies, medications, problem list, medical history, surgical history, family history, social history, and previous encounter notes pertinent to obesity diagnosis.    Seymour Bars, D.O. DABFM, DABOM [facility] [phone] [place], [place] [address] [phone]     I

## 2022-08-14 DIAGNOSIS — N938 Other specified abnormal uterine and vaginal bleeding: Secondary | ICD-10-CM | POA: Insufficient documentation

## 2022-08-14 DIAGNOSIS — R6339 Other feeding difficulties: Secondary | ICD-10-CM | POA: Insufficient documentation

## 2022-08-14 NOTE — Assessment & Plan Note (Signed)
Reports failing to respond to oral B12 supplementation needing IM B12 injections monthly with PCP Tolerating this well and has seen improved energy levels Denies paresthesias Eats little red meat due to husband having alpha- gal  Continue monthly B12 injections with PCP Check level with next set of labs

## 2022-08-14 NOTE — Assessment & Plan Note (Signed)
Improved on Micronor per Obgyn Denies needing iron supplementation Denies excess fatigue  Continue current treatment per Obgyn Plan to check CBC and iron with next set of labs

## 2022-08-14 NOTE — Assessment & Plan Note (Signed)
She reports being very picky with consumption of vegetables but has seen an RD in the past and was able to add them to a blender for smoothies She does eat most meats, dairy and fruit  Will keep in mind her texture issues with her prescribed meal plan Encouraged her to keep an open mind in trying new vegetables

## 2022-08-15 NOTE — Progress Notes (Signed)
Hannah Cain [facility] [address] [place] [address] Phone: [phone]   Assessment and Plan:     1. Left foot pain -Chronic with exacerbation, subsequent visit - Overall improvement after completing course of meloxicam and HEP - Recommend discontinuing meloxicam and use remainder as needed, no more than 1-2 times per week maximum - Recommend Tylenol use for day-to-day pain relief - Recommend using cushioned inserts to decrease flares of pain - Continue HEP  2. Polyarthralgia 3. Positive ANA (antinuclear antibody)  -New diagnosis - Reviewed patient's lab work which included positive ANA with 1: 320 titer, though relatively unremarkable remaining lab work including normal levels of ferritin, CRP, sed rate - Patient establish care with rheumatology last week and had additional lab work done.  Unfortunately we cannot see their records or their lab work.  Will allow rheumatology to further dictate care  Pertinent previous records reviewed include autoimmune lab work from prior office visit   Follow Up: As needed   Subjective:   I, Hannah Cain, am serving as a Neurosurgeon for Doctor Richardean Sale   Chief Complaint: left foot pain    HPI:    07/15/22 Patient is a 52 year old female complaining of left foot pain. Patient states that all her joints are hurting, lateral heel pain that radiates through the front of her calf, intermittent pain when walking so bad that she has to sit down, antalgic gait , she will get anterior foot pain , she has doubled her vitamin D, does note some numbness through her leg not much in her foot , tylenol for the pain and advil PM , the pain will keep her up at night ,   08/16/2022 Patient states that  she is pretty good, only had two pain flares after being on her feet all day    Relevant Historical Information: Hypertension  Additional pertinent review of systems negative.   Current Outpatient  Medications:    cholecalciferol (VITAMIN D3) 25 MCG (1000 UNIT) tablet, Take 1,000 Units by mouth daily., Disp: , Rfl:    Cyanocobalamin (VITAMIN B-12 IJ), Inject as directed., Disp: , Rfl:    famotidine (PEPCID) 20 MG tablet, Take 1 tablet (20 mg total) by mouth at bedtime., Disp: 30 tablet, Rfl: 11   Fexofenadine HCl (ALLEGRA ALLERGY PO), Take by mouth., Disp: , Rfl:    FLAXSEED, LINSEED, PO, Take by mouth., Disp: , Rfl:    fluticasone (FLONASE) 50 MCG/ACT nasal spray, Place 2 sprays into both nostrils daily., Disp: , Rfl:    meloxicam (MOBIC) 15 MG tablet, Take 1 tablet (15 mg total) by mouth daily., Disp: 30 tablet, Rfl: 0   Multiple Vitamin (MULTIVITAMIN) tablet, Take 1 tablet by mouth daily., Disp: , Rfl:    norethindrone (MICRONOR) 0.35 MG tablet, Take 1 tablet by mouth daily., Disp: , Rfl:    OMEGA-3 FATTY ACIDS-VITAMIN E PO, Take by mouth., Disp: , Rfl:    Objective:     Vitals:   08/16/22 0824  BP: 122/72  Pulse: 71  SpO2: 97%  Weight: 236 lb (107 kg)  Height: 5\' 7"  (1.702 m)      Body mass index is 36.96 kg/m.    Physical Exam:    Gen: Appears well, nad, nontoxic and pleasant Psych: Alert and oriented, appropriate mood and affect Neuro: sensation intact, strength is 5/5 with df/pf/inv/ev, muscle tone wnl Skin: no susupicious lesions or rashes   Left foot/ankle:  No deformity, no swelling  or effusion NTTP lateral calcaneus NTTP over fibular head, lat mal, medial mal, achilles, navicular, base of 5th, ATFL, CFL, deltoid, or midfoot ROM DF 30, PF 45, inv/ev intact Negative ant drawer, talar tilt, rotation test, squeeze test. Neg thompson No pain with resisted inversion or eversion     Electronically signed by:  Hannah Cain [facility] 9:07 AM 08/16/22

## 2022-08-16 ENCOUNTER — Ambulatory Visit (INDEPENDENT_AMBULATORY_CARE_PROVIDER_SITE_OTHER): Payer: BC Managed Care – PPO

## 2022-08-16 ENCOUNTER — Ambulatory Visit: Payer: BC Managed Care – PPO | Admitting: Sports Medicine

## 2022-08-16 VITALS — BP 122/72 | HR 71 | Ht 67.0 in | Wt 236.0 lb

## 2022-08-16 DIAGNOSIS — E538 Deficiency of other specified B group vitamins: Secondary | ICD-10-CM

## 2022-08-16 DIAGNOSIS — M2550 Pain in unspecified joint: Secondary | ICD-10-CM | POA: Diagnosis not present

## 2022-08-16 DIAGNOSIS — M79672 Pain in left foot: Secondary | ICD-10-CM | POA: Diagnosis not present

## 2022-08-16 DIAGNOSIS — R768 Other specified abnormal immunological findings in serum: Secondary | ICD-10-CM | POA: Diagnosis not present

## 2022-08-16 MED ORDER — CYANOCOBALAMIN 1000 MCG/ML IJ SOLN
1000.0000 ug | Freq: Once | INTRAMUSCULAR | Status: AC
Start: 2022-08-16 — End: 2022-08-16
  Administered 2022-08-16: 1000 ug via INTRAMUSCULAR

## 2022-08-16 NOTE — Progress Notes (Signed)
B12 given.  Pt tolerated well. Pt is aware to give the office a call for an side effects or reactions. Please co-sign.   

## 2022-08-16 NOTE — Patient Instructions (Addendum)
Good to see you Discontinue meloxicam use remainder as needed no more than 1-2 times per week  Tylenol for day to day pain relief Recommend getting a cushioned insert for shoes As needed follow up

## 2022-08-20 ENCOUNTER — Ambulatory Visit
Admission: RE | Admit: 2022-08-20 | Discharge: 2022-08-20 | Disposition: A | Discharge status: Home / Self Care | Payer: BC Managed Care – PPO | Source: Ambulatory Visit | Attending: Obstetrics | Admitting: Obstetrics

## 2022-08-20 ENCOUNTER — Other Ambulatory Visit: Payer: Self-pay | Admitting: Obstetrics

## 2022-08-20 DIAGNOSIS — N644 Mastodynia: Secondary | ICD-10-CM

## 2022-08-23 ENCOUNTER — Ambulatory Visit (HOSPITAL_BASED_OUTPATIENT_CLINIC_OR_DEPARTMENT_OTHER)
Admission: RE | Admit: 2022-08-23 | Discharge: 2022-08-23 | Disposition: A | Discharge status: Home / Self Care | Payer: BC Managed Care – PPO | Source: Ambulatory Visit | Attending: Internal Medicine | Admitting: Internal Medicine

## 2022-08-23 DIAGNOSIS — E7800 Pure hypercholesterolemia, unspecified: Secondary | ICD-10-CM | POA: Insufficient documentation

## 2022-08-23 DIAGNOSIS — R739 Hyperglycemia, unspecified: Secondary | ICD-10-CM | POA: Insufficient documentation

## 2022-08-23 DIAGNOSIS — I10 Essential (primary) hypertension: Secondary | ICD-10-CM

## 2022-09-04 ENCOUNTER — Other Ambulatory Visit (INDEPENDENT_AMBULATORY_CARE_PROVIDER_SITE_OTHER): Payer: Self-pay | Admitting: Family Medicine

## 2022-09-04 ENCOUNTER — Encounter (INDEPENDENT_AMBULATORY_CARE_PROVIDER_SITE_OTHER): Payer: Self-pay | Admitting: Family Medicine

## 2022-09-04 ENCOUNTER — Ambulatory Visit (INDEPENDENT_AMBULATORY_CARE_PROVIDER_SITE_OTHER): Payer: BC Managed Care – PPO | Admitting: Family Medicine

## 2022-09-04 VITALS — BP 134/75 | HR 66 | Temp 98.3°F | Ht 67.0 in | Wt 228.0 lb

## 2022-09-04 DIAGNOSIS — Z6835 Body mass index (BMI) 35.0-35.9, adult: Secondary | ICD-10-CM | POA: Insufficient documentation

## 2022-09-04 DIAGNOSIS — Z1331 Encounter for screening for depression: Secondary | ICD-10-CM

## 2022-09-04 DIAGNOSIS — E538 Deficiency of other specified B group vitamins: Secondary | ICD-10-CM | POA: Diagnosis not present

## 2022-09-04 DIAGNOSIS — R002 Palpitations: Secondary | ICD-10-CM | POA: Diagnosis not present

## 2022-09-04 DIAGNOSIS — R0602 Shortness of breath: Secondary | ICD-10-CM

## 2022-09-04 DIAGNOSIS — R5383 Other fatigue: Secondary | ICD-10-CM

## 2022-09-04 DIAGNOSIS — E559 Vitamin D deficiency, unspecified: Secondary | ICD-10-CM | POA: Diagnosis not present

## 2022-09-04 DIAGNOSIS — E669 Obesity, unspecified: Secondary | ICD-10-CM

## 2022-09-04 DIAGNOSIS — Z6836 Body mass index (BMI) 36.0-36.9, adult: Secondary | ICD-10-CM | POA: Insufficient documentation

## 2022-09-04 DIAGNOSIS — Z6834 Body mass index (BMI) 34.0-34.9, adult: Secondary | ICD-10-CM | POA: Insufficient documentation

## 2022-09-04 MED ORDER — VITAMIN D (ERGOCALCIFEROL) 1.25 MG (50000 UNIT) PO CAPS
50000.0000 [IU] | ORAL_CAPSULE | ORAL | 0 refills | Status: DC
Start: 2022-09-04 — End: 2022-09-19

## 2022-09-05 LAB — LIPID PANEL
Chol/HDL Ratio: 2.9 ratio (ref 0.0–4.4)
Cholesterol, Total: 194 mg/dL (ref 100–199)
HDL: 66 mg/dL (ref >39)
LDL Chol Calc (NIH): 117 mg/dL — ABNORMAL HIGH (ref 0–99)
Triglycerides: 60 mg/dL (ref 0–149)
VLDL Cholesterol Cal: 11 mg/dL (ref 5–40)

## 2022-09-05 LAB — FOLATE: Folate: 8 ng/mL (ref >3.0)

## 2022-09-05 LAB — HEMOGLOBIN A1C
Est. average glucose Bld gHb Est-mCnc: 108 mg/dL
Hgb A1c MFr Bld: 5.4 % (ref 4.8–5.6)

## 2022-09-05 LAB — INSULIN, RANDOM: INSULIN: 10.3 u[IU]/mL (ref 2.6–24.9)

## 2022-09-05 LAB — VITAMIN B12: Vitamin B-12: 590 pg/mL (ref 232–1245)

## 2022-09-10 NOTE — Progress Notes (Signed)
Chief Complaint:   OBESITY Hannah Cain (MR# [id number]) is a 52 y.o. female who presents for evaluation and treatment of obesity and related comorbidities. Current BMI is Body mass index is 35.71 kg/m. Hannah Cain has been struggling with her weight for many years and has been unsuccessful in either losing weight, maintaining weight loss, or reaching her healthy weight goal.  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  She is married to her husband Hannah Cain.  She uses a treadmill or elliptical 30 minutes 2-3 times per week.  She is a picky eater, she does not like to cook, she is a Solicitor, and she drinks SSB's.  Aggie is currently in the action stage of change and ready to dedicate time achieving and maintaining a healthier weight. Hannah Cain is interested in becoming our patient and working on intensive lifestyle modifications including (but not limited to) diet and exercise for weight loss.  Hannah Cain's habits were reviewed today and are as follows: Her family eats meals together, she thinks her family will eat healthier with her, her desired weight loss is 78 lbs, she started gaining weight over the last year, her heaviest weight ever was 235 pounds, she is a picky eater and doesn't like to eat healthier foods, she has significant food cravings issues, she snacks frequently in the evenings, she skips meals frequently, she is frequently drinking liquids with calories, she frequently makes poor food choices, she has problems with excessive hunger, she frequently eats larger portions than normal, and she struggles with emotional eating.  Depression Screen Rand's Food and Mood (modified PHQ-9) score was 3.  Subjective:   1. Other fatigue Hannah Cain admits to daytime somnolence and admits to waking up still tired. Patient has a history of symptoms of daytime fatigue, morning fatigue, and morning headache. Hannah Cain generally gets 6 hours of sleep per night, and states that she has nightime awakenings. Snoring is  not present. Apneic episodes are not present. Epworth Sleepiness Score is 0.  EKG, WNL today.  2. SOBOE (shortness of breath on exertion) Hannah Cain notes increasing shortness of breath with exercising and seems to be worsening over time with weight gain. She notes getting out of breath sooner with activity than she used to. This has not gotten worse recently. Hannah Cain denies shortness of breath at rest or orthopnea.  3. Vitamin D deficiency Patient is taking OTC vitamin D 4000 IU daily.  She started taking it 1 to 2 years ago.  Vitamin D level 29.74 on 07/15/2022.  4. Vitamin B 12 deficiency Patient is on B12 injections monthly, started about 2 years ago.  Energy level is unchanged.  Numbness and brain fog have improved.  Patient denies paresthesias.  5. Heart palpitations Patient denies triggered by lack of sleep or stress.  Sometimes caffeine.  Not worsened by exercise.  Assessment/Plan:   1. Other fatigue Hannah Cain does feel that her weight is causing her energy to be lower than it should be. Fatigue may be related to obesity, depression or many other causes. Labs will be ordered, and in the meanwhile, [name] will focus on self care including making healthy food choices, increasing physical activity and focusing on stress reduction.  Update labs today.  - EKG 12-Lead - Lipid panel - Insulin, random - Hemoglobin A1c - Folate - Vitamin B12  2. SOBOE (shortness of breath on exertion) [name] does feel that she gets out of breath more easily that she used to when she exercises. Hannah Cain's shortness of breath appears to  be obesity related and exercise induced. She has agreed to work on weight loss and gradually increase exercise to treat her exercise induced shortness of breath. Will continue to monitor closely.   3. Vitamin D deficiency Okay OTC vitamin D.  Begin prescription vitamin D 50,000 IU weekly #5, 0 refill.  Recheck in 3 to 4 months.  Start- Vitamin D, Ergocalciferol, (DRISDOL) 1.25 MG  (50000 UNIT) CAPS capsule; Take 1 capsule (50,000 Units total) by mouth every 7 (seven) days.  Dispense: 5 capsule; Refill: 0  4. Vitamin B 12 deficiency Check B12 level today.  5. Heart palpitations Avoid use of stimulants.   6. Depression screen [name] had a negative depression screening.   7. BMI 36.0-36.9,adult  8. Obesity, with starting BMI 35.7 [name] is currently in the action stage of change and her goal is to continue with weight loss efforts. I recommend Hannah Cain begin the structured treatment plan as follows:  She has agreed to the Category 3 Plan.  100-calorie snack list given.  Try out veggies prepared in different ways.  Exercise goals: All adults should avoid inactivity. Some physical activity is better than none, and adults who participate in any amount of physical activity gain some health benefits.   Behavioral modification strategies: increasing lean protein intake, increasing vegetables, increasing water intake, decreasing liquid calories, decreasing eating out, meal planning and cooking strategies, keeping healthy foods in the home, better snacking choices, and planning for success.  She was informed of the importance of frequent follow-up visits to maximize her success with intensive lifestyle modifications for her multiple health conditions. She was informed we would discuss her lab results at her next visit unless there is a critical issue that needs to be addressed sooner. Hannah Cain agreed to keep her next visit at the agreed upon time to discuss these results.  Objective:   Blood pressure 134/75, pulse 66, temperature 98.3 F (36.8 C), height 5\' 7"  (1.702 m), weight 228 lb (103.4 kg), last menstrual period 08/04/2021, SpO2 100 %. Body mass index is 35.71 kg/m.  EKG: Normal sinus rhythm, rate 67 bpm.  Indirect Calorimeter completed today shows a VO2 of 238 and a REE of 1642.  Her calculated basal metabolic rate is 1610 thus her basal metabolic rate is worse than  expected.  General: Cooperative, alert, well developed, in no acute distress. HEENT: Conjunctivae and lids unremarkable. Cardiovascular: Regular rhythm.  Lungs: Normal work of breathing. Neurologic: No focal deficits.   Lab Results  Component Value Date   CREATININE 0.75 07/15/2022   BUN 11 07/15/2022   NA 138 07/15/2022   K 3.8 07/15/2022   CL 103 07/15/2022   CO2 26 07/15/2022   Lab Results  Component Value Date   ALT 24 07/15/2022   AST 27 07/15/2022   ALKPHOS 73 07/15/2022   BILITOT 0.6 07/15/2022   Lab Results  Component Value Date   HGBA1C 5.4 09/04/2022   HGBA1C 5.4 07/03/2022   Lab Results  Component Value Date   INSULIN 10.3 09/04/2022   Lab Results  Component Value Date   TSH 2.26 07/15/2022   Lab Results  Component Value Date   CHOL 194 09/04/2022   HDL 66 09/04/2022   LDLCALC 117 (H) 09/04/2022   TRIG 60 09/04/2022   CHOLHDL 2.9 09/04/2022   Lab Results  Component Value Date   WBC 7.5 07/15/2022   HGB 14.1 07/15/2022   HCT 41.3 07/15/2022   MCV 88.2 07/15/2022   PLT 244.0 07/15/2022   Lab  Results  Component Value Date   IRON 60 06/25/2019   FERRITIN 50.2 07/15/2022   Attestation Statements:   Reviewed by clinician on day of visit: allergies, medications, problem list, medical history, surgical history, family history, social history, and previous encounter notes.  Time spent on visit including pre-visit chart review and post-visit charting and care was 40 minutes.   I, Malcolm Metro, am acting as Energy manager for Seymour Bars, DO.  I have reviewed the above documentation for accuracy and completeness, and I agree with the above. Seymour Bars DO

## 2022-09-19 ENCOUNTER — Ambulatory Visit (INDEPENDENT_AMBULATORY_CARE_PROVIDER_SITE_OTHER): Payer: BC Managed Care – PPO | Admitting: Family Medicine

## 2022-09-19 ENCOUNTER — Encounter (INDEPENDENT_AMBULATORY_CARE_PROVIDER_SITE_OTHER): Payer: Self-pay | Admitting: Family Medicine

## 2022-09-19 VITALS — BP 123/77 | HR 73 | Temp 98.5°F | Ht 67.0 in | Wt 226.0 lb

## 2022-09-19 DIAGNOSIS — E559 Vitamin D deficiency, unspecified: Secondary | ICD-10-CM | POA: Diagnosis not present

## 2022-09-19 DIAGNOSIS — Z6835 Body mass index (BMI) 35.0-35.9, adult: Secondary | ICD-10-CM

## 2022-09-19 DIAGNOSIS — E538 Deficiency of other specified B group vitamins: Secondary | ICD-10-CM

## 2022-09-19 DIAGNOSIS — E669 Obesity, unspecified: Secondary | ICD-10-CM

## 2022-09-19 DIAGNOSIS — E7800 Pure hypercholesterolemia, unspecified: Secondary | ICD-10-CM

## 2022-09-19 MED ORDER — VITAMIN D (ERGOCALCIFEROL) 1.25 MG (50000 UNIT) PO CAPS
50000.0000 [IU] | ORAL_CAPSULE | ORAL | 0 refills | Status: DC
Start: 2022-09-19 — End: 2022-10-31

## 2022-09-19 NOTE — Assessment & Plan Note (Signed)
Last vitamin D Lab Results  Component Value Date   VD25OH 29.74 (L) 07/15/2022   She has started RX vitamin D 50,000 IU weekly without any problems. Energy level unchanged.  Continue RX vitamin D 50,0000 IU weekly. Recheck level in the next 2 mos

## 2022-09-19 NOTE — Assessment & Plan Note (Signed)
Reviewed lab from last visit. B12 level is adequate.  She is receiving B12 monthly injections with her PCP.  Energy levels are unchanged.  Denies paresthesias, memory loss.    Will cc Dr Jonny Ruiz her B12 level, continue monthly injections

## 2022-09-19 NOTE — Assessment & Plan Note (Signed)
The 10-year ASCVD risk score (Arnett DK, et al., 2019) is: 1%   Values used to calculate the score:     Age: 52 years     Sex: Female     Is Non-Hispanic African American: No     Diabetic: No     Tobacco smoker: No     Systolic Blood Pressure: 123 mmHg     Is BP treated: No     HDL Cholesterol: 66 mg/dL     Total Cholesterol: 194 mg/dL

## 2022-09-19 NOTE — Progress Notes (Signed)
Office: [phone]  /  Fax: [phone]  WEIGHT SUMMARY AND BIOMETRICS  Starting Date: 09/04/22  Starting Weight: 228 lb   Weight Lost Since Last Visit: 2 lb   Vitals Temp: 98.5 F (36.9 C) BP: 123/77 Pulse Rate: 73 SpO2: 99 %   Body Composition  Body Fat %: 43.4 % Fat Mass (lbs): 98 lbs Muscle Mass (lbs): 121.6 lbs Total Body Water (lbs): 82.6 lbs Visceral Fat Rating : 11     HPI  Chief Complaint: OBESITY  Hannah Cain is here to discuss her progress with her obesity treatment plan. She is on the the Category 3 Plan and states she is following her eating plan approximately 50 % of the time. She states she is exercising walking 30 minutes 2 times per week.   Interval History:  Since last office visit she is down 2 lb She is struggling to get in all of the food on her plan and is picky She is not a big snakcer She is trying to get in the 4 oz of meal at lunch using a low carb wrap for lunch + fruit She is cooking dinners at home most nights and husband is supportive She denies sugar cravings She plans to improve sleep over the summer working less and her daughter's dog is now gone She is in PT for R knee DJD pain and hopes to increase walking time She has an elliptical and treadmill and a total gym for home use  Pharmacotherapy: none  PHYSICAL EXAM:  Blood pressure 123/77, pulse 73, temperature 98.5 F (36.9 C), height 5\' 7"  (1.702 m), weight 226 lb (102.5 kg), SpO2 99 %. Body mass index is 35.4 kg/m.  General: She is overweight, cooperative, alert, well developed, and in no acute distress. PSYCH: Has normal mood, affect and thought process.   Lungs: Normal breathing effort, no conversational dyspnea.   ASSESSMENT AND PLAN  TREATMENT PLAN FOR OBESITY:  Recommended Dietary Goals  [name] is currently in the action stage of change. As such, her goal is to continue weight management plan. She has agreed to the Category 3 Plan. - additional breakfast  options reviewed  Behavioral Intervention  We discussed the following Behavioral Modification Strategies today: increasing lean protein intake, decreasing simple carbohydrates , increasing vegetables, increasing lower glycemic fruits, increasing water intake, work on meal planning and preparation, work on managing stress, creating time for self-care and relaxation measures, continue to practice mindfulness when eating, and planning for success.  Additional resources provided today: NA  Recommended Physical Activity Goals  [name] has been advised to work up to 150 minutes of moderate intensity aerobic activity a week and strengthening exercises 2-3 times per week for cardiovascular health, weight loss maintenance and preservation of muscle mass.   She has agreed to Increase the intensity, frequency or duration of aerobic exercises    Pharmacotherapy changes for the treatment of obesity: none  ASSOCIATED CONDITIONS ADDRESSED TODAY  Hypercholesteremia Assessment & Plan: The 10-year ASCVD risk score (Arnett DK, et al., 2019) is: 1%   Values used to calculate the score:     Age: 34 years     Sex: Female     Is Non-Hispanic African American: No     Diabetic: No     Tobacco smoker: No     Systolic Blood Pressure: 123 mmHg     Is BP treated: No     HDL Cholesterol: 66 mg/dL     Total Cholesterol: 194 mg/dL    Vitamin D  deficiency Assessment & Plan: Last vitamin D Lab Results  Component Value Date   VD25OH 29.74 (L) 07/15/2022   She has started RX vitamin D 50,000 IU weekly without any problems. Energy level unchanged.  Continue RX vitamin D 50,0000 IU weekly. Recheck level in the next 2 mos  Orders: -     Vitamin D (Ergocalciferol); Take 1 capsule (50,000 Units total) by mouth every 7 (seven) days.  Dispense: 5 capsule; Refill: 0  Obesity, Class II, BMI 35-39.9, isolated  Vitamin B 12 deficiency Assessment & Plan: Reviewed lab from last visit. B12 level is adequate.   She is receiving B12 monthly injections with her PCP.  Energy levels are unchanged.  Denies paresthesias, memory loss.    Will cc Dr Jonny Ruiz her B12 level, continue monthly injections       She was informed of the importance of frequent follow up visits to maximize her success with intensive lifestyle modifications for her multiple health conditions.   ATTESTASTION STATEMENTS:  Reviewed by clinician on day of visit: allergies, medications, problem list, medical history, surgical history, family history, social history, and previous encounter notes pertinent to obesity diagnosis.   I have personally spent 30 minutes total time today in preparation, patient care, nutritional counseling and documentation for this visit, including the following: review of clinical lab tests; review of medical tests/procedures/services.      Glennis Brink, DO DABFM, DABOM [facility] [address] [place], [place] [address] [phone]

## 2022-09-20 ENCOUNTER — Ambulatory Visit (INDEPENDENT_AMBULATORY_CARE_PROVIDER_SITE_OTHER): Payer: BC Managed Care – PPO

## 2022-09-20 DIAGNOSIS — E538 Deficiency of other specified B group vitamins: Secondary | ICD-10-CM

## 2022-09-20 MED ORDER — CYANOCOBALAMIN 1000 MCG/ML IJ SOLN
1000.0000 ug | Freq: Once | INTRAMUSCULAR | Status: AC
Start: 2022-09-20 — End: 2022-09-20
  Administered 2022-09-20: 1000 ug via INTRAMUSCULAR

## 2022-09-20 MED ORDER — CYANOCOBALAMIN 1000 MCG/ML IJ SOLN
1000.0000 ug | Freq: Once | INTRAMUSCULAR | Status: DC
Start: 2022-09-20 — End: 2022-09-20

## 2022-09-20 NOTE — Progress Notes (Signed)
B12 given.  Pt tolerated well. Pt is aware to give the office a call for an side effects or reactions. Please co-sign.   

## 2022-10-09 ENCOUNTER — Encounter (INDEPENDENT_AMBULATORY_CARE_PROVIDER_SITE_OTHER): Payer: Self-pay | Admitting: Family Medicine

## 2022-10-09 ENCOUNTER — Ambulatory Visit (INDEPENDENT_AMBULATORY_CARE_PROVIDER_SITE_OTHER): Payer: BC Managed Care – PPO | Admitting: Family Medicine

## 2022-10-09 VITALS — BP 127/79 | HR 89 | Temp 98.1°F | Ht 67.0 in | Wt 226.0 lb

## 2022-10-09 DIAGNOSIS — E559 Vitamin D deficiency, unspecified: Secondary | ICD-10-CM | POA: Diagnosis not present

## 2022-10-09 DIAGNOSIS — Z6835 Body mass index (BMI) 35.0-35.9, adult: Secondary | ICD-10-CM

## 2022-10-09 DIAGNOSIS — E7800 Pure hypercholesterolemia, unspecified: Secondary | ICD-10-CM | POA: Diagnosis not present

## 2022-10-09 DIAGNOSIS — R6339 Other feeding difficulties: Secondary | ICD-10-CM | POA: Diagnosis not present

## 2022-10-09 DIAGNOSIS — E669 Obesity, unspecified: Secondary | ICD-10-CM | POA: Diagnosis not present

## 2022-10-09 NOTE — Assessment & Plan Note (Signed)
Hannah Cain remains very picky about eating vegetables.  Hannah Cain currently consumes no vegetables.  Hannah Cain is willing to try salad.  We did discuss some low calorie salad dressings that Hannah Cain may like.  Hannah Cain is consuming 1-2 servings of fresh fruit daily.  Hannah Cain has altered her meal plan but has been gaining muscle and losing body fat indicating adequate intake of lean protein.  We have discussed the importance of getting in a variety of fruits and vegetables daily for both fiber and nutrient intake. Recommend taking a multivitamin once daily to  feel the avoid of vitamins and minerals that Hannah Cain is currently lacking.

## 2022-10-09 NOTE — Assessment & Plan Note (Signed)
Lab Results  Component Value Date   CHOL 194 09/04/2022   HDL 66 09/04/2022   LDLCALC 117 (H) 09/04/2022   TRIG 60 09/04/2022   CHOLHDL 2.9 09/04/2022   Hannah Cain has been working on a low saturated fat diet.  She has never used cholesterol-lowering medication.  She is currently on an omega-3 fish oil supplement.  Plan to recheck fasting lipid panel in the next 3 months.  Continue a low saturated fat diet with regular exercise and weight reduction.

## 2022-10-09 NOTE — Assessment & Plan Note (Signed)
Last vitamin D Lab Results  Component Value Date   VD25OH 29.74 (L) 07/15/2022   She is currently on prescription vitamin D 50,000 IU once weekly.  Energy levels are starting to improve.  She denies adverse side effects.  Recheck vitamin D level in the next 1 to 2 months.

## 2022-10-09 NOTE — Progress Notes (Signed)
Office: [phone]  /  Fax: [phone]  WEIGHT SUMMARY AND BIOMETRICS  Starting Date: 09/04/22  Starting Weight: 228lb   Weight Lost Since Last Visit: 0lb   Vitals Temp: 98.1 F (36.7 C) BP: 127/79 Pulse Rate: 89 SpO2: 98 %   Body Composition  Body Fat %: 42.9 % Fat Mass (lbs): 97.2 lbs Muscle Mass (lbs): 122.6 lbs Total Body Water (lbs): 88.8 lbs Visceral Fat Rating : 11     HPI  Chief Complaint: OBESITY  Lachlan is here to discuss her progress with her obesity treatment plan. She is on the the Category 3 Plan and states she is following her eating plan approximately 80 % of the time. She states she is exercising 45-60 minutes 3-5 times per week.   Interval History:  Since last office visit she is down 0 lb She is doing needing for pain in knee and hip on the R and this is helping She can walk without problems- able to do 2 miles 3 x a week She is down 2 lb in the past month of medically supervised weight loss She has cut out Dr Reino Kent She found a greek yogurt that she likes She is adding protein powder to [employer] milk She is willing to try salad but currently eats no veggies She is not a big snackers She just got back from a trip to the [employer]	[place] and cravings are under good control  Pharmacotherapy: none   PHYSICAL EXAM:  Blood pressure 127/79, pulse 89, temperature 98.1 F (36.7 C), height 5\' 7"  (1.702 m), weight 226 lb (102.5 kg), SpO2 98 %. Body mass index is 35.4 kg/m.  General: She is overweight, cooperative, alert, well developed, and in no acute distress. PSYCH: Has normal mood, affect and thought process.   Lungs: Normal breathing effort, no conversational dyspnea.   ASSESSMENT AND PLAN  TREATMENT PLAN FOR OBESITY:  Recommended Dietary Goals  Sharvon is currently in the action stage of change. As such, her goal is to continue weight management plan. She has agreed to the Category 3 Plan.  Behavioral Intervention  We  discussed the following Behavioral Modification Strategies today: increasing lean protein intake, decreasing simple carbohydrates , increasing vegetables, increasing lower glycemic fruits, increasing fiber rich foods, increasing water intake, work on meal planning and preparation, avoiding temptations and identifying enticing environmental cues, continue to practice mindfulness when eating, planning for success, and better snacking choices.  Additional resources provided today: NA  Recommended Physical Activity Goals  Elesia has been advised to work up to 150 minutes of moderate intensity aerobic activity a week and strengthening exercises 2-3 times per week for cardiovascular health, weight loss maintenance and preservation of muscle mass.   She has agreed to Increase the intensity, frequency or duration of aerobic exercises    Pharmacotherapy changes for the treatment of obesity: None  ASSOCIATED CONDITIONS ADDRESSED TODAY  Hypercholesteremia Assessment & Plan: Lab Results  Component Value Date   CHOL 194 09/04/2022   HDL 66 09/04/2022   LDLCALC 117 (H) 09/04/2022   TRIG 60 09/04/2022   CHOLHDL 2.9 09/04/2022   Sinea has been working on a low saturated fat diet.  She has never used cholesterol-lowering medication.  She is currently on an omega-3 fish oil supplement.  Plan to recheck fasting lipid panel in the next 3 months.  Continue a low saturated fat diet with regular exercise and weight reduction.   Obesity, with starting BMI 35.7  BMI 35.0-35.9,adult  Vitamin D deficiency Assessment &  Plan: Last vitamin D Lab Results  Component Value Date   VD25OH 29.74 (L) 07/15/2022   She is currently on prescription vitamin D 50,000 IU once weekly.  Energy levels are starting to improve.  She denies adverse side effects.  Recheck vitamin D level in the next 1 to 2 months.   Picky eater Assessment & Plan: Marylene Land remains very picky about eating vegetables.  She currently  consumes no vegetables.  She is willing to try salad.  We did discuss some low calorie salad dressings that she may like.  She is consuming 1-2 servings of fresh fruit daily.  She has altered her meal plan but has been gaining muscle and losing body fat indicating adequate intake of lean protein.  We have discussed the importance of getting in a variety of fruits and vegetables daily for both fiber and nutrient intake. Recommend taking a multivitamin once daily to  feel the avoid of vitamins and minerals that she is currently lacking.       She was informed of the importance of frequent follow up visits to maximize her success with intensive lifestyle modifications for her multiple health conditions.   ATTESTASTION STATEMENTS:  Reviewed by clinician on day of visit: allergies, medications, problem list, medical history, surgical history, family history, social history, and previous encounter notes pertinent to obesity diagnosis.   I have personally spent 30 minutes total time today in preparation, patient care, nutritional counseling and documentation for this visit, including the following: review of clinical lab tests; review of medical tests/procedures/services.      Glennis Brink, DO DABFM, DABOM [facility] [address] [place], [place] [address] [phone]

## 2022-10-15 ENCOUNTER — Ambulatory Visit (INDEPENDENT_AMBULATORY_CARE_PROVIDER_SITE_OTHER): Payer: BC Managed Care – PPO | Admitting: Family Medicine

## 2022-10-25 ENCOUNTER — Ambulatory Visit (INDEPENDENT_AMBULATORY_CARE_PROVIDER_SITE_OTHER): Payer: BC Managed Care – PPO

## 2022-10-25 DIAGNOSIS — E538 Deficiency of other specified B group vitamins: Secondary | ICD-10-CM

## 2022-10-25 DIAGNOSIS — M1990 Unspecified osteoarthritis, unspecified site: Secondary | ICD-10-CM | POA: Insufficient documentation

## 2022-10-25 DIAGNOSIS — H04123 Dry eye syndrome of bilateral lacrimal glands: Secondary | ICD-10-CM | POA: Insufficient documentation

## 2022-10-25 MED ORDER — CYANOCOBALAMIN 1000 MCG/ML IJ SOLN
1000.0000 ug | Freq: Once | INTRAMUSCULAR | Status: AC
Start: 2022-10-25 — End: 2022-10-25
  Administered 2022-10-25: 1000 ug via INTRAMUSCULAR

## 2022-10-25 NOTE — Progress Notes (Signed)
Pt was given B12 w/o any complications. 

## 2022-10-31 ENCOUNTER — Encounter (INDEPENDENT_AMBULATORY_CARE_PROVIDER_SITE_OTHER): Payer: Self-pay | Admitting: Family Medicine

## 2022-10-31 ENCOUNTER — Ambulatory Visit (INDEPENDENT_AMBULATORY_CARE_PROVIDER_SITE_OTHER): Payer: BC Managed Care – PPO | Admitting: Family Medicine

## 2022-10-31 VITALS — BP 128/84 | HR 75 | Temp 98.7°F | Ht 67.0 in | Wt 222.0 lb

## 2022-10-31 DIAGNOSIS — M222X1 Patellofemoral disorders, right knee: Secondary | ICD-10-CM | POA: Diagnosis not present

## 2022-10-31 DIAGNOSIS — E559 Vitamin D deficiency, unspecified: Secondary | ICD-10-CM

## 2022-10-31 DIAGNOSIS — E669 Obesity, unspecified: Secondary | ICD-10-CM

## 2022-10-31 DIAGNOSIS — Z6834 Body mass index (BMI) 34.0-34.9, adult: Secondary | ICD-10-CM

## 2022-10-31 DIAGNOSIS — E7800 Pure hypercholesterolemia, unspecified: Secondary | ICD-10-CM

## 2022-10-31 MED ORDER — VITAMIN D (ERGOCALCIFEROL) 1.25 MG (50000 UNIT) PO CAPS
50000.0000 [IU] | ORAL_CAPSULE | ORAL | 0 refills | Status: DC
Start: 2022-10-31 — End: 2022-11-26

## 2022-10-31 NOTE — Assessment & Plan Note (Signed)
Lab Results  Component Value Date   CHOL 194 09/04/2022   HDL 66 09/04/2022   LDLCALC 117 (H) 09/04/2022   TRIG 60 09/04/2022   CHOLHDL 2.9 09/04/2022   Patient is actively working on a reduced calorie low saturated fat diet and regular exercise.  She is currently not on any lipid-lowering medication.  Plan to recheck fasting lipid panel in November

## 2022-10-31 NOTE — Assessment & Plan Note (Signed)
Last vitamin D Lab Results  Component Value Date   VD25OH 29.74 (L) 07/15/2022   Taking vitamin D 50,000 IU once weekly.  Energy level is starting to improve.  Denies adverse side effects.  Recheck vitamin D level next visit

## 2022-10-31 NOTE — Assessment & Plan Note (Signed)
Pain has improved.  She is finishing up with physical therapy and doing home exercises.  She has been able to increase her walking time to 30 to 45 minutes 4 to 5 days a week without discomfort.  Continue home PT exercises and finish out formal physical therapy.  Continue active plan for weight reduction and muscle strengthening.

## 2022-10-31 NOTE — Progress Notes (Signed)
Office: [phone]  /  Fax: [phone]  WEIGHT SUMMARY AND BIOMETRICS  Starting Date: 09/04/22  Starting Weight: 228lb   Weight Lost Since Last Visit: 4lb   Vitals Temp: 98.7 F (37.1 C) BP: 128/84 Pulse Rate: 75 SpO2: 100 %   Body Composition  Body Fat %: 40.9 % Fat Mass (lbs): 90.8 lbs Muscle Mass (lbs): 124.6 lbs Total Body Water (lbs): 83.2 lbs Visceral Fat Rating : 11     HPI  Chief Complaint: OBESITY  Lan is here to discuss her progress with her obesity treatment plan. She is on the the Category 3 Plan and states she is following her eating plan approximately 80 % of the time. She states she is exercising 45-60 minutes 4-5 times per week.   Interval History:  Since last office visit she is down 4 lb This gives her a net weight loss of 4 lb in the past month of medically supervised weight management She has been walking 2-3 miles 4-5 x a week on the treadmill She was sick last week, eating comfort foods She is still in PT for R hip and knee pain-- pain has improved She has started doing home exercises She is still not eating ANY vegetables She is getting in all of her protein  She is eating the fruit on her plan She is traveling this weekend  Pharmacotherapy: none  PHYSICAL EXAM:  Blood pressure 128/84, pulse 75, temperature 98.7 F (37.1 C), height 5\' 7"  (1.702 m), weight 222 lb (100.7 kg), SpO2 100%. Body mass index is 34.77 kg/m.  General: She is overweight, cooperative, alert, well developed, and in no acute distress. PSYCH: Has normal mood, affect and thought process.   Lungs: Normal breathing effort, no conversational dyspnea.   ASSESSMENT AND PLAN  TREATMENT PLAN FOR OBESITY:  Recommended Dietary Goals  Lauriann is currently in the action stage of change. As such, her goal is to continue weight management plan. She has agreed to the Category 3 Plan. -Handout provided for alternatives to 2 ounces of meat  Behavioral  Intervention  We discussed the following Behavioral Modification Strategies today: increasing lean protein intake, decreasing simple carbohydrates , increasing vegetables, increasing lower glycemic fruits, increasing fiber rich foods, increasing water intake, avoiding temptations and identifying enticing environmental cues, continue to practice mindfulness when eating, planning for success, and better snacking choices.  Additional resources provided today: NA  Recommended Physical Activity Goals  Sharone has been advised to work up to 150 minutes of moderate intensity aerobic activity a week and strengthening exercises 2-3 times per week for cardiovascular health, weight loss maintenance and preservation of muscle mass.   She has agreed to Increase the intensity, frequency or duration of strengthening exercises   Pharmacotherapy changes for the treatment of obesity: None  ASSOCIATED CONDITIONS ADDRESSED TODAY  Vitamin D deficiency Assessment & Plan: Last vitamin D Lab Results  Component Value Date   VD25OH 29.74 (L) 07/15/2022   Taking vitamin D 50,000 IU once weekly.  Energy level is starting to improve.  Denies adverse side effects.  Recheck vitamin D level next visit  Orders: -     Vitamin D (Ergocalciferol); Take 1 capsule (50,000 Units total) by mouth every 7 (seven) days.  Dispense: 5 capsule; Refill: 0  Obesity, with starting BMI 35.7  BMI 34.0-34.9,adult  Hypercholesteremia Assessment & Plan: Lab Results  Component Value Date   CHOL 194 09/04/2022   HDL 66 09/04/2022   LDLCALC 117 (H) 09/04/2022   TRIG 60  09/04/2022   CHOLHDL 2.9 09/04/2022   Patient is actively working on a reduced calorie low saturated fat diet and regular exercise.  She is currently not on any lipid-lowering medication.  Plan to recheck fasting lipid panel in November   Patellofemoral syndrome of right knee Assessment & Plan: Pain has improved.  She is finishing up with physical therapy  and doing home exercises.  She has been able to increase her walking time to 30 to 45 minutes 4 to 5 days a week without discomfort.  Continue home PT exercises and finish out formal physical therapy.  Continue active plan for weight reduction and muscle strengthening.       She was informed of the importance of frequent follow up visits to maximize her success with intensive lifestyle modifications for her multiple health conditions.   ATTESTASTION STATEMENTS:  Reviewed by clinician on day of visit: allergies, medications, problem list, medical history, surgical history, family history, social history, and previous encounter notes pertinent to obesity diagnosis.   I have personally spent 30 minutes total time today in preparation, patient care, nutritional counseling and documentation for this visit, including the following: review of clinical lab tests; review of medical tests/procedures/services.      Glennis Brink, DO DABFM, DABOM [facility] [address] [place], [place] [address] [phone]

## 2022-11-06 ENCOUNTER — Encounter (INDEPENDENT_AMBULATORY_CARE_PROVIDER_SITE_OTHER): Payer: Self-pay

## 2022-11-21 ENCOUNTER — Ambulatory Visit (INDEPENDENT_AMBULATORY_CARE_PROVIDER_SITE_OTHER): Payer: BC Managed Care – PPO | Admitting: Physician Assistant

## 2022-11-25 NOTE — Progress Notes (Unsigned)
.smr  Office: [phone]  /  Fax: [phone]  WEIGHT SUMMARY AND BIOMETRICS  Vitals Temp: 98.7 F (37.1 C) BP: 131/84 Pulse Rate: 84 SpO2: 99 %   Anthropometric Measurements Height: 5\' 7"  (1.702 m) Weight: 222 lb (100.7 kg) BMI (Calculated): 34.76 Weight at Last Visit: 222lb Weight Lost Since Last Visit: 0lb Weight Gained Since Last Visit: 0lb Starting Weight: 228lb Total Weight Loss (lbs): 6 lb (2.722 kg) Peak Weight: 234lb   Body Composition  Body Fat %: 42.9 % Fat Mass (lbs): 95.2 lbs Muscle Mass (lbs): 120.4 lbs Total Body Water (lbs): 83 lbs Visceral Fat Rating : 11   Other Clinical Data RMR: 1642 Fasting: no Labs: no Today's Visit #: 5 Starting Date: 09/04/22    HPI  Chief Complaint: OBESITY  Hannah Cain is here to discuss her progress with her obesity treatment plan. She is on the the Category 3 Plan and states she is following her eating plan approximately 60 % of the time. She states she is exercising treadmill 2 miles 30-45  minutes 5-7 times per week.   Interval History:  Since last office visit she maintained her weight Did some traveling over the past couple of weeks.  Hunger/appetite- Not hungry/ Has difficulty meeting protein goals.Too much food Cravings- denies  Stress- Manageable. 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    TREATMENT PLAN FOR OBESITY: Down 6 lbs overall  TBW loss 2.63% since 09/04/22  Recommended Dietary Goals  Hannah Cain is currently in the action stage of change. As such, her goal is to continue weight management plan. She has agreed to the Category 3 Plan.  Behavioral Intervention  We discussed the following Behavioral Modification Strategies today: increasing lean protein intake, decreasing simple carbohydrates , increasing vegetables, increasing lower  glycemic fruits, increasing fiber rich foods, increasing water intake, continue to practice mindfulness when eating, and planning for success.  Additional resources provided today: NA  Recommended Physical Activity Goals  Hannah Cain has been advised to work up to 150 minutes of moderate intensity aerobic activity a week and strengthening exercises 2-3 times per week for cardiovascular health, weight loss maintenance and preservation of muscle mass.   She has agreed to Continue current level of physical activity    Pharmacotherapy We discussed various medication options to help Hannah Cain with her weight loss efforts and we both agreed to continue to work on nutritional and behavioral strategies to promote weight loss.     Return in about 4 weeks (around 12/24/2022).Marland Kitchen She was informed of the importance of frequent follow up visits to maximize her success with intensive lifestyle modifications for her multiple health conditions.  PHYSICAL EXAM:  Blood pressure 131/84, pulse 84, temperature 98.7 F (37.1 C), height 5\' 7"  (1.702 m), weight 222 lb (100.7 kg), SpO2 99%. Body mass index is 34.77 kg/m.  General: She is overweight, cooperative, alert, well developed, and in no acute distress. PSYCH: Has normal mood, affect and thought process.   Cardiovascular: HR 80's BP 131/84 Lungs: Normal breathing effort, no conversational dyspnea. Neuro: no focal deficits  DIAGNOSTIC DATA REVIEWED:  BMET    Component Value Date/Time   NA 138 07/15/2022 1544   K 3.8 07/15/2022 1544   CL 103 07/15/2022 1544   CO2 26 07/15/2022 1544   GLUCOSE 86 07/15/2022 1544   BUN 11 07/15/2022 1544   CREATININE 0.75  07/15/2022 1544   CALCIUM 9.4 07/15/2022 1544   Lab Results  Component Value Date   HGBA1C 5.4 09/04/2022   HGBA1C 5.4 07/03/2022   Lab Results  Component Value Date   INSULIN 10.3 09/04/2022   Lab Results  Component Value Date   TSH 2.26 07/15/2022   CBC    Component Value Date/Time   WBC  7.5 07/15/2022 1544   RBC 4.69 07/15/2022 1544   HGB 14.1 07/15/2022 1544   HCT 41.3 07/15/2022 1544   PLT 244.0 07/15/2022 1544   MCV 88.2 07/15/2022 1544   MCHC 34.0 07/15/2022 1544   RDW 13.7 07/15/2022 1544   Iron Studies    Component Value Date/Time   IRON 60 06/25/2019 0801   FERRITIN 50.2 07/15/2022 1544   IRONPCTSAT 17.6 (L) 06/25/2019 0801   Lipid Panel     Component Value Date/Time   CHOL 194 09/04/2022 1506   TRIG 60 09/04/2022 1506   HDL 66 09/04/2022 1506   CHOLHDL 2.9 09/04/2022 1506   CHOLHDL 3 07/03/2022 0758   VLDL 12.6 07/03/2022 0758   LDLCALC 117 (H) 09/04/2022 1506   Hepatic Function Panel     Component Value Date/Time   PROT 7.7 07/15/2022 1544   ALBUMIN 4.6 07/15/2022 1544   AST 27 07/15/2022 1544   ALT 24 07/15/2022 1544   ALKPHOS 73 07/15/2022 1544   BILITOT 0.6 07/15/2022 1544   BILIDIR 0.2 07/03/2022 0758      Component Value Date/Time   TSH 2.26 07/15/2022 1544   Nutritional Lab Results  Component Value Date   VD25OH 29.74 (L) 07/15/2022   VD25OH 29.21 (L) 07/03/2022   VD25OH 36.85 07/10/2021    ASSOCIATED CONDITIONS ADDRESSED TODAY  ASSESSMENT AND PLAN  Problem List Items Addressed This Visit     Hypercholesteremia   Vitamin D deficiency - Primary   Relevant Medications   Vitamin D, Ergocalciferol, (DRISDOL) 1.25 MG (50000 UNIT) CAPS capsule   Generalized obesity with inital BMI 36   Vitamin B 12 deficiency   BMI 34.0-34.9,adult   Vitamin D Deficiency Vitamin D is not at goal of 50.  Most recent vitamin D level was 29.74. She is on  prescription ergocalciferol 50,000 IU weekly. Lab Results  Component Value Date   VD25OH 29.74 (L) 07/15/2022   VD25OH 29.21 (L) 07/03/2022   VD25OH 36.85 07/10/2021    Plan: Continue and refill  prescription ergocalciferol 50,000 IU weekly Low vitamin D levels can be associated with adiposity and may result in leptin resistance and weight gain. Also associated with fatigue. Currently  on vitamin D supplementation without any adverse effects.  Recheck vitamin D level 3-4 times yearly to optimize supplementation/avoid over supplementation,  Hyperlipidemia LDL is not at goal. Medication(s): None Cardiovascular risk factors: dyslipidemia and obesity (BMI >= 30 kg/m2)  Lab Results  Component Value Date   CHOL 194 09/04/2022   HDL 66 09/04/2022   LDLCALC 117 (H) 09/04/2022   TRIG 60 09/04/2022   CHOLHDL 2.9 09/04/2022   CHOLHDL 3 07/03/2022   CHOLHDL 3 07/10/2021   Lab Results  Component Value Date   ALT 24 07/15/2022   AST 27 07/15/2022   ALKPHOS 73 07/15/2022   BILITOT 0.6 07/15/2022   The 10-year ASCVD risk score (Arnett DK, et al., 2019) is: 1.1%   Values used to calculate the score:     Age: 52 years     Sex: Female     Is Non-Hispanic African American: No  Diabetic: No     Tobacco smoker: No     Systolic Blood Pressure: 131 mmHg     Is BP treated: No     HDL Cholesterol: 66 mg/dL     Total Cholesterol: 194 mg/dL  Plan: Continue to work on nutrition plan -decreasing simple carbohydrates, increasing lean proteins, decreasing saturated fats and cholesterol , avoiding trans fats and exercise as able to promote weight loss, improve lipids and decrease cardiovascular risks.  Constipation Sumana notes constipation with eating so much protein/meat products now.  She has been taking no medications for constipation. Constipation is moderately controlled.  Reports does not do well with fiber or other supplements.   Plan: Increase fiber and water. Add MiraLAX every 2- 3 days and monitor results.  Consider probiotics in form of drinkable yogurt like P[employer] products to add both probiotic and is good protein source as well.    Low B 12 : On B 12 supplement for low B 12  previously.  Last level 590 much improved and on B 12 rich nutrition plan.  Plan ; Continue nutrition plan and B 12 supplementation.   ATTESTASTION STATEMENTS:  Reviewed by clinician  on day of visit: allergies, medications, problem list, medical history, surgical history, family history, social history, and previous encounter notes.   I have personally spent 43 minutes total time today in preparation, patient care, nutritional counseling and documentation for this visit, including the following: review of clinical lab tests; review of medical tests/procedures/services.      Garry Nicolini, PA-C

## 2022-11-26 ENCOUNTER — Ambulatory Visit (INDEPENDENT_AMBULATORY_CARE_PROVIDER_SITE_OTHER): Payer: BC Managed Care – PPO | Admitting: Physician Assistant

## 2022-11-26 ENCOUNTER — Encounter (INDEPENDENT_AMBULATORY_CARE_PROVIDER_SITE_OTHER): Payer: Self-pay | Admitting: Physician Assistant

## 2022-11-26 VITALS — BP 131/84 | HR 84 | Temp 98.7°F | Ht 67.0 in | Wt 222.0 lb

## 2022-11-26 DIAGNOSIS — E559 Vitamin D deficiency, unspecified: Secondary | ICD-10-CM | POA: Diagnosis not present

## 2022-11-26 DIAGNOSIS — Z6834 Body mass index (BMI) 34.0-34.9, adult: Secondary | ICD-10-CM

## 2022-11-26 DIAGNOSIS — E785 Hyperlipidemia, unspecified: Secondary | ICD-10-CM

## 2022-11-26 DIAGNOSIS — E7800 Pure hypercholesterolemia, unspecified: Secondary | ICD-10-CM | POA: Diagnosis not present

## 2022-11-26 DIAGNOSIS — K5900 Constipation, unspecified: Secondary | ICD-10-CM

## 2022-11-26 DIAGNOSIS — E538 Deficiency of other specified B group vitamins: Secondary | ICD-10-CM

## 2022-11-26 DIAGNOSIS — E669 Obesity, unspecified: Secondary | ICD-10-CM

## 2022-11-26 DIAGNOSIS — K5901 Slow transit constipation: Secondary | ICD-10-CM

## 2022-11-26 MED ORDER — VITAMIN D (ERGOCALCIFEROL) 1.25 MG (50000 UNIT) PO CAPS
50000.0000 [IU] | ORAL_CAPSULE | ORAL | 0 refills | Status: DC
Start: 2022-11-26 — End: 2023-01-21

## 2022-11-28 ENCOUNTER — Ambulatory Visit (INDEPENDENT_AMBULATORY_CARE_PROVIDER_SITE_OTHER): Payer: BC Managed Care – PPO | Admitting: *Deleted

## 2022-11-28 DIAGNOSIS — E538 Deficiency of other specified B group vitamins: Secondary | ICD-10-CM | POA: Diagnosis not present

## 2022-11-28 MED ORDER — CYANOCOBALAMIN 1000 MCG/ML IJ SOLN
1000.0000 ug | Freq: Once | INTRAMUSCULAR | Status: AC
Start: 2022-11-28 — End: 2022-11-28
  Administered 2022-11-28: 1000 ug via INTRAMUSCULAR

## 2022-11-28 NOTE — Progress Notes (Signed)
Pls cosign for B12 inj../lmb  

## 2022-12-26 ENCOUNTER — Ambulatory Visit (INDEPENDENT_AMBULATORY_CARE_PROVIDER_SITE_OTHER): Payer: BC Managed Care – PPO | Admitting: Physician Assistant

## 2022-12-26 ENCOUNTER — Encounter (INDEPENDENT_AMBULATORY_CARE_PROVIDER_SITE_OTHER): Payer: Self-pay | Admitting: Physician Assistant

## 2022-12-26 VITALS — BP 119/79 | HR 75 | Temp 98.2°F | Ht 67.0 in | Wt 220.0 lb

## 2022-12-26 DIAGNOSIS — R519 Headache, unspecified: Secondary | ICD-10-CM

## 2022-12-26 DIAGNOSIS — E559 Vitamin D deficiency, unspecified: Secondary | ICD-10-CM

## 2022-12-26 DIAGNOSIS — Z6834 Body mass index (BMI) 34.0-34.9, adult: Secondary | ICD-10-CM

## 2022-12-26 DIAGNOSIS — E669 Obesity, unspecified: Secondary | ICD-10-CM

## 2022-12-26 DIAGNOSIS — E7800 Pure hypercholesterolemia, unspecified: Secondary | ICD-10-CM

## 2022-12-26 DIAGNOSIS — K5901 Slow transit constipation: Secondary | ICD-10-CM

## 2022-12-26 DIAGNOSIS — E538 Deficiency of other specified B group vitamins: Secondary | ICD-10-CM

## 2022-12-26 NOTE — Progress Notes (Signed)
.smr  Office: [phone]  /  Fax: [phone]  WEIGHT SUMMARY AND BIOMETRICS  Vitals Temp: 98.2 F (36.8 C) BP: 119/79 Pulse Rate: 75 SpO2: 99 %   Anthropometric Measurements Height: 5\' 7"  (1.702 m) Weight: 220 lb (99.8 kg) BMI (Calculated): 34.45 Weight at Last Visit: 222 lb Weight Lost Since Last Visit: 2 lb Weight Gained Since Last Visit: 0 Starting Weight: 228 lb Total Weight Loss (lbs): 8 lb (3.629 kg) Peak Weight: 234 lb   Body Composition  Body Fat %: 42.3 % Fat Mass (lbs): 93.4 lbs Muscle Mass (lbs): 121 lbs Total Body Water (lbs): 82.4 lbs Visceral Fat Rating : 11   Other Clinical Data RMR: 1642 Fasting: yes Labs: no Today's Visit #: 6 Starting Date: 09/04/22     HPI  Chief Complaint: OBESITY  Hannah Cain is here to discuss her progress with her obesity treatment plan. She is on the the Category 3 Plan and states she is following her eating plan approximately 75 % of the time. She states she is exercising walking/treadmill 2 miles/Weights 20-30 minutes 5/3 times per week. Discussed the use of AI scribe software for clinical note transcription with the patient, who gave verbal consent to proceed.  History of Present Illness  / Interval History:  Since last office visit she is down 2 lbs Down 8 lbs overall TBW loss of 3.5% since 09/04/22  Hannah Cain is being seen for follow up for obesity treatment.  She has continued to do well with weight loss and is down 2 lbs since the last visit.   However, she experienced a fall shortly after her last visit, injuring her ankle and knee. This injury limited her ability to exercise for about two weeks, during which she did some weight lifting with her arms.  She has been experiencing headaches recently, which she has been managing by drinking regular Dr. Reino Kent. She had previously cut out Dr. Reino Kent from her diet, but reintroduced it to help with her headaches. She also reports having cravings for sweets, but has  been managing these cravings by chewing on mints. Despite her knee injury, she has been able to maintain her muscle mass and continue losing adipose tissue by focusing on her protein intake. She has been making smoothies with vegetables and adding protein powder to her milk. She anticipates challenges in the coming month due to her daughter's white coat ceremony, which will involve travel and eating out. We discussed celebration eating strategies.   Pharmacotherapy: None for weight loss  TREATMENT PLAN FOR OBESITY: Obesity Continued weight loss with a decrease of 2 pounds over the past several weeks despite a period of reduced exercise due to a knee injury. The patient has been incorporating upper body weights into her routine and has increased the incline on her treadmill to increase her heart rate during exercise. -Continue current exercise and dietary regimen. -Encourage continued focus on protein intake. Recommended Dietary Goals  Hannah Cain is currently in the action stage of change. As such, her goal is to continue weight management plan. She has agreed to the Category 3 Plan.  Behavioral Intervention  We discussed the following Behavioral Modification Strategies today: increasing lean protein intake, decreasing simple carbohydrates , increasing vegetables, increasing lower glycemic fruits, increasing water intake, identifying sources and decreasing liquid calories, decreasing eating out or consumption of processed foods, and making healthy choices when eating convenient foods, emotional eating strategies and understanding the difference between hunger signals and cravings, work on managing stress, creating time for  self-care and relaxation measures, continue to practice mindfulness when eating, planning for success, and celebration eating strategies.  Additional resources provided today: NA  Recommended Physical Activity Goals  [name] has been advised to work up to 150 minutes of moderate  intensity aerobic activity a week and strengthening exercises 2-3 times per week for cardiovascular health, weight loss maintenance and preservation of muscle mass.   She has agreed to  resume full exercise to tolerance following knee injury.    Pharmacotherapy We discussed various medication options to help [name] with her weight loss efforts and we both agreed to continue to work on nutritional and behavioral strategies to promote weight loss.     Return in about 4 weeks (around 01/23/2023).Marland Kitchen She was informed of the importance of frequent follow up visits to maximize her success with intensive lifestyle modifications for her multiple health conditions.  PHYSICAL EXAM:  Blood pressure 119/79, pulse 75, temperature 98.2 F (36.8 C), height 5\' 7"  (1.702 m), weight 220 lb (99.8 kg), SpO2 99%. Body mass index is 34.46 kg/m.  General: She is overweight, cooperative, alert, well developed, and in no acute distress. PSYCH: Has normal mood, affect and thought process.   Cardiovascular: HR 70's BP 119/79 Lungs: Normal breathing effort, no conversational dyspnea. Neuro: no focal deficits  DIAGNOSTIC DATA REVIEWED:  BMET    Component Value Date/Time   NA 138 07/15/2022 1544   K 3.8 07/15/2022 1544   CL 103 07/15/2022 1544   CO2 26 07/15/2022 1544   GLUCOSE 86 07/15/2022 1544   BUN 11 07/15/2022 1544   CREATININE 0.75 07/15/2022 1544   CALCIUM 9.4 07/15/2022 1544   Lab Results  Component Value Date   HGBA1C 5.4 09/04/2022   HGBA1C 5.4 07/03/2022   Lab Results  Component Value Date   INSULIN 10.3 09/04/2022   Lab Results  Component Value Date   TSH 2.26 07/15/2022   CBC    Component Value Date/Time   WBC 7.5 07/15/2022 1544   RBC 4.69 07/15/2022 1544   HGB 14.1 07/15/2022 1544   HCT 41.3 07/15/2022 1544   PLT 244.0 07/15/2022 1544   MCV 88.2 07/15/2022 1544   MCHC 34.0 07/15/2022 1544   RDW 13.7 07/15/2022 1544   Iron Studies    Component Value Date/Time   IRON  60 06/25/2019 0801   FERRITIN 50.2 07/15/2022 1544   IRONPCTSAT 17.6 (L) 06/25/2019 0801   Lipid Panel     Component Value Date/Time   CHOL 194 09/04/2022 1506   TRIG 60 09/04/2022 1506   HDL 66 09/04/2022 1506   CHOLHDL 2.9 09/04/2022 1506   CHOLHDL 3 07/03/2022 0758   VLDL 12.6 07/03/2022 0758   LDLCALC 117 (H) 09/04/2022 1506   Hepatic Function Panel     Component Value Date/Time   PROT 7.7 07/15/2022 1544   ALBUMIN 4.6 07/15/2022 1544   AST 27 07/15/2022 1544   ALT 24 07/15/2022 1544   ALKPHOS 73 07/15/2022 1544   BILITOT 0.6 07/15/2022 1544   BILIDIR 0.2 07/03/2022 0758      Component Value Date/Time   TSH 2.26 07/15/2022 1544   Nutritional Lab Results  Component Value Date   VD25OH 29.74 (L) 07/15/2022   VD25OH 29.21 (L) 07/03/2022   VD25OH 36.85 07/10/2021    ASSOCIATED CONDITIONS ADDRESSED TODAY  ASSESSMENT AND PLAN  Problem List Items Addressed This Visit     Vitamin D deficiency - Primary   Generalized obesity with inital BMI 36   BMI 34.0-34.9,adult  Other Visit Diagnoses     Nonintractable episodic headache, unspecified headache type          Headaches New onset, possibly related to weather changes or caffeine withdrawal. The patient has been self-medicating with regular Dr. Reino Kent for presumed caffeine withdrawal headaches. -Try Excedrin for headaches as it contains caffeine, Tylenol, and aspirin. -Consider reducing intake of regular Dr. Reino Kent and monitor for changes in headache frequency and intensity. See PCP if headaches persist.   Vitamin D deficiency The patient is currently taking prescribed Ergocalciferol 50,000 units weekly. No N/V or muscle weakness or other side effects with Ergo.  -Continue current Ergocalciferol 50,000 units weekly. No refill needed today.  Follow up vitamin D level over next 1-2 months.     Follow-up appointment scheduled for January 21, 2023.  ATTESTASTION STATEMENTS:  Reviewed by clinician on day of  visit: allergies, medications, problem list, medical history, surgical history, family history, social history, and previous encounter notes.   I have personally spent 35 minutes total time today in preparation, patient care, nutritional counseling and documentation for this visit, including the following: review of clinical lab tests; review of medical tests/procedures/services.      Carrigan Delafuente, PA-C

## 2022-12-30 ENCOUNTER — Ambulatory Visit (INDEPENDENT_AMBULATORY_CARE_PROVIDER_SITE_OTHER): Payer: BC Managed Care – PPO

## 2022-12-30 DIAGNOSIS — E538 Deficiency of other specified B group vitamins: Secondary | ICD-10-CM

## 2022-12-30 MED ORDER — CYANOCOBALAMIN 1000 MCG/ML IJ SOLN
1000.0000 ug | Freq: Once | INTRAMUSCULAR | Status: AC
Start: 2022-12-30 — End: 2022-12-30
  Administered 2022-12-30: 1000 ug via INTRAMUSCULAR

## 2022-12-30 NOTE — Progress Notes (Signed)
Pt here for monthly B12 injection per Dr. Jonny Ruiz  B12 given IM and pt tolerated injection well.  Next B12 injection scheduled for 10/23  Patient was advised to report to the office immediately if she noticed any adverse reaction.

## 2023-01-21 ENCOUNTER — Ambulatory Visit (INDEPENDENT_AMBULATORY_CARE_PROVIDER_SITE_OTHER): Payer: BC Managed Care – PPO | Admitting: Physician Assistant

## 2023-01-21 ENCOUNTER — Encounter (INDEPENDENT_AMBULATORY_CARE_PROVIDER_SITE_OTHER): Payer: Self-pay | Admitting: Physician Assistant

## 2023-01-21 ENCOUNTER — Encounter: Payer: Self-pay | Admitting: Internal Medicine

## 2023-01-21 VITALS — BP 128/76 | HR 73 | Temp 98.5°F | Ht 67.0 in | Wt 222.0 lb

## 2023-01-21 DIAGNOSIS — E559 Vitamin D deficiency, unspecified: Secondary | ICD-10-CM

## 2023-01-21 DIAGNOSIS — E7800 Pure hypercholesterolemia, unspecified: Secondary | ICD-10-CM | POA: Diagnosis not present

## 2023-01-21 DIAGNOSIS — Z6834 Body mass index (BMI) 34.0-34.9, adult: Secondary | ICD-10-CM

## 2023-01-21 DIAGNOSIS — E88819 Insulin resistance, unspecified: Secondary | ICD-10-CM

## 2023-01-21 DIAGNOSIS — E538 Deficiency of other specified B group vitamins: Secondary | ICD-10-CM

## 2023-01-21 DIAGNOSIS — E669 Obesity, unspecified: Secondary | ICD-10-CM

## 2023-01-21 MED ORDER — VITAMIN D (ERGOCALCIFEROL) 1.25 MG (50000 UNIT) PO CAPS
50000.0000 [IU] | ORAL_CAPSULE | ORAL | 0 refills | Status: DC
Start: 2023-01-21 — End: 2023-02-20

## 2023-01-21 NOTE — Progress Notes (Signed)
.smr  Office: [phone]  /  Fax: [phone]  WEIGHT SUMMARY AND BIOMETRICS  Vitals Temp: 98.5 F (36.9 C) BP: 128/76 Pulse Rate: 73 SpO2: 98 %     Anthropometric Measurements Height: 5\' 7"  (1.702 m) Weight: 222 lb (100.7 kg) BMI (Calculated): 34.76 Weight at Last Visit: 220 lb Weight Lost Since Last Visit: 0 lb Weight Gained Since Last Visit: 2 lb Starting Weight: 228 lb Total Weight Loss (lbs): 6 lb (2.722 kg) Peak Weight: 234 lb     Body Composition  Body Fat %: 43.0 % Fat Mass (lbs): 95.4 lbs Muscle Mass (lbs): 120.2 lbs Total Body Water (lbs): 83.2 lbs Visceral Fat Rating : 11     Other Clinical Data RMR: 1642 Fasting: yes Labs: no Today's Visit #: 7 Starting Date: 09/04/22    HPI  Chief Complaint: OBESITY  Mayumi is here to discuss her progress with her obesity treatment plan. She is on the the Category 3 Plan and states she is following her eating plan approximately 50 % of the time. She states she is exercising walking/weights 30 minutes 3 times per week.   Interval History:  Since last office visit she up 2 lbs.   The patient, who is on a treatment plan for obesity, presents for a follow-up visit.  She reports a disruption in her routine due to her husband's recent surgery about 3 weeks ago, which has affected her ability to maintain her diet and exercise regimen.  She has been taking vitamin D and B12 supplements as part of her treatment plan. The patient also mentions experiencing night sweats, which she suspects might be related to menopause, and irregular bowel movements.  She has been trying to increase her fiber intake to improve her bowel movements but has not noticed a significant difference after taking psyllium husk capsules.  The patient also reports struggling with maintaining a diet rich in vegetables and expresses concern about the volume of food she is consuming.  Despite these challenges, she has managed to maintain her muscle  mass.  Pharmacotherapy: None for weight loss.   TREATMENT PLAN FOR OBESITY:  Maintained muscle mass despite decreased gym attendance due to husband's postoperative care. Slight increase in adipose tissue and water weight, possibly due to dietary changes. Blood pressure within normal range. -Encouraged to resume regular gym attendance and adhere to dietary plan. -Consider Metformin for insulin resistance and modest weight loss. Recommended Dietary Goals  Alyana is currently in the action stage of change. As such, her goal is to continue weight management plan. She has agreed to the Category 3 Plan.  Behavioral Intervention  We discussed the following Behavioral Modification Strategies today: continue to work on maintaining a reduced calorie state, getting the recommended amount of protein, incorporating whole foods, making healthy choices, staying well hydrated and practicing mindfulness when eating..  Additional resources provided today: NA  Recommended Physical Activity Goals  Nou has been advised to work up to 150 minutes of moderate intensity aerobic activity a week and strengthening exercises 2-3 times per week for cardiovascular health, weight loss maintenance and preservation of muscle mass.   She has agreed to Continue current level of physical activity  and Increase physical activity in their day and reduce sedentary time (increase NEAT).   Pharmacotherapy We discussed various medication options to help Jennilee with her weight loss efforts and we both agreed to continue to work on nutritional and behavioral strategies to promote weight loss.     Return in about 4  weeks (around 02/18/2023).Marland Kitchen She was informed of the importance of frequent follow up visits to maximize her success with intensive lifestyle modifications for her multiple health conditions.  PHYSICAL EXAM:  Blood pressure 128/76, pulse 73, temperature 98.5 F (36.9 C), height 5\' 7"  (1.702 m), weight 222 lb  (100.7 kg), SpO2 98%. Body mass index is 34.77 kg/m.  General: She is overweight, cooperative, alert, well developed, and in no acute distress. PSYCH: Has normal mood, affect and thought process.   Cardiovascular: HR 70's BP 128/76 Lungs: Normal breathing effort, no conversational dyspnea.  DIAGNOSTIC DATA REVIEWED:  BMET    Component Value Date/Time   NA 138 07/15/2022 1544   K 3.8 07/15/2022 1544   CL 103 07/15/2022 1544   CO2 26 07/15/2022 1544   GLUCOSE 86 07/15/2022 1544   BUN 11 07/15/2022 1544   CREATININE 0.75 07/15/2022 1544   CALCIUM 9.4 07/15/2022 1544   Lab Results  Component Value Date   HGBA1C 5.4 09/04/2022   HGBA1C 5.4 07/03/2022   Lab Results  Component Value Date   INSULIN 10.3 09/04/2022   Lab Results  Component Value Date   TSH 2.26 07/15/2022   CBC    Component Value Date/Time   WBC 7.5 07/15/2022 1544   RBC 4.69 07/15/2022 1544   HGB 14.1 07/15/2022 1544   HCT 41.3 07/15/2022 1544   PLT 244.0 07/15/2022 1544   MCV 88.2 07/15/2022 1544   MCHC 34.0 07/15/2022 1544   RDW 13.7 07/15/2022 1544   Iron Studies    Component Value Date/Time   IRON 60 06/25/2019 0801   FERRITIN 50.2 07/15/2022 1544   IRONPCTSAT 17.6 (L) 06/25/2019 0801   Lipid Panel     Component Value Date/Time   CHOL 194 09/04/2022 1506   TRIG 60 09/04/2022 1506   HDL 66 09/04/2022 1506   CHOLHDL 2.9 09/04/2022 1506   CHOLHDL 3 07/03/2022 0758   VLDL 12.6 07/03/2022 0758   LDLCALC 117 (H) 09/04/2022 1506   Hepatic Function Panel     Component Value Date/Time   PROT 7.7 07/15/2022 1544   ALBUMIN 4.6 07/15/2022 1544   AST 27 07/15/2022 1544   ALT 24 07/15/2022 1544   ALKPHOS 73 07/15/2022 1544   BILITOT 0.6 07/15/2022 1544   BILIDIR 0.2 07/03/2022 0758      Component Value Date/Time   TSH 2.26 07/15/2022 1544   Nutritional Lab Results  Component Value Date   VD25OH 29.74 (L) 07/15/2022   VD25OH 29.21 (L) 07/03/2022   VD25OH 36.85 07/10/2021     ASSOCIATED CONDITIONS ADDRESSED TODAY  ASSESSMENT AND PLAN  Problem List Items Addressed This Visit     Hypercholesteremia   Relevant Medications   EPIPEN 2-PAK 0.3 MG/0.3ML SOAJ injection   Other Relevant Orders   Lipid Panel With LDL/HDL Ratio   Vitamin D deficiency - Primary   Relevant Medications   Vitamin D, Ergocalciferol, (DRISDOL) 1.25 MG (50000 UNIT) CAPS capsule   Other Relevant Orders   VITAMIN D 25 Hydroxy (Vit-D Deficiency, Fractures)   Generalized obesity with inital BMI 36   Vitamin B 12 deficiency   Relevant Orders   Vitamin B12   CBC with Differential/Platelet   Other Visit Diagnoses     Insulin resistance       Relevant Orders   Hemoglobin A1c   CMP14+EGFR   Insulin, random       Vitamin D Deficiency On Ergocalciferol 50,000 units weekly. No N/V or muscle weakness with Ergocalciferol.   Last  Vitamin D level checked at the start of treatment at 29.74.  Patient feels energy levels may be somewhat improved.  Plan:  -Order Vitamin D level today. -Continue Ergocalciferol 50,000 units weekly and adjust supplementation accordingly.  Low vitamin D levels can be associated with adiposity and may result in leptin resistance and weight gain. Also associated with fatigue. Currently on vitamin D supplementation without any adverse effects.  .  Vitamin B12 Deficiency Reports pins and needles sensation in left foot, possibly related to B12 deficiency. On B12 injections. Plan: -Order B12 level today. -Continue B12 injections per PCP.  Insulin Resistance Mild insulin resistance noted in the past. No recent A1c or insulin level checked. -Order A1c and fasting insulin level.  Constipation Reports irregular bowel movements. Tried psyllium husk without significant improvement. -Increase psyllium husk intake to two capsules twice daily. -Consider adding a probiotic.  Sleep Disturbance Reports poor sleep and night sweats. Possible menopausal  symptoms. -Recommend follow-up with GYN for possible hormonal treatment. -Consider estrogen patch if postmenopausal.  Hyperlipidemia History of elevated LDL. -Order lipid panel.   ATTESTASTION STATEMENTS:  Reviewed by clinician on day of visit: allergies, medications, problem list, medical history, surgical history, family history, social history, and previous encounter notes.   I have personally spent 30 minutes total time today in preparation, patient care, nutritional counseling and documentation for this visit, including the following: review of clinical lab tests; review of medical tests/procedures/services.      Hyde Sires, PA-C

## 2023-01-27 ENCOUNTER — Other Ambulatory Visit: Payer: Self-pay | Admitting: Medical Genetics

## 2023-01-27 DIAGNOSIS — Z006 Encounter for examination for normal comparison and control in clinical research program: Secondary | ICD-10-CM

## 2023-01-28 ENCOUNTER — Ambulatory Visit: Payer: BC Managed Care – PPO | Admitting: Internal Medicine

## 2023-01-28 ENCOUNTER — Encounter (INDEPENDENT_AMBULATORY_CARE_PROVIDER_SITE_OTHER): Payer: Self-pay | Admitting: Physician Assistant

## 2023-01-28 ENCOUNTER — Encounter: Payer: Self-pay | Admitting: Internal Medicine

## 2023-01-28 VITALS — BP 118/64 | HR 70 | Temp 98.6°F | Ht 67.0 in | Wt 226.0 lb

## 2023-01-28 DIAGNOSIS — E559 Vitamin D deficiency, unspecified: Secondary | ICD-10-CM | POA: Diagnosis not present

## 2023-01-28 DIAGNOSIS — I10 Essential (primary) hypertension: Secondary | ICD-10-CM | POA: Diagnosis not present

## 2023-01-28 DIAGNOSIS — E538 Deficiency of other specified B group vitamins: Secondary | ICD-10-CM | POA: Diagnosis not present

## 2023-01-28 DIAGNOSIS — H6993 Unspecified Eustachian tube disorder, bilateral: Secondary | ICD-10-CM

## 2023-01-28 DIAGNOSIS — J069 Acute upper respiratory infection, unspecified: Secondary | ICD-10-CM | POA: Diagnosis not present

## 2023-01-28 MED ORDER — GUAIFENESIN ER 600 MG PO TB12: 1200.0000 mg | ORAL_TABLET | Freq: Two times a day (BID) | ORAL | 1 refills | Status: DC | PRN

## 2023-01-28 MED ORDER — CYANOCOBALAMIN 1000 MCG/ML IJ SOLN
1000.0000 ug | Freq: Once | INTRAMUSCULAR | Status: AC
Start: 2023-01-28 — End: 2023-01-28
  Administered 2023-01-28: 1000 ug via INTRAMUSCULAR

## 2023-01-28 MED ORDER — DOXYCYCLINE HYCLATE 100 MG PO TABS: 100.0000 mg | ORAL_TABLET | Freq: Two times a day (BID) | ORAL | 0 refills | Status: DC

## 2023-01-28 NOTE — Progress Notes (Signed)
Patient ID: Hannah Cain, female   DOB: 06-23-1970, 52 y.o.   MRN: [id number]        Chief Complaint: follow up URI symptoms, low vit d, htn, low b12       HPI:  Hannah Cain is a 52 y.o. female  Here with 2-3 days acute onset fever, facial pain, pressure, headache, general weakness and malaise, and greenish d/c, with mild ST and cough, but pt denies chest pain, wheezing, increased sob or doe, orthopnea, PND, increased LE swelling, palpitations, dizziness or syncope.   Pt denies polydipsia, polyuria, or new focal neuro s/s.    Pt denies fever, wt loss, night sweats, loss of appetite, or other constitutional symptom    Also with bialteral ear fullness and cracklling and popping       Wt Readings from Last 3 Encounters:  01/28/23 226 lb (102.5 kg)  01/21/23 222 lb (100.7 kg)  12/26/22 220 lb (99.8 kg)   BP Readings from Last 3 Encounters:  01/28/23 118/64  01/21/23 128/76  12/26/22 119/79         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 She has never used smokeless tobacco. She reports that she does not drink alcohol and does not use drugs. family history includes Breast cancer in her maternal grandmother; COPD in her maternal grandmother; Colon cancer (age of onset: 63) in her brother; Colon polyps in her maternal grandmother; Heart failure in her maternal grandmother; Hyperlipidemia in her maternal  grandmother; Hypertension in her maternal grandmother and mother; Hypothyroidism in her mother; Liver cancer in her brother; Obesity in her father; Stroke in her maternal grandmother; Thyroid disease in her mother; Vitiligo in her mother. No Known Allergies Current Outpatient Medications on File Prior to Visit  Medication Sig Dispense Refill   Cyanocobalamin (VITAMIN B-12 IJ) Inject as directed.     EPIPEN 2-PAK 0.3 MG/0.3ML SOAJ injection as directed Injection as directed for 30 days     Fexofenadine HCl (ALLEGRA ALLERGY PO) Take by mouth.     Multiple Vitamin (MULTIVITAMIN) tablet Take 1 tablet by mouth daily.     norethindrone (MICRONOR) 0.35 MG tablet Take 1 tablet by mouth daily.     Olopatadine HCl 0.6 % SOLN Place 2 sprays into both nostrils 2 (two) times daily.     Vitamin D, Ergocalciferol, (DRISDOL) 1.25 MG (50000 UNIT) CAPS capsule Take 1 capsule (50,000 Units total) by mouth every 7 (seven) days. 5 capsule 0   No current facility-administered medications on file prior to visit.        ROS:  All others reviewed and negative.  Objective        PE:  BP 118/64 (BP Location: Right Arm, Patient Position: Sitting, Cuff Size:  Normal)   Pulse 70   Temp 98.6 F (37 C) (Oral)   Ht 5\' 7"  (1.702 m)   Wt 226 lb (102.5 kg)   SpO2 98%   BMI 35.40 kg/m                 Constitutional: Pt appears in NAD               HENT: Head: NCAT.                Right Ear: External ear normal.                 Left Ear: External ear normal. Bilat tm's with mild erythema.  Max sinus areas mild tender.  Pharynx with mild erythema, no exudate               Eyes: . Pupils are equal, round, and reactive to light. Conjunctivae and EOM are normal               Nose: without d/c or deformity               Neck: Neck supple. Gross normal ROM               Cardiovascular: Normal rate and regular rhythm.                 Pulmonary/Chest: Effort normal and breath sounds without rales or wheezing.                               Neurological: Pt is alert. At baseline orientation, motor grossly intact               Skin: Skin is warm. No rashes, no other new lesions, LE edema - none               Psychiatric: Pt behavior is normal without agitation   Micro: none  Cardiac tracings I have personally interpreted today:  none  Pertinent Radiological findings (summarize): none   Lab Results  Component Value Date   WBC 5.8 01/31/2023   HGB 13.5 01/31/2023   HCT 41.9 01/31/2023   PLT 204 01/31/2023   GLUCOSE 93 01/31/2023   CHOL 183 01/31/2023   TRIG 57 01/31/2023   HDL 63 01/31/2023   LDLCALC 109 (H) 01/31/2023   ALT 13 01/31/2023   AST 17 01/31/2023   NA 139 01/31/2023   K 4.6 01/31/2023   CL 104 01/31/2023   CREATININE 0.76 01/31/2023   BUN 16 01/31/2023   CO2 23 01/31/2023   TSH 2.26 07/15/2022   HGBA1C 5.6 01/31/2023   Assessment/Plan:  Hannah Cain is a 52 y.o. White or Caucasian [1] female with  has a past medical history of ALLERGIC RHINITIS (03/17/2007), Anemia, Arthritis, B12 deficiency, Back pain, Basal cell carcinoma (BCC) (06/10/2017), Constipation, GERD (gastroesophageal reflux disease), High blood pressure, HLD (hyperlipidemia) (06/10/2017), Joint pain, Palpitations (11/12/2017), Symptomatic PVCs (06/11/2018), URI (07/19/2009), Vitamin B 12 deficiency, and Vitamin D deficiency.  Vitamin D deficiency Last vitamin D Lab Results  Component Value Date   VD25OH 61.2 01/31/2023   Stable, cont oral replacement   Hypertension BP Readings from Last 3 Encounters:  01/28/23 118/64  01/21/23 128/76  12/26/22 119/79   Stable, pt to continue medical treatment  - diet, wt control   B12 deficiency Lab Results  Component Value Date   VITAMINB12 1,097 01/31/2023   Stable, cont  oral replacement - b12 1000 mcg qd   Acute upper respiratory infection Mild to mod, for antibx course doxycycline 100 bid,  to f/u any worsening symptoms or concerns  Acute dysfunction of Eustachian  tube, bilateral Mild to mod, for mucinex bid prn,  to f/u any worsening symptoms or concerns  Followup: Return if symptoms worsen or fail to improve.  Oliver Barre, MD 02/01/2023 4:43 PM [facility]

## 2023-01-28 NOTE — Patient Instructions (Signed)
Your COVID testing is negative  Please take all new medication as prescribed-  the antibiotic, and mucinex twice per day as needed  Please continue all other medications as before, and refills have been done if requested.  Please have the pharmacy call with any other refills you may need.  Please keep your appointments with your specialists as you may have planned

## 2023-01-29 ENCOUNTER — Ambulatory Visit: Payer: BC Managed Care – PPO

## 2023-01-30 NOTE — Telephone Encounter (Signed)
Thank you :)

## 2023-02-01 ENCOUNTER — Encounter: Payer: Self-pay | Admitting: Internal Medicine

## 2023-02-01 DIAGNOSIS — H6993 Unspecified Eustachian tube disorder, bilateral: Secondary | ICD-10-CM | POA: Insufficient documentation

## 2023-02-01 LAB — CMP14+EGFR
ALT: 13 [IU]/L (ref 0–32)
AST: 17 [IU]/L (ref 0–40)
Albumin: 4.4 g/dL (ref 3.8–4.9)
Alkaline Phosphatase: 91 [IU]/L (ref 44–121)
BUN/Creatinine Ratio: 21 (ref 9–23)
BUN: 16 mg/dL (ref 6–24)
Bilirubin Total: 0.6 mg/dL (ref 0.0–1.2)
CO2: 23 mmol/L (ref 20–29)
Calcium: 9.1 mg/dL (ref 8.7–10.2)
Chloride: 104 mmol/L (ref 96–106)
Creatinine, Ser: 0.76 mg/dL (ref 0.57–1.00)
Globulin, Total: 2.6 g/dL (ref 1.5–4.5)
Glucose: 93 mg/dL (ref 70–99)
Potassium: 4.6 mmol/L (ref 3.5–5.2)
Sodium: 139 mmol/L (ref 134–144)
Total Protein: 7 g/dL (ref 6.0–8.5)
eGFR: 95 mL/min/{1.73_m2} (ref >59)

## 2023-02-01 LAB — CBC WITH DIFFERENTIAL/PLATELET
Basophils Absolute: 0 10*3/uL (ref 0.0–0.2)
Basos: 0 %
EOS (ABSOLUTE): 0.2 10*3/uL (ref 0.0–0.4)
Eos: 4 %
Hematocrit: 41.9 % (ref 34.0–46.6)
Hemoglobin: 13.5 g/dL (ref 11.1–15.9)
Immature Grans (Abs): 0 10*3/uL (ref 0.0–0.1)
Immature Granulocytes: 0 %
Lymphocytes Absolute: 2 10*3/uL (ref 0.7–3.1)
Lymphs: 35 %
MCH: 30 pg (ref 26.6–33.0)
MCHC: 32.2 g/dL (ref 31.5–35.7)
MCV: 93 fL (ref 79–97)
Monocytes Absolute: 0.7 10*3/uL (ref 0.1–0.9)
Monocytes: 12 %
Neutrophils Absolute: 2.8 10*3/uL (ref 1.4–7.0)
Neutrophils: 49 %
Platelets: 204 10*3/uL (ref 150–450)
RBC: 4.5 x10E6/uL (ref 3.77–5.28)
RDW: 12.3 % (ref 11.7–15.4)
WBC: 5.8 10*3/uL (ref 3.4–10.8)

## 2023-02-01 LAB — LIPID PANEL WITH LDL/HDL RATIO
Cholesterol, Total: 183 mg/dL (ref 100–199)
HDL: 63 mg/dL (ref >39)
LDL Chol Calc (NIH): 109 mg/dL — ABNORMAL HIGH (ref 0–99)
LDL/HDL Ratio: 1.7 {ratio} (ref 0.0–3.2)
Triglycerides: 57 mg/dL (ref 0–149)
VLDL Cholesterol Cal: 11 mg/dL (ref 5–40)

## 2023-02-01 LAB — HEMOGLOBIN A1C
Est. average glucose Bld gHb Est-mCnc: 114 mg/dL
Hgb A1c MFr Bld: 5.6 % (ref 4.8–5.6)

## 2023-02-01 LAB — VITAMIN B12: Vitamin B-12: 1097 pg/mL (ref 232–1245)

## 2023-02-01 LAB — VITAMIN D 25 HYDROXY (VIT D DEFICIENCY, FRACTURES): Vit D, 25-Hydroxy: 61.2 ng/mL (ref 30.0–100.0)

## 2023-02-01 LAB — INSULIN, RANDOM: INSULIN: 9.6 u[IU]/mL (ref 2.6–24.9)

## 2023-02-01 NOTE — Assessment & Plan Note (Signed)
Lab Results  Component Value Date   VITAMINB12 1,097 01/31/2023   Stable, cont oral replacement - b12 1000 mcg qd

## 2023-02-01 NOTE — Assessment & Plan Note (Signed)
Mild to mod, for mucinex bid prn,  to f/u any worsening symptoms or concerns 

## 2023-02-01 NOTE — Assessment & Plan Note (Signed)
Mild to mod, for antibx course doxycycline 100 bid,  to f/u any worsening symptoms or concerns 

## 2023-02-01 NOTE — Assessment & Plan Note (Signed)
BP Readings from Last 3 Encounters:  01/28/23 118/64  01/21/23 128/76  12/26/22 119/79   Stable, pt to continue medical treatment  - diet, wt control

## 2023-02-01 NOTE — Assessment & Plan Note (Signed)
Last vitamin D Lab Results  Component Value Date   VD25OH 61.2 01/31/2023   Stable, cont oral replacement

## 2023-02-19 ENCOUNTER — Other Ambulatory Visit
Admission: RE | Admit: 2023-02-19 | Discharge: 2023-02-19 | Disposition: A | Discharge status: Home / Self Care | Payer: BC Managed Care – PPO | Source: Ambulatory Visit | Attending: Medical Genetics | Admitting: Medical Genetics

## 2023-02-19 DIAGNOSIS — Z006 Encounter for examination for normal comparison and control in clinical research program: Secondary | ICD-10-CM | POA: Insufficient documentation

## 2023-02-20 ENCOUNTER — Encounter (INDEPENDENT_AMBULATORY_CARE_PROVIDER_SITE_OTHER): Payer: Self-pay | Admitting: Physician Assistant

## 2023-02-20 ENCOUNTER — Ambulatory Visit (INDEPENDENT_AMBULATORY_CARE_PROVIDER_SITE_OTHER): Payer: BC Managed Care – PPO | Admitting: Physician Assistant

## 2023-02-20 ENCOUNTER — Encounter: Payer: Self-pay | Admitting: Internal Medicine

## 2023-02-20 VITALS — BP 134/81 | HR 78 | Temp 98.5°F | Ht 67.0 in | Wt 224.0 lb

## 2023-02-20 DIAGNOSIS — E559 Vitamin D deficiency, unspecified: Secondary | ICD-10-CM

## 2023-02-20 DIAGNOSIS — G479 Sleep disorder, unspecified: Secondary | ICD-10-CM

## 2023-02-20 DIAGNOSIS — E88819 Insulin resistance, unspecified: Secondary | ICD-10-CM | POA: Diagnosis not present

## 2023-02-20 DIAGNOSIS — K5901 Slow transit constipation: Secondary | ICD-10-CM

## 2023-02-20 DIAGNOSIS — E538 Deficiency of other specified B group vitamins: Secondary | ICD-10-CM

## 2023-02-20 DIAGNOSIS — Z6835 Body mass index (BMI) 35.0-35.9, adult: Secondary | ICD-10-CM

## 2023-02-20 DIAGNOSIS — Z72820 Sleep deprivation: Secondary | ICD-10-CM | POA: Insufficient documentation

## 2023-02-20 DIAGNOSIS — R202 Paresthesia of skin: Secondary | ICD-10-CM

## 2023-02-20 DIAGNOSIS — E669 Obesity, unspecified: Secondary | ICD-10-CM

## 2023-02-20 DIAGNOSIS — F439 Reaction to severe stress, unspecified: Secondary | ICD-10-CM

## 2023-02-20 MED ORDER — VITAMIN D3 125 MCG (5000 UT) PO CAPS
5000.0000 [IU] | ORAL_CAPSULE | Freq: Every day | ORAL | 0 refills | Status: DC
Start: 2023-02-20 — End: 2023-07-21

## 2023-02-20 NOTE — Progress Notes (Signed)
Patient ID: Hannah Cain, female   DOB: Sep 05, 1970, 52 y.o.   MRN: [id number]   SUBJECTIVE:  Chief Complaint: Obesity  Interim History: Hannah Cain is a 52 year old individual with a history of obesity, insulin resistance, and vitamin D and B12 deficiencies, presents for a follow-up visit regarding her obesity treatment plan. She also has a history of constipation, which has reportedly improved recently.  The patient has been undergoing treatment for actinic lesions on her face with fluorouracil, which has caused significant discomfort and skin changes, including stinging, oozing, and cracking. She has been managing these symptoms with lotion and Aquaphor. The patient has a history of basal cell carcinoma treated with Mohs surgery and has recently noticed changes in the same area, prompting the current treatment.  The patient reports experiencing pins and needles and numbness in her left side, which has recently returned in her foot and leg. This symptom had previously prompted investigation and treatment for vitamin B12 deficiency. The patient's rheumatologist has recommended a neurology consultation for these symptoms.  The patient has been struggling with sleep, often waking up multiple times during the night and having difficulty returning to sleep. She reports that leg discomfort sometimes wakes her up. She has tried melatonin in the past without significant improvement.  The patient has been less active recently due to the discomfort in her leg. She has been trying to maintain some physical activity, including arm weights and squats.  She has also been dealing with significant emotional stress, including the loss of her brother to colon cancer and a friend's brother's suicide. She reports feeling down over the past month and has been comfort eating more frequently during this time.  We discussed some strategies for comfort eating.  The patient has been taking vitamin D once every seven days and  has been receiving B12 injections. She has also been trying to be more consistent with psyllium fiber for her constipation. She has been taking magnesium glycinate, which seems to help with sleep sometimes.  [name] is here to discuss her progress with her obesity treatment plan. She is on the Category 3 Plan and states she is following her eating plan approximately 40-50 % of the time. She states she is not exercising but is trying to do upper body strengthening exercises several times per week.   OBJECTIVE: Visit Diagnoses: Problem List Items Addressed This Visit     Vitamin D deficiency   Relevant Medications   Cholecalciferol (VITAMIN D3) 125 MCG (5000 UT) CAPS   Generalized obesity with inital BMI 36   Vitamin B 12 deficiency   BMI 35.0-35.9,adult   Insulin resistance - Primary   Slow transit constipation   Poor sleep   Stress   Obesity Follow-up for obesity treatment. Reports decreased activity due to paresthesia in leg and foot, and emotional stress from recent personal losses. Weight has increased slightly. She has maintained muscle mass and is slightly up 2 lbs in adipose mass.  Discussed strategies for managing weight during Thanksgiving, including portion control and postprandial walking to prevent glucose spikes. - Focus on protein intake - Increase consistency with psyllium fiber - Encourage restorative sleep - Discuss comfort eating and alternative coping strategies - Follow-up on March 26, 2023 at 4 PM  Insulin Resistance A1c at 5.6, slightly increased, but not in prediabetic range. Insulin level lower at 9.6, indicating reduced insulin resistance. Continue current management. Discussed benefits of postprandial walking to prevent glucose spikes. Continue working on nutrition plan to decrease simple carbohydrates,  increase lean proteins and exercise to promote weight loss, improve glycemic control and prevent progression to Type 2 diabetes.  - Encourage postprandial  walking  Vitamin B12 Deficiency B12 levels improved at 1097 with injections, but recent test may be falsely elevated due to recent injection. Symptoms of paresthesia and numbness in left leg and foot have returned. She is being referred to neurologist for further evaluation. - Continue B12 injections per PCP - Has been referred to neurologist for further evaluation of paresthesia and numbness  Vitamin D Deficiency Vitamin D level improved to 61.2 after taking Ergocalciferol, within goal range of 50-70. Transition from prescription to over-the-counter vitamin D 5000 units daily. Discussed benefits of maintaining adequate vitamin D levels for energy and overall health. - Start vitamin D 5000 units daily  Actinic Keratosis Used fluorouracil for actinic lesions on the nose, experienced significant irritation and stopped treatment. Using Aquaphor for symptom relief. Discussed the importance of sunscreen use to prevent further lesions. - Continue using Aquaphor for symptom relief - Use Elta MD sunscreen  Constipation Reports improvement with increased consistency in using psyllium husk. Discussed importance of fiber and fluid intake for managing constipation. - Continue psyllium husk capsules 2-4 capsules daily - Ensure 80-100 ounces of fluid intake daily  Sleep Disturbance Reports difficulty staying asleep, possibly related to leg pain and emotional stress. Discussed non-pharmacological interventions such as sleep yoga and potential use of time-release melatonin. - Try sleep yoga with Hansel Starling - Consider time-release melatonin if needed She has referral to neurology pending  Emotional Stress Experiencing emotional stress due to recent personal losses. Reports not feeling depressed but acknowledges a tough month. Discussed importance of self-care activities and support from friends and family. - Encourage self-care activities - Discuss importance of support from friends and family  General  Health Maintenance Reviewed lab results: electrolytes, kidney function, liver function, cholesterol panel, and complete blood count all normal. Discussed strategies for Thanksgiving meal to manage weight and insulin resistance. - Encourage balanced meal portions during Thanksgiving - Encourage postprandial walking  Follow-up - Follow-up on March 26, 2023 at 4 PM. Vitals Temp: 98.5 F (36.9 C) BP: 134/81 Pulse Rate: 78 SpO2: 98 %   Anthropometric Measurements Height: 5\' 7"  (1.702 m) Weight: 224 lb (101.6 kg) BMI (Calculated): 35.08 Weight at Last Visit: 222 lb Weight Lost Since Last Visit: 0 Weight Gained Since Last Visit: 2 lb Starting Weight: 228 lb Total Weight Loss (lbs): 4 lb (1.814 kg) Peak Weight: 234 lb   Body Composition  Body Fat %: 43.4 % Fat Mass (lbs): 97.4 lbs Muscle Mass (lbs): 120.4 lbs Total Body Water (lbs): 83.6 lbs Visceral Fat Rating : 11   Other Clinical Data RMR: 1642 Fasting: no Labs: no Today's Visit #: 8 Starting Date: 09/04/22   General: She is overweight, cooperative, alert, well developed, and in no acute distress. PSYCH: Has normal mood, affect and thought process.   Cardiovascular: HR 70's BP 134/81 Lungs: Normal breathing effort, no conversational dyspnea.  ASSESSMENT AND PLAN:  Diet: Thedora is currently in the action stage of change. As such, her goal is to continue with weight loss efforts. She has agreed to Category 3 Plan.  Exercise: Myley has been instructed that some exercise is better than none and consider trying a stationary bike for exercise and start with 5 minutes at a time  for weight loss and overall health benefits.   Behavior Modification:  We discussed the following Behavioral Modification Strategies today: increasing lean protein intake, decreasing  simple carbohydrates, increasing vegetables, increase H2O intake, increase high fiber foods, emotional eating strategies , and holiday eating strategies. We  discussed various medication options to help Keneshia with her weight loss efforts and we both agreed to continue to work on nutritional and behavioral strategies to promote weight loss.  .  Return in about 4 weeks (around 03/20/2023).Marland Kitchen She was informed of the importance of frequent follow up visits to maximize her success with intensive lifestyle modifications for her multiple health conditions.  Attestation Statements:   Reviewed by clinician on day of visit: allergies, medications, problem list, medical history, surgical history, family history, social history, and previous encounter notes.   Time spent on visit including pre-visit chart review and post-visit care and charting was 48 minutes.    Shivaun Bilello, PA-C

## 2023-02-21 ENCOUNTER — Encounter: Payer: Self-pay | Admitting: Neurology

## 2023-02-28 ENCOUNTER — Ambulatory Visit: Payer: BC Managed Care – PPO

## 2023-02-28 DIAGNOSIS — Z23 Encounter for immunization: Secondary | ICD-10-CM

## 2023-02-28 DIAGNOSIS — E538 Deficiency of other specified B group vitamins: Secondary | ICD-10-CM | POA: Diagnosis not present

## 2023-02-28 MED ORDER — CYANOCOBALAMIN 1000 MCG/ML IJ SOLN
1000.0000 ug | Freq: Once | INTRAMUSCULAR | Status: AC
  Administered 2023-02-28: 1000 ug via INTRAMUSCULAR

## 2023-02-28 NOTE — Progress Notes (Signed)
Pt here for monthly B12 injection per Dr. Jonny Ruiz  B12 given IM and pt tolerated injection well.  Next B12 injection scheduled for 03/31/2023  Patient also present in the office today for her flu shot. Regular dose flu shot was administered into her right deltoid muscle. Patient tolerated the injection well and the injection site looked weel.  Patient was advise to report to the office immediately if she notices any adverse reactions for either injection. She gave a verbal understanding.

## 2023-03-01 LAB — GENECONNECT MOLECULAR SCREEN

## 2023-03-01 LAB — HELIX MOLECULAR SCREEN: Genetic Analysis Overall Interpretation: NEGATIVE

## 2023-03-11 ENCOUNTER — Encounter: Payer: Self-pay | Admitting: Neurology

## 2023-03-11 ENCOUNTER — Ambulatory Visit: Payer: BC Managed Care – PPO | Admitting: Neurology

## 2023-03-11 VITALS — BP 130/83 | HR 70 | Ht 67.0 in | Wt 234.0 lb

## 2023-03-11 DIAGNOSIS — M5416 Radiculopathy, lumbar region: Secondary | ICD-10-CM | POA: Diagnosis not present

## 2023-03-11 DIAGNOSIS — R292 Abnormal reflex: Secondary | ICD-10-CM | POA: Diagnosis not present

## 2023-03-11 NOTE — Patient Instructions (Signed)
MRI lumbar spine    

## 2023-03-11 NOTE — Progress Notes (Signed)
[facility] Clinic Note - Initial Visit   Date: 03/11/2023   Hannah Cain MRN: [id number] DOB: 1971-03-12   Dear Dr. Jonny Ruiz:  Thank you for your kind referral of Hannah Cain for consultation of left leg numbness. Although her history is well known to you, please allow [name] to reiterate it for the purpose of our medical record. The patient was accompanied to the clinic by self.   TREVI REVOLORIO is a 52 y.o. right-handed female presenting for evaluation of left leg numbness/tingling.   IMPRESSION/PLAN: Left leg numbness tingling follows the L5 dermatome.  Peroneal neuropathy seems less likely with intact motor strength.  Additionally, brisk patella reflexes may suggest underlying lumbar canal stenosis.  To further evaluation, I MRI lumbar spine wo contrast will be performed.  She has completed PT with limited benefit. Consider EMG, if imaging is nondiagnostic.  Further recommendations pending results.   ------------------------------------------------------------- History of present illness: She has numbness involving the left foot and lateral leg, which has been getting worse since April 2024.  There was no preceding injury.   Symptoms are constant in the lower leg and intermittent in the foot.  Symptoms are not worse with activity.  She denies low back pain or radicular leg pain. She has some weakness in the left leg.  She has completed PT with some benefit.    Out-side paper records, electronic medical record, and images have been reviewed where available and summarized as:  Lab Results  Component Value Date   HGBA1C 5.6 01/31/2023   Lab Results  Component Value Date   VITAMINB12 1,097 01/31/2023   Lab Results  Component Value Date   TSH 2.26 07/15/2022   Lab Results  Component Value Date   ESRSEDRATE 30 07/15/2022    Past Medical History:  Diagnosis Date   ALLERGIC RHINITIS 03/17/2007   Anemia    Arthritis    B12 deficiency    Back pain     Basal cell carcinoma (BCC) 06/10/2017   Constipation    GERD (gastroesophageal reflux disease)    High blood pressure    only for 6 months( due to birth control)   HLD (hyperlipidemia) 06/10/2017   Joint pain    Palpitations 11/12/2017   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   URI 07/19/2009   Vitamin B 12 deficiency    Vitamin D deficiency     Past Surgical History:  Procedure Laterality Date   BREAST BIOPSY Right 1990   MOHS SURGERY  2018   nose   MOUTH SURGERY       Medications:  Outpatient Encounter Medications as of 03/11/2023  Medication Sig   Cholecalciferol (VITAMIN D3) 125 MCG (5000 UT) CAPS Take 1 capsule (5,000 Units total) by mouth daily.   Cyanocobalamin (VITAMIN B-12 IJ) Inject as directed.   EPIPEN 2-PAK 0.3 MG/0.3ML SOAJ injection as directed Injection as directed for 30 days   Fexofenadine HCl (ALLEGRA ALLERGY PO) Take by mouth.   Lifitegrast (XIIDRA) 5 % SOLN Apply to eye.   Multiple Vitamin (MULTIVITAMIN) tablet Take 1 tablet by mouth daily.   norethindrone (MICRONOR) 0.35 MG tablet Take 1 tablet by mouth daily.   Olopatadine HCl 0.6 % SOLN Place 2 sprays into both nostrils 2 (two) times daily.   doxycycline (VIBRA-TABS) 100 MG tablet Take 1 tablet (100 mg total) by mouth 2 (two) times daily. (Patient not taking: Reported on 03/11/2023)   guaiFENesin (MUCINEX) 600 MG 12 hr tablet Take 2 tablets (1,200 mg total) by  mouth 2 (two) times daily as needed. (Patient not taking: Reported on 03/11/2023)   No facility-administered encounter medications on file as of 03/11/2023.    Allergies: No Known Allergies  Family History: Family History  Problem Relation Age of Onset   Thyroid disease Mother    Hypertension Mother    Hypothyroidism Mother    Vitiligo Mother    Obesity Father    Colon cancer Brother 55       mets to liver   Liver cancer Brother    Breast cancer Maternal Grandmother    Colon polyps Maternal Grandmother    Stroke Maternal Grandmother     Hypertension Maternal Grandmother    Hyperlipidemia Maternal Grandmother    COPD Maternal Grandmother    Heart failure Maternal Grandmother    Rectal cancer Neg Hx    Stomach cancer Neg Hx     Social History: Social History   Tobacco Use   Smoking status: Never   Smokeless tobacco: Never  Vaping Use   Vaping status: Never Used  Substance Use Topics   Alcohol use: No   Drug use: No   Social History   Social History Narrative   Right handed   One story home      Are you right handed or left handed? Right handed    Are you currently employed ? 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 No    Who lives with you? Husband    What type of home do you live in: 1 story or 2 story? Lives in a one story home         Vital Signs:  BP 130/83   Pulse 70   Ht 5\' 7"  (1.702 m)   Wt 234 lb (106.1 kg)   SpO2 100%   BMI 36.65 kg/m   Neurological Exam: MENTAL STATUS including orientation to time, place, person, recent and remote memory, attention span and concentration, language, and fund of knowledge is normal.  Speech is not dysarthric.  CRANIAL NERVES: II:  No visual field defects.     III-IV-VI: Pupils equal round and reactive to light.  Normal conjugate, extra-ocular eye movements in all directions of gaze.  No nystagmus.  No ptosis.   V:  Normal facial sensation.    VII:  Normal facial symmetry and movements.   VIII:  Normal hearing and vestibular function.   IX-X:  Normal palatal movement.   XI:  Normal shoulder shrug and head rotation.   XII:  Normal tongue strength and range of motion, no deviation or fasciculation.  MOTOR:  No atrophy, fasciculations or abnormal movements.  No pronator drift.   Upper Extremity:  Right  Left  Deltoid  5/5   5/5   Biceps  5/5   5/5   Triceps  5/5   5/5   Wrist extensors  5/5   5/5   Wrist flexors  5/5   5/5   Finger extensors  5/5   5/5   Finger flexors  5/5   5/5   Dorsal  interossei  5/5   5/5   Abductor pollicis  5/5   5/5   Tone (Ashworth scale)  0  0   Lower Extremity:  Right  Left  Hip flexors  5/5   5/5   Knee flexors  5/5   5/5   Knee extensors  5/5   5/5   Dorsiflexors  5/5  5/5   Plantarflexors  5/5   5/5   Toe extensors  5/5   5/5   Toe flexors  5/5   5/5   Tone (Ashworth scale)  0  0   MSRs:                                           Right        Left brachioradialis 2+  2+  biceps 2+  2+  triceps 2+  2+  patellar 2+  3+  ankle jerk 2+  2+  Hoffman no  no  plantar response down  down   SENSORY:  Normal and symmetric perception of light touch, pinprick, vibration, and proprioception.  Romberg's sign absent.   COORDINATION/GAIT: Normal finger-to- nose-finger.  Intact rapid alternating movements bilaterally.  Gait narrow based and stable. Tandem and stressed gait intact.     Thank you for allowing me to participate in patient's care.  If I can answer any additional questions, I would be pleased to do so.    Sincerely,    Tarhonda Hollenberg K. Allena Katz, DO

## 2023-03-13 ENCOUNTER — Encounter: Payer: Self-pay | Admitting: Neurology

## 2023-03-15 ENCOUNTER — Ambulatory Visit
Admission: RE | Admit: 2023-03-15 | Discharge: 2023-03-15 | Disposition: A | Discharge status: Home / Self Care | Payer: BC Managed Care – PPO | Source: Ambulatory Visit | Attending: Neurology | Admitting: Neurology

## 2023-03-15 DIAGNOSIS — M5416 Radiculopathy, lumbar region: Secondary | ICD-10-CM

## 2023-03-15 DIAGNOSIS — R292 Abnormal reflex: Secondary | ICD-10-CM

## 2023-03-19 ENCOUNTER — Encounter (INDEPENDENT_AMBULATORY_CARE_PROVIDER_SITE_OTHER): Payer: Self-pay | Admitting: Physician Assistant

## 2023-03-24 ENCOUNTER — Encounter: Payer: Self-pay | Admitting: Neurology

## 2023-03-26 ENCOUNTER — Ambulatory Visit (INDEPENDENT_AMBULATORY_CARE_PROVIDER_SITE_OTHER): Payer: BC Managed Care – PPO | Admitting: Physician Assistant

## 2023-03-27 ENCOUNTER — Other Ambulatory Visit: Payer: BC Managed Care – PPO

## 2023-03-31 ENCOUNTER — Ambulatory Visit (INDEPENDENT_AMBULATORY_CARE_PROVIDER_SITE_OTHER): Payer: BC Managed Care – PPO

## 2023-03-31 DIAGNOSIS — E538 Deficiency of other specified B group vitamins: Secondary | ICD-10-CM | POA: Diagnosis not present

## 2023-03-31 MED ORDER — CYANOCOBALAMIN 1000 MCG/ML IJ SOLN
1000.0000 ug | Freq: Once | INTRAMUSCULAR | Status: AC
  Administered 2023-03-31: 1000 ug via INTRAMUSCULAR

## 2023-03-31 NOTE — Progress Notes (Signed)
After obtaining consent, and per orders of Dr. John, injection of B12 given by Haidynn Almendarez P Kasim Mccorkle. Patient instructed to report any adverse reaction to me immediately.  

## 2023-04-10 ENCOUNTER — Other Ambulatory Visit: Payer: Self-pay

## 2023-04-10 DIAGNOSIS — M5416 Radiculopathy, lumbar region: Secondary | ICD-10-CM

## 2023-04-10 DIAGNOSIS — R202 Paresthesia of skin: Secondary | ICD-10-CM

## 2023-04-10 MED ORDER — GABAPENTIN 100 MG PO CAPS: ORAL_CAPSULE | ORAL | 3 refills | Status: DC

## 2023-04-14 ENCOUNTER — Ambulatory Visit: Payer: BC Managed Care – PPO | Admitting: Neurology

## 2023-05-02 ENCOUNTER — Ambulatory Visit: Payer: 59 | Admitting: Neurology

## 2023-05-02 DIAGNOSIS — R202 Paresthesia of skin: Secondary | ICD-10-CM | POA: Diagnosis not present

## 2023-05-02 DIAGNOSIS — M5416 Radiculopathy, lumbar region: Secondary | ICD-10-CM

## 2023-05-02 NOTE — Procedures (Signed)
  [facility]  [address] [place], [address]  [place], [place] [address] Tel: [phone] Fax: [phone] Test Date:  05/02/2023  Patient: Hannah Cain DOB: [birth year] Physician: Nita Sickle, DO  Sex: Female Height: 5\' 7"  Ref Phys: Nita Sickle, DO  ID#: [id number]   Technician:    History: This is a 53 year old female referred for evaluation of left leg paresthesias and pain.  NCV & EMG Findings: Extensive electrodiagnostic testing of the left lower extremity and additional studies of the right shows:  Left sural and superficial peroneal sensory responses are within normal limits. Left peroneal and tibial motor responses are within normal limits. Left tibial H reflex study is within normal limits. There is no evidence of active or chronic motor axonal loss changes affecting any of the tested muscles.  Motor unit configuration and recruitment pattern is within normal limits.  Impression: This is a normal study of the left lower extremity.  In particular, there is no evidence of a peroneal mononeuropathy, lumbosacral radiculopathy, or large fiber sensorimotor polyneuropathy.   ___________________________ Nita Sickle, DO    Nerve Conduction Studies   Stim Site NR Peak (ms) Norm Peak (ms) O-P Amp (V) Norm O-P Amp  Left Sup Peroneal Anti Sensory (Ant Lat Mall)  32 C  12 cm    2.1 <4.6 7.0 >4  Right Sup Peroneal Anti Sensory (Ant Lat Mall)  32 C  12 cm    3.2 <4.6 5.5 >4  Left Sural Anti Sensory (Lat Mall)  32 C  Calf    3.0 <4.6 14.2 >4     Stim Site NR Onset (ms) Norm Onset (ms) O-P Amp (mV) Norm O-P Amp Site1 Site2 Delta-0 (ms) Dist (cm) Vel (m/s) Norm Vel (m/s)  Left Peroneal Motor (Ext Dig Brev)  32 C  Ankle    3.9 <6.0 5.3 >2.5 B Fib Ankle 7.8 37.0 47 >40  B Fib    11.7  4.5  Poplt B Fib 1.7 9.0 53 >40  Poplt    13.4  4.4         Left Peroneal TA Motor (Tib Ant)  32 C  Fib Head    2.7 <4.5 4.5 >3 Poplit Fib Head 1.2 9.0 75 >40  Poplit    3.9 <5.7 4.1          Left Tibial Motor (Abd Hall Brev)  32 C  Ankle    3.4 <6.0 11.2 >4 Knee Ankle 9.3 40.0 43 >40  Knee    12.7  6.2          Electromyography   Side Muscle Ins.Act Fibs Fasc Recrt Amp Dur Poly Activation Comment  Left AntTibialis Nml Nml Nml Nml Nml Nml Nml Nml N/A  Left Gastroc Nml Nml Nml Nml Nml Nml Nml Nml N/A  Left Flex Dig Long Nml Nml Nml Nml Nml Nml Nml Nml N/A  Left BicepsFemS Nml Nml Nml Nml Nml Nml Nml Nml N/A  Left RectFemoris Nml Nml Nml Nml Nml Nml Nml Nml N/A  Left GluteusMed Nml Nml Nml Nml Nml Nml Nml Nml N/A      Waveforms:

## 2023-05-07 ENCOUNTER — Ambulatory Visit (INDEPENDENT_AMBULATORY_CARE_PROVIDER_SITE_OTHER): Payer: 59

## 2023-05-07 ENCOUNTER — Ambulatory Visit: Payer: BC Managed Care – PPO

## 2023-05-07 DIAGNOSIS — E538 Deficiency of other specified B group vitamins: Secondary | ICD-10-CM

## 2023-05-07 MED ORDER — CYANOCOBALAMIN 1000 MCG/ML IJ SOLN
1000.0000 ug | Freq: Once | INTRAMUSCULAR | Status: AC
  Administered 2023-05-07: 1000 ug via INTRAMUSCULAR

## 2023-05-07 NOTE — Progress Notes (Signed)
Patent visits today for their b-12 injection. Patient informed of what they had received and tolerated injection well. Patient notified to reach out to office if needed.

## 2023-05-13 ENCOUNTER — Encounter: Payer: Self-pay | Admitting: Neurology

## 2023-05-13 ENCOUNTER — Ambulatory Visit: Payer: 59 | Admitting: Neurology

## 2023-05-13 VITALS — BP 144/74 | HR 78 | Ht 67.0 in | Wt 238.0 lb

## 2023-05-13 DIAGNOSIS — R200 Anesthesia of skin: Secondary | ICD-10-CM

## 2023-05-13 DIAGNOSIS — R292 Abnormal reflex: Secondary | ICD-10-CM

## 2023-05-13 NOTE — Patient Instructions (Signed)
Ultrasound to check circulation of the left leg

## 2023-05-13 NOTE — Progress Notes (Signed)
 Follow-up Visit   Date: 05/13/2023    REMELL GIAIMO MRN: [id number] DOB: 07-Apr-1971    Hannah Cain is a 53 y.o. right-handed Caucasian female returning to the clinic for follow-up of left leg numbness.  The patient was accompanied to the clinic by self.  IMPRESSION/PLAN: Left lower leg numbness and foot paresthesias.  MRI lumbar spine does not show any nerve impingement.  NCS/EMG is normal without evidence of neuropathy or peroneal neuropathy.   Discussed further testing to look at circulation the legs to include ABI, given her exertional symptoms.  If this is normal,  consider MRI of the cervical and thoracic spine (exam with hyperreflexia).  Further recommendations pending results.  --------------------------------------------- History of present illness: She has numbness involving the left foot and lateral leg, which has been getting worse since April 2024.  There was no preceding injury.   Symptoms are constant in the lower leg and intermittent in the foot.  Symptoms are not worse with activity.  She denies low back pain or radicular leg pain. She has some weakness in the left leg.  She has completed PT with some benefit.     UPDATE 05/13/2023:  She is here for follow-up visit.  She continues to have numbness over the lateral lower leg which is constant.   She has pins and needles over the side of the left foot, which can occur once per day.  Tingling tends to start in the ankle and radiates up the leg. MRI lumbar spine and NCS/EMG has been normal. No associated weakness.   Medications:  Current Outpatient Medications on File Prior to Visit  Medication Sig Dispense Refill   Cholecalciferol (VITAMIN D3) 125 MCG (5000 UT) CAPS Take 1 capsule (5,000 Units total) by mouth daily. 30 capsule 0   Cyanocobalamin  (VITAMIN B-12 IJ) Inject as directed.     EPIPEN  2-PAK 0.3 MG/0.3ML SOAJ injection as directed Injection as directed for 30 days     Fexofenadine HCl (ALLEGRA ALLERGY PO) Take by  mouth.     Lifitegrast (XIIDRA) 5 % SOLN Apply to eye.     Multiple Vitamin (MULTIVITAMIN) tablet Take 1 tablet by mouth daily.     norethindrone (MICRONOR) 0.35 MG tablet Take 1 tablet by mouth daily.     Olopatadine HCl 0.6 % SOLN Place 2 sprays into both nostrils 2 (two) times daily.     gabapentin  (NEURONTIN ) 100 MG capsule Take 1 tablet at bedtime x 1 week, then increase to 1 tablet twice daily. (Patient not taking: Reported on 05/13/2023) 60 capsule 3   No current facility-administered medications on file prior to visit.    Allergies: No Known Allergies  Vital Signs:  BP (!) 144/74   Pulse 78   Ht 5' 7 (1.702 m)   Wt 238 lb (108 kg)   SpO2 99%   BMI 37.28 kg/m     Neurological Exam: MENTAL STATUS including orientation to time, place, person, recent and remote memory, attention span and concentration, language, and fund of knowledge is normal.  Speech is not dysarthric.  CRANIAL NERVES:  Pupils equal round and reactive to light.  Normal conjugate, extra-ocular eye movements in all directions of gaze.  No ptosis.  Face is symmetric.   MOTOR:  Motor strength is 5/5 in all extremities, including door dorsiflexion and toe extension.  No atrophy, fasciculations or abnormal movements.  No pronator drift.  Tone is normal.    MSRs:  Reflexes are 2+/4 throughout, except 3+/4 at  the knees.  SENSORY:  Intact to vibration, pin prick, and temperature.  COORDINATION/GAIT:  Normal finger-to- nose-finger.  Intact rapid alternating movements bilaterally.  Gait narrow based and stable.   Data: NCS/EMG of the left leg 05/02/2023: This is a normal study of the left lower extremity.  In particular, there is no evidence of a peroneal mononeuropathy, lumbosacral radiculopathy, or large fiber sensorimotor polyneuropathy.   MRI lumbar spine 03/26/2023: T12-L1: No significant disc bulge. No neural foraminal stenosis. No central canal stenosis.   L1-L2: Mild broad-based disc bulge. Mild bilateral  facet arthropathy. No foraminal or central canal stenosis.   L2-L3: Mild disc bulge. Mild bilateral facet arthropathy. No right foraminal stenosis. Mild left foraminal stenosis. No central canal stenosis.   L3-L4: Mild broad-based disc bulge. Moderate bilateral facet arthropathy. No central canal stenosis. Mild left foraminal stenosis. No right foraminal stenosis.   L4-L5: Mild disc bulge. Mild bilateral facet arthropathy. No foraminal or central canal stenosis.   L5-S1: Mild disc bulge with a small central disc protrusion. Moderate bilateral facet arthropathy. Mild bilateral foraminal stenosis. No central canal stenosis.   IMPRESSION: 1. Mild lumbar spine spondylosis as described above. 2. No acute osseous injury of the lumbar spine.    Thank you for allowing me to participate in patient's care.  If I can answer any additional questions, I would be pleased to do so.    Sincerely,    Paarth Cropper K. Tobie, DO

## 2023-05-14 ENCOUNTER — Encounter (INDEPENDENT_AMBULATORY_CARE_PROVIDER_SITE_OTHER): Payer: Self-pay | Admitting: Physician Assistant

## 2023-05-14 ENCOUNTER — Ambulatory Visit (INDEPENDENT_AMBULATORY_CARE_PROVIDER_SITE_OTHER): Payer: 59 | Admitting: Physician Assistant

## 2023-05-14 VITALS — BP 130/76 | HR 72 | Temp 98.7°F | Ht 67.0 in | Wt 234.0 lb

## 2023-05-14 DIAGNOSIS — M2550 Pain in unspecified joint: Secondary | ICD-10-CM

## 2023-05-14 DIAGNOSIS — M2559 Pain in other specified joint: Secondary | ICD-10-CM

## 2023-05-14 DIAGNOSIS — M25571 Pain in right ankle and joints of right foot: Secondary | ICD-10-CM

## 2023-05-14 DIAGNOSIS — M25561 Pain in right knee: Secondary | ICD-10-CM

## 2023-05-14 DIAGNOSIS — E669 Obesity, unspecified: Secondary | ICD-10-CM

## 2023-05-14 DIAGNOSIS — M79605 Pain in left leg: Secondary | ICD-10-CM | POA: Diagnosis not present

## 2023-05-14 DIAGNOSIS — E88819 Insulin resistance, unspecified: Secondary | ICD-10-CM | POA: Diagnosis not present

## 2023-05-14 DIAGNOSIS — M25562 Pain in left knee: Secondary | ICD-10-CM

## 2023-05-14 DIAGNOSIS — M25572 Pain in left ankle and joints of left foot: Secondary | ICD-10-CM

## 2023-05-14 DIAGNOSIS — Z6836 Body mass index (BMI) 36.0-36.9, adult: Secondary | ICD-10-CM

## 2023-05-14 DIAGNOSIS — E559 Vitamin D deficiency, unspecified: Secondary | ICD-10-CM

## 2023-05-14 DIAGNOSIS — E538 Deficiency of other specified B group vitamins: Secondary | ICD-10-CM

## 2023-05-14 NOTE — Progress Notes (Signed)
 SUBJECTIVE: Discussed the use of AI scribe software for clinical note transcription with the patient, who gave verbal consent to proceed.  Chief Complaint: Obesity  Interim History: She is up 10 lbs since her last visit 02/20/23.  Hannah Cain is a 53 year old female who presents for follow-up of her obesity treatment plan.  She is experiencing challenges with her weight management plan, primarily due to decreased physical activity resulting from leg pain. Her current higher calorie diet may not be suitable given her reduced activity level. She engages in comfort eating, particularly during the holidays, and has adjusted her diet by reducing portion sizes, especially protein intake. . She maintains hydration with a significant amount of water daily and includes fruits like grapes, strawberries, blueberries, and bananas in her diet. She has tried psyllium husk capsules for fiber but has experienced inconsistent results regarding constipation.  She experiences severe leg pain, with numbness around her foot and ankle, severe enough to cause tears. She has undergone a nerve test and imaging studies, including an MRI, which showed no issues with her lower back. However, she experiences pain in her upper back. She recently consulted with a neurologist who suggested checking the circulation in her leg. She has tried shoe inserts, which provided some relief, allowing her to walk for about 20 minutes on two occasions last week, although on one occasion she could not walk for more than five minutes due to severe pain. Gabapentin  was prescribed by her neurologist but she has not started it due to concerns about potential side effects, particularly regarding memory.  She has a history of joint pain in her knees, ankles, and shoulder, which began approximately a year ago. Initial tests by a sports medicine specialist indicated elevated autoimmune markers, leading to a referral to a rheumatologist. Further blood  work by the rheumatologist did not yield definitive results, and she was subsequently referred to a neurologist. She has tried meloxicam , which helped with joint pain but not with the numbness or redness. Physical therapy has also provided some relief for her joints. She is scheduled to see her rheumatologist again in March for further evaluation.  She has a history of vitamin D  and B12 deficiencies, which are being managed. There is no indication that these deficiencies are contributing to her current symptoms. Hannah Cain is here to discuss her progress with her obesity treatment plan. She is on the Category 3 Plan and states she is following her eating plan approximately 30 % of the time. She states she is exercising walking 20 minutes 2 times per week.   OBJECTIVE: Visit Diagnoses: Problem List Items Addressed This Visit     Vitamin D  deficiency   Generalized obesity with inital BMI 36   Vitamin B 12 deficiency   Insulin  resistance - Primary   Other Visit Diagnoses       Left leg pain         Arthralgia, multiple joints         BMI 36.0-36.9,adult Current BMI 36.7          Obesity Hannah Cain, a 53 year old female, has obesity. Leg pain and reduced physical activity have been challenges. Reports comfort eating and adjusted food intake due to decreased exercise. Recommended dietary adjustment to Category 1, focusing on reduced portions, particularly protein at dinner, and fewer snack calories. Discussed benefits of weight loss on reducing inflammation and improving joint pain. - Adjust diet to Category 1 - Focus on protein intake and hydration - Monitor weight  and symptoms - Follow up in six weeks    Insulin  Resistance Last fasting insulin  was 9.6- not at goal. A1c was 5.6- at goal. Polyphagia:Yes Medication(s): None Lab Results  Component Value Date   HGBA1C 5.6 01/31/2023   HGBA1C 5.4 09/04/2022   HGBA1C 5.4 07/03/2022   Lab Results  Component Value Date   INSULIN  9.6  01/31/2023   INSULIN  10.3 09/04/2022    Plan:  Continue working on nutrition plan to decrease simple carbohydrates, increase lean proteins and exercise to promote weight loss, improve glycemic control and prevent progression to Type 2 diabetes.  May benefit from metformin  at some point for insulin  resistance. Struggling to maintain and focus on her plan at this point and hesitant to try medications.  Have adjusted nutrition plan to Category 1 based on last indirect calorimetry done 02/20/23 at REE of 1642 to promote weight loss as suspect metabolism may have decreased further with recent limitations with activity levels.     Chronic Leg Pain Jeanie reports severe leg pain with numbness around her foot and ankle. Nerve test and MRI showed no issues with her lower back. Neurologist plans to check leg circulation and may consider MRI of the neck and upper back. Meloxicam  provided some relief for joint pain but not leg pain. Gabapentin  was prescribed, but she is hesitant due to concerns about long-term memory effects. Discussed that gabapentin  is started at a low dose and can help with rest and discomfort, and it can be discontinued if not effective or if side effects occur. - Check leg circulation - Consider MRI of neck and upper back if needed - Encourage trial of low-dose gabapentin  - Follow up with neurologist as needed  Joint Pain Hannah Cain has joint pain in her knees, ankles, and shoulder. Sports medicine specialist and rheumatologist found elevated autoimmune markers but no definitive diagnosis. Meloxicam  and physical therapy provided some relief. Discussed potential need for more potent anti-inflammatory medication, pending rheumatologist's evaluation. - Follow up with rheumatologist in March - Consider more potent anti-inflammatory medication based on rheumatologist's recommendations - Continue physical therapy as tolerated   Vitamin D  and B12 Deficiencies Hannah Cain has vitamin D  and B12  deficiencies, not contributing to current symptoms. - Continue current supplementation for vitamin D  and B12  General Health Maintenance Hannah Cain advised to focus on hydration and fiber intake. Drinks sufficient water and consumes fruits for fiber. Advised to consider psyllium husk capsules for additional fiber. - Ensure adequate hydration - Consider psyllium husk capsules for additional fiber - Maintain fruit intake for fiber  Follow-up - Follow up on March 18th at 7:30 AM.  Vitals Temp: 98.7 F (37.1 C) BP: 130/76 Pulse Rate: 72 SpO2: 100 %   Anthropometric Measurements Height: 5' 7 (1.702 m) Weight: 234 lb (106.1 kg) BMI (Calculated): 36.64 Weight at Last Visit: 224 lb Weight Lost Since Last Visit: 0 Weight Gained Since Last Visit: 10 lb Starting Weight: 228 lb Total Weight Loss (lbs): 0 lb (0 kg)   Body Composition  Body Fat %: 45.4 % Fat Mass (lbs): 106.4 lbs Muscle Mass (lbs): 121.4 lbs Total Body Water (lbs): 85.2 lbs Visceral Fat Rating : 12   Other Clinical Data Fasting: no Labs: no Today's Visit #: 9 Starting Date: 09/04/22     ASSESSMENT AND PLAN:  Diet: Joene is currently in the action stage of change. As such, her goal is to get back to weight loss efforts. She has agreed to Category 1 Plan.  Exercise: Conleigh has been instructed  exercise as able with leg and joint pains/consider a pool program  for weight loss and overall health benefits.   Behavior Modification:  We discussed the following Behavioral Modification Strategies today: increasing lean protein intake, decreasing simple carbohydrates, increasing vegetables, increase H2O intake, increase high fiber foods, meal planning and cooking strategies, better snacking choices, avoiding temptations, and planning for success. We discussed various medication options to help Xandra with her weight loss efforts and we both agreed to continue to work on nutritional and behavioral strategies to  promote weight loss.  .  Return in about 6 weeks (around 06/25/2023).SABRA She was informed of the importance of frequent follow up visits to maximize her success with intensive lifestyle modifications for her multiple health conditions.  Attestation Statements:   Reviewed by clinician on day of visit: allergies, medications, problem list, medical history, surgical history, family history, social history, and previous encounter notes.   Time spent on visit including pre-visit chart review and post-visit care and charting was 32 minutes.    Hayslee Casebolt, PA-C

## 2023-05-16 ENCOUNTER — Ambulatory Visit
Admission: RE | Admit: 2023-05-16 | Discharge: 2023-05-16 | Disposition: A | Discharge status: Home / Self Care | Payer: 59 | Source: Ambulatory Visit | Attending: Neurology | Admitting: Neurology

## 2023-05-16 DIAGNOSIS — R200 Anesthesia of skin: Secondary | ICD-10-CM

## 2023-05-16 DIAGNOSIS — R292 Abnormal reflex: Secondary | ICD-10-CM

## 2023-05-21 ENCOUNTER — Encounter: Payer: Self-pay | Admitting: Neurology

## 2023-05-21 ENCOUNTER — Other Ambulatory Visit: Payer: Self-pay

## 2023-05-21 ENCOUNTER — Encounter: Payer: Self-pay | Admitting: Internal Medicine

## 2023-05-21 DIAGNOSIS — R292 Abnormal reflex: Secondary | ICD-10-CM

## 2023-05-21 DIAGNOSIS — M5416 Radiculopathy, lumbar region: Secondary | ICD-10-CM

## 2023-05-21 DIAGNOSIS — R202 Paresthesia of skin: Secondary | ICD-10-CM

## 2023-05-21 DIAGNOSIS — R200 Anesthesia of skin: Secondary | ICD-10-CM

## 2023-05-21 DIAGNOSIS — R350 Frequency of micturition: Secondary | ICD-10-CM

## 2023-05-22 ENCOUNTER — Other Ambulatory Visit: Payer: 59

## 2023-05-22 DIAGNOSIS — R350 Frequency of micturition: Secondary | ICD-10-CM

## 2023-05-23 ENCOUNTER — Other Ambulatory Visit: Payer: Self-pay | Admitting: Obstetrics

## 2023-05-23 ENCOUNTER — Encounter: Payer: Self-pay | Admitting: Internal Medicine

## 2023-05-23 DIAGNOSIS — Z1231 Encounter for screening mammogram for malignant neoplasm of breast: Secondary | ICD-10-CM

## 2023-05-23 LAB — URINE CULTURE: Result:: NO GROWTH

## 2023-05-24 ENCOUNTER — Ambulatory Visit
Admission: RE | Admit: 2023-05-24 | Discharge: 2023-05-24 | Disposition: A | Discharge status: Home / Self Care | Payer: 59 | Source: Ambulatory Visit | Attending: Neurology | Admitting: Neurology

## 2023-05-24 DIAGNOSIS — M5416 Radiculopathy, lumbar region: Secondary | ICD-10-CM

## 2023-05-24 DIAGNOSIS — R202 Paresthesia of skin: Secondary | ICD-10-CM

## 2023-05-24 DIAGNOSIS — R292 Abnormal reflex: Secondary | ICD-10-CM

## 2023-05-24 DIAGNOSIS — R200 Anesthesia of skin: Secondary | ICD-10-CM

## 2023-06-05 ENCOUNTER — Ambulatory Visit: Payer: 59

## 2023-06-06 ENCOUNTER — Ambulatory Visit (INDEPENDENT_AMBULATORY_CARE_PROVIDER_SITE_OTHER): Payer: 59

## 2023-06-06 DIAGNOSIS — E538 Deficiency of other specified B group vitamins: Secondary | ICD-10-CM

## 2023-06-06 MED ORDER — CYANOCOBALAMIN 1000 MCG/ML IJ SOLN
1000.0000 ug | Freq: Once | INTRAMUSCULAR | Status: AC
  Administered 2023-06-06: 1000 ug via INTRAMUSCULAR

## 2023-06-06 NOTE — Progress Notes (Signed)
 Patient visits today to receive her B12 injection/vaccine. Patient was informed and tolerated well. Patient was notified to reach out to [name] if needed.

## 2023-06-09 ENCOUNTER — Encounter: Payer: Self-pay | Admitting: Internal Medicine

## 2023-06-09 DIAGNOSIS — E559 Vitamin D deficiency, unspecified: Secondary | ICD-10-CM

## 2023-06-09 DIAGNOSIS — R739 Hyperglycemia, unspecified: Secondary | ICD-10-CM

## 2023-06-09 DIAGNOSIS — I10 Essential (primary) hypertension: Secondary | ICD-10-CM

## 2023-06-12 ENCOUNTER — Telehealth: Payer: Self-pay | Admitting: Neurology

## 2023-06-12 NOTE — Telephone Encounter (Signed)
 Pt called in stating she wasn't sure if she needs to sch a f/u with patel to go over her MRI results. She has not heard from anyone so she is checking in with  [name]

## 2023-06-18 ENCOUNTER — Ambulatory Visit (INDEPENDENT_AMBULATORY_CARE_PROVIDER_SITE_OTHER): Admitting: Neurology

## 2023-06-18 VITALS — BP 150/83 | HR 86 | Ht 67.0 in | Wt 242.0 lb

## 2023-06-18 DIAGNOSIS — R200 Anesthesia of skin: Secondary | ICD-10-CM | POA: Diagnosis not present

## 2023-06-18 DIAGNOSIS — M5124 Other intervertebral disc displacement, thoracic region: Secondary | ICD-10-CM

## 2023-06-18 NOTE — Progress Notes (Signed)
 Follow-up Visit   Date: 06/18/2023    AIVA MISKELL MRN: [id number] DOB: March 23, 1971    Hannah Cain is a 53 y.o. right-handed Caucasian female returning to the clinic for follow-up of left leg numbness.  The patient was accompanied to the clinic by self.  IMPRESSION/PLAN: Left lower leg numbness and foot numbness.  She has underwent extensive testing and most recent MRI thoracic spine shows left paracentral disc herniation at T7-8 causing flattening of the cord.  MRI lumbar spine without nerve impingement.  NCS/EMG of the leg and ABI are both normal.  I recommend physical therapy for back strengthening and stretching. OK to stay off gabapentin, unless painful paresthesias become constant  Return to clinic in 3 months  --------------------------------------------- History of present illness: She has numbness involving the left foot and lateral leg, which has been getting worse since April 2024.  There was no preceding injury.   Symptoms are constant in the lower leg and intermittent in the foot.  Symptoms are not worse with activity.  She denies low back pain or radicular leg pain. She has some weakness in the left leg.  She has completed PT with some benefit.     UPDATE 05/13/2023:  She is here for follow-up visit.  She continues to have numbness over the lateral lower leg which is constant.   She has pins and needles over the side of the left foot, which can occur once per day.  Tingling tends to start in the ankle and radiates up the leg. MRI lumbar spine and NCS/EMG has been normal. No associated weakness.   UPDATE 06/18/2023:  She reports less pins and needles sensation, which now occurs a few times over the past few weeks.  She continues to have numbness.  She is here to review MRI cervical spine and thoracic spine results.   Medications:  Current Outpatient Medications on File Prior to Visit  Medication Sig Dispense Refill   Cholecalciferol (VITAMIN D3) 125 MCG (5000 UT) CAPS  Take 1 capsule (5,000 Units total) by mouth daily. 30 capsule 0   EPIPEN 2-PAK 0.3 MG/0.3ML SOAJ injection as directed Injection as directed for 30 days     Fexofenadine HCl (ALLEGRA ALLERGY PO) Take by mouth.     gabapentin (NEURONTIN) 100 MG capsule Take 1 tablet at bedtime x 1 week, then increase to 1 tablet twice daily. 60 capsule 3   Lifitegrast (XIIDRA) 5 % SOLN Apply to eye.     Multiple Vitamin (MULTIVITAMIN) tablet Take 1 tablet by mouth daily.     norethindrone (MICRONOR) 0.35 MG tablet Take 1 tablet by mouth daily.     RESTASIS 0.05 % ophthalmic emulsion 1 drop 2 (two) times daily.     No current facility-administered medications on file prior to visit.    Allergies: No Known Allergies  Vital Signs:  BP (!) 150/83   Pulse 86   Ht 5\' 7"  (1.702 m)   Wt 242 lb (109.8 kg)   SpO2 99%   BMI 37.90 kg/m     Neurological Exam: MENTAL STATUS including orientation to time, place, person, recent and remote memory, attention span and concentration, language, and fund of knowledge is normal.  Speech is not dysarthric.  CRANIAL NERVES:  Pupils equal round and reactive to light.  Normal conjugate, extra-ocular eye movements in all directions of gaze.  No ptosis.  Face is symmetric.   MOTOR:  Motor strength is 5/5 in all extremities, including door dorsiflexion and toe  extension.  No atrophy, fasciculations or abnormal movements.  No pronator drift.  Tone is normal.    MSRs:  Reflexes are 2+/4 throughout, except 3+/4 at the knees.  SENSORY:  Intact to vibration, pin prick, and temperature.  COORDINATION/GAIT:  Normal finger-to- nose-finger.  Intact rapid alternating movements bilaterally.  Gait narrow based and stable.   Data: NCS/EMG of the left leg 05/02/2023: This is a normal study of the left lower extremity.  In particular, there is no evidence of a peroneal mononeuropathy, lumbosacral radiculopathy, or large fiber sensorimotor polyneuropathy.   MRI lumbar spine  03/26/2023: T12-L1: No significant disc bulge. No neural foraminal stenosis. No central canal stenosis.   L1-L2: Mild broad-based disc bulge. Mild bilateral facet arthropathy. No foraminal or central canal stenosis.   L2-L3: Mild disc bulge. Mild bilateral facet arthropathy. No right foraminal stenosis. Mild left foraminal stenosis. No central canal stenosis.   L3-L4: Mild broad-based disc bulge. Moderate bilateral facet arthropathy. No central canal stenosis. Mild left foraminal stenosis. No right foraminal stenosis.   L4-L5: Mild disc bulge. Mild bilateral facet arthropathy. No foraminal or central canal stenosis.   L5-S1: Mild disc bulge with a small central disc protrusion. Moderate bilateral facet arthropathy. Mild bilateral foraminal stenosis. No central canal stenosis.   IMPRESSION: 1. Mild lumbar spine spondylosis as described above. 2. No acute osseous injury of the lumbar spine.  MRI thoracic spine wo contrast 05/24/2023: Small central, left paracentral disc herniation T7-T8 flattening the left lateral aspect of the cord.   MRI cervical spine wo contrast 05/24/2023: Mild degenerative disc disease at C4-C5 and C5-C6 with small bulging discs without significant central canal or neural foraminal narrowing.  ABI 05/16/2023:  Normal     Thank you for allowing me to participate in patient's care.  If I can answer any additional questions, I would be pleased to do so.    Sincerely,    Colby Reels K. Allena Katz, DO

## 2023-06-23 ENCOUNTER — Telehealth: Payer: Self-pay | Admitting: Neurology

## 2023-06-23 ENCOUNTER — Encounter: Payer: Self-pay | Admitting: Neurology

## 2023-06-23 NOTE — Progress Notes (Unsigned)
 SUBJECTIVE:  Chief Complaint: Obesity  Interim History: She is up 1 lb since last visit.   Hannah Cain is here to discuss her progress with her obesity treatment plan. She is on the Category 1 Plan and states she is following her eating plan approximately 50 % of the time. She states she is exercising 20 minutes 2 times per week.  Hannah Cain is a 53 year old female who presents for follow-up of her obesity treatment plan.  She has a history of insulin resistance, vitamin D deficiency, and B12 deficiency. She has switched to a category one plan for obesity management. She has not taken metformin before, although it was discussed as a potential treatment for her prediabetes. She experiences cravings for sweets, which is unusual for her.  She recently visited a rheumatologist and had an MRI of her back. The ANA, which was previously positive, is now negative, and no autoimmune conditions were identified. However, a disc in her mid-back is pushing against the spinal cord, causing pain. She is starting physical therapy to address this issue. The back pain affects her daily activities, particularly walking, twisting, bending, and reaching above her head. She has adapted her activities at work, such as avoiding sitting in small chairs and taking breaks from prolonged sitting.  She is not currently taking gabapentin, as she decided against starting it due to concerns about long-term side effects. She manages her pain without it, although there are days when the pain is severe. She is on vitamin D and B12 injections.  She experiences night sweats and is uncertain about her menopausal status due to being on Micronor for several years, which has stopped her periods.  OBJECTIVE: Visit Diagnoses: Problem List Items Addressed This Visit     Vitamin D deficiency   Generalized obesity with inital BMI 36   Relevant Medications   metFORMIN (GLUCOPHAGE) 500 MG tablet   Vitamin B 12 deficiency   Insulin  resistance - Primary   Relevant Medications   metFORMIN (GLUCOPHAGE) 500 MG tablet   Other Visit Diagnoses       Menopausal symptoms         BMI 36.0-36.9,adult Current BMI 36.9          Obesity Obesity management is ongoing with insulin resistance, vitamin D and B12 deficiency. Back pain hinders exercise, affecting weight management. Metformin was discussed for its modest weight loss benefits, potential to reduce hunger, and anti-inflammatory effects, with an expected weight loss of 8 to 12 pounds. She is willing to try metformin. - Start metformin once daily with food, consider starting on a weekend to monitor for gastrointestinal side effects - Increase metformin to twice daily if well tolerated - Discuss potential side effects of metformin, including gastrointestinal upset if taken on an empty stomach - Monitor weight and symptoms of inflammation  Insulin Resistance Insulin resistance contributes to obesity and metabolic issues. Metformin was discussed to improve insulin sensitivity, stabilize insulin levels, and reduce hunger. - Start metformin 500 mg once daily with food Continue working on nutrition plan to decrease simple carbohydrates, increase lean proteins and exercise to promote weight loss, improve glycemic control and prevent progression to Type 2 diabetes.  Meds ordered this encounter  Medications   metFORMIN (GLUCOPHAGE) 500 MG tablet    Sig: Take 1 tablet (500 mg total) by mouth daily.    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 Physical therapy is recommended, and she is starting sessions. Following with Dr. Nita Sickle Neurology.  MRI of thoracic spine 05/24/23 IMPRESSION: Small central, left paracentral disc herniation T7-T8 flattening the left lateral aspect of the cord. - Continue with physical therapy - Consider ergonomic adjustments at work,  such as a standing desk - Explore access to a heated pool for low-impact exercise  Vitamin D and B12 Deficiency She is on vitamin D supplementation and B12 injections. Metformin should not affect B12 absorption since she receives it via injection. - Continue vitamin D supplementation - Continue B12 injections  Perimenopausal Symptoms Severe night sweats and uncertainty about menopausal status due to Micronor use for the past few years due to severity of menses.  Hormone therapy was suggested for discussion with her gynecologist, including an possibly an estradiol patch for joint aches, night sweats, and overall well-being. - Discuss hormone therapy options with gynecologist, including estradiol patch - Consider discontinuing Micronor to assess menopausal status  Follow-up Follow-up in one month to assess metformin effectiveness and physical therapy impact on back pain. - Schedule follow-up appointment in one month - Contact provider if experiencing issues with metformin Vitals Temp: 98.3 F (36.8 C) BP: 128/77 Pulse Rate: 69 SpO2: 98 %   Anthropometric Measurements Height: 5\' 7"  (1.702 m) Weight: 235 lb (106.6 kg) BMI (Calculated): 36.8 Weight at Last Visit: 234 lb Weight Lost Since Last Visit: 0 Weight Gained Since Last Visit: 1 lb Starting Weight: 228 lb Total Weight Loss (lbs): 0 lb (0 kg)   Body Composition  Body Fat %: 45.2 % Fat Mass (lbs): 106.6 lbs Muscle Mass (lbs): 122.6 lbs Total Body Water (lbs): 84 lbs Visceral Fat Rating : 13   Other Clinical Data Fasting: yes Labs: no Today's Visit #: 10 Starting Date: 09/04/22     ASSESSMENT AND PLAN:  Diet: Hannah Cain is currently in the action stage of change. As such, her goal is to continue with weight loss efforts and has agreed to the Category 1 Plan.   Exercise:  All adults should avoid inactivity. Some activity is better than none, and adults who participate in any amount of physical activity, gain some  health benefits.  Behavior Modification:  We discussed the following Behavioral Modification Strategies today: increasing lean protein intake, decreasing simple carbohydrates, increasing vegetables, increase H2O intake, increase high fiber foods, better snacking choices, emotional eating strategies , avoiding temptations, and planning for success. We discussed various medication options to help Hannah Cain with her weight loss efforts and we both agreed to start metformin for off label use for insulin resistance and continue to work on nutritional and behavioral strategies to promote weight loss.  .  Return in about 4 weeks (around 07/22/2023).Marland Kitchen She was informed of the importance of frequent follow up visits to maximize her success with intensive lifestyle modifications for her multiple health conditions.  Attestation Statements:   Reviewed by clinician on day of visit: allergies, medications, problem list, medical history, surgical history, family history, social history, and previous encounter notes.   Time spent on visit including pre-visit chart review and post-visit care and charting was 33 minutes  Jaelon Gatley,PA-C

## 2023-06-23 NOTE — Telephone Encounter (Signed)
 See My chart message

## 2023-06-23 NOTE — Telephone Encounter (Signed)
 Pt called and LM. Dr.patel sent a referral to [facility] physical therapy and they dont take ins or take her insurance. She wants Dr.patel to send a new referral to [facility] in [place]. She wants a return call to speak with someone about this as well

## 2023-06-24 ENCOUNTER — Ambulatory Visit (INDEPENDENT_AMBULATORY_CARE_PROVIDER_SITE_OTHER): Payer: 59 | Admitting: Physician Assistant

## 2023-06-24 ENCOUNTER — Encounter (INDEPENDENT_AMBULATORY_CARE_PROVIDER_SITE_OTHER): Payer: Self-pay | Admitting: Physician Assistant

## 2023-06-24 VITALS — BP 128/77 | HR 69 | Temp 98.3°F | Ht 67.0 in | Wt 235.0 lb

## 2023-06-24 DIAGNOSIS — Z6836 Body mass index (BMI) 36.0-36.9, adult: Secondary | ICD-10-CM

## 2023-06-24 DIAGNOSIS — E538 Deficiency of other specified B group vitamins: Secondary | ICD-10-CM

## 2023-06-24 DIAGNOSIS — E559 Vitamin D deficiency, unspecified: Secondary | ICD-10-CM | POA: Diagnosis not present

## 2023-06-24 DIAGNOSIS — N951 Menopausal and female climacteric states: Secondary | ICD-10-CM

## 2023-06-24 DIAGNOSIS — E669 Obesity, unspecified: Secondary | ICD-10-CM

## 2023-06-24 DIAGNOSIS — E88819 Insulin resistance, unspecified: Secondary | ICD-10-CM

## 2023-06-24 MED ORDER — METFORMIN HCL 500 MG PO TABS: 500.0000 mg | ORAL_TABLET | Freq: Every day | ORAL | 0 refills | Status: DC

## 2023-06-26 ENCOUNTER — Other Ambulatory Visit: Payer: Self-pay

## 2023-06-26 DIAGNOSIS — M5416 Radiculopathy, lumbar region: Secondary | ICD-10-CM

## 2023-06-26 DIAGNOSIS — R200 Anesthesia of skin: Secondary | ICD-10-CM

## 2023-06-26 DIAGNOSIS — M5124 Other intervertebral disc displacement, thoracic region: Secondary | ICD-10-CM

## 2023-06-26 DIAGNOSIS — R202 Paresthesia of skin: Secondary | ICD-10-CM

## 2023-07-03 NOTE — Progress Notes (Unsigned)
    Aleen Sells D.[facility] [address] [place] [address] Phone: [phone]   Assessment and Plan:     There are no diagnoses linked to this encounter.  ***   Pertinent previous records reviewed include ***    Follow Up: ***     Subjective:   I, Addaleigh Nicholls, am serving as a Neurosurgeon for Doctor Richardean Sale   Chief Complaint: left foot pain    HPI:    07/15/22 Patient is a 53 year old female complaining of left foot pain. Patient states that all her joints are hurting, lateral heel pain that radiates through the front of her calf, intermittent pain when walking so bad that she has to sit down, antalgic gait , she will get anterior foot pain , she has doubled her vitamin D, does note some numbness through her leg not much in her foot , tylenol for the pain and advil PM , the pain will keep her up at night ,   07/04/2023 Patient states  Relevant Historical Information: Hypertension  Additional pertinent review of systems negative.   Current Outpatient Medications:    Cholecalciferol (VITAMIN D3) 125 MCG (5000 UT) CAPS, Take 1 capsule (5,000 Units total) by mouth daily., Disp: 30 capsule, Rfl: 0   EPIPEN 2-PAK 0.3 MG/0.3ML SOAJ injection, as directed Injection as directed for 30 days, Disp: , Rfl:    Fexofenadine HCl (ALLEGRA ALLERGY PO), Take by mouth., Disp: , Rfl:    gabapentin (NEURONTIN) 100 MG capsule, Take 1 tablet at bedtime x 1 week, then increase to 1 tablet twice daily. (Patient not taking: Reported on 06/24/2023), Disp: 60 capsule, Rfl: 3   Lifitegrast (XIIDRA) 5 % SOLN, Apply to eye. (Patient not taking: Reported on 06/24/2023), Disp: , Rfl:    metFORMIN (GLUCOPHAGE) 500 MG tablet, Take 1 tablet (500 mg total) by mouth daily., Disp: 30 tablet, Rfl: 0   Multiple Vitamin (MULTIVITAMIN) tablet, Take 1 tablet by mouth daily., Disp: , Rfl:    norethindrone (MICRONOR) 0.35 MG tablet, Take 1 tablet by mouth daily., Disp: ,  Rfl:    RESTASIS 0.05 % ophthalmic emulsion, 1 drop 2 (two) times daily., Disp: , Rfl:    Objective:     There were no vitals filed for this visit.    There is no height or weight on file to calculate BMI.    Physical Exam:    ***   Electronically signed by:  Aleen Sells D.[facility] 7:28 AM 07/03/23

## 2023-07-04 ENCOUNTER — Ambulatory Visit: Admitting: Sports Medicine

## 2023-07-04 VITALS — BP 122/70 | HR 87 | Ht 67.0 in | Wt 242.0 lb

## 2023-07-04 DIAGNOSIS — M25512 Pain in left shoulder: Secondary | ICD-10-CM | POA: Diagnosis not present

## 2023-07-04 DIAGNOSIS — G8929 Other chronic pain: Secondary | ICD-10-CM

## 2023-07-04 DIAGNOSIS — M25561 Pain in right knee: Secondary | ICD-10-CM | POA: Diagnosis not present

## 2023-07-04 DIAGNOSIS — R768 Other specified abnormal immunological findings in serum: Secondary | ICD-10-CM

## 2023-07-04 DIAGNOSIS — M2550 Pain in unspecified joint: Secondary | ICD-10-CM

## 2023-07-04 DIAGNOSIS — M542 Cervicalgia: Secondary | ICD-10-CM

## 2023-07-04 DIAGNOSIS — M25562 Pain in left knee: Secondary | ICD-10-CM

## 2023-07-04 MED ORDER — MELOXICAM 15 MG PO TABS: 15.0000 mg | ORAL_TABLET | Freq: Every day | ORAL | 0 refills | Status: DC

## 2023-07-04 NOTE — Patient Instructions (Addendum)
-   Start meloxicam 15 mg daily x3 weeks.  After 3 weeks use meloxicam daily as needed. Continue PT After finishing meloxicam course - Start Tylenol 500 to 1000 mg tablets 2-3 times a day for day-to-day pain relief  Follow up in 5 to 6 weeks.

## 2023-07-09 ENCOUNTER — Other Ambulatory Visit (INDEPENDENT_AMBULATORY_CARE_PROVIDER_SITE_OTHER): Payer: BC Managed Care – PPO

## 2023-07-09 DIAGNOSIS — I10 Essential (primary) hypertension: Secondary | ICD-10-CM | POA: Diagnosis not present

## 2023-07-09 DIAGNOSIS — E559 Vitamin D deficiency, unspecified: Secondary | ICD-10-CM

## 2023-07-09 DIAGNOSIS — R739 Hyperglycemia, unspecified: Secondary | ICD-10-CM | POA: Diagnosis not present

## 2023-07-09 LAB — LIPID PANEL
Cholesterol: 192 mg/dL (ref 0–200)
HDL: 60.8 mg/dL (ref >39.00)
LDL Cholesterol: 117 mg/dL — ABNORMAL HIGH (ref 0–99)
NonHDL: 131.49
Total CHOL/HDL Ratio: 3
Triglycerides: 71 mg/dL (ref 0.0–149.0)
VLDL: 14.2 mg/dL (ref 0.0–40.0)

## 2023-07-09 LAB — URINALYSIS, ROUTINE W REFLEX MICROSCOPIC
Bilirubin Urine: NEGATIVE
Hgb urine dipstick: NEGATIVE
Ketones, ur: NEGATIVE
Leukocytes,Ua: NEGATIVE
Nitrite: NEGATIVE
RBC / HPF: NONE SEEN (ref 0–?)
Specific Gravity, Urine: 1.025 (ref 1.000–1.030)
Total Protein, Urine: NEGATIVE
Urine Glucose: NEGATIVE
Urobilinogen, UA: 0.2 (ref 0.0–1.0)
WBC, UA: NONE SEEN (ref 0–?)
pH: 6 (ref 5.0–8.0)

## 2023-07-09 LAB — TSH: TSH: 3.36 u[IU]/mL (ref 0.35–5.50)

## 2023-07-09 LAB — HEMOGLOBIN A1C: Hgb A1c MFr Bld: 5.3 % (ref 4.6–6.5)

## 2023-07-09 LAB — VITAMIN D 25 HYDROXY (VIT D DEFICIENCY, FRACTURES): VITD: 44.51 ng/mL (ref 30.00–100.00)

## 2023-07-14 ENCOUNTER — Ambulatory Visit (INDEPENDENT_AMBULATORY_CARE_PROVIDER_SITE_OTHER): Payer: BC Managed Care – PPO | Admitting: Internal Medicine

## 2023-07-14 ENCOUNTER — Encounter: Payer: Self-pay | Admitting: Internal Medicine

## 2023-07-14 VITALS — BP 132/76 | HR 65 | Temp 98.8°F | Ht 67.0 in | Wt 244.0 lb

## 2023-07-14 DIAGNOSIS — E538 Deficiency of other specified B group vitamins: Secondary | ICD-10-CM

## 2023-07-14 DIAGNOSIS — Z0001 Encounter for general adult medical examination with abnormal findings: Secondary | ICD-10-CM

## 2023-07-14 DIAGNOSIS — R739 Hyperglycemia, unspecified: Secondary | ICD-10-CM | POA: Diagnosis not present

## 2023-07-14 DIAGNOSIS — E7800 Pure hypercholesterolemia, unspecified: Secondary | ICD-10-CM

## 2023-07-14 DIAGNOSIS — E559 Vitamin D deficiency, unspecified: Secondary | ICD-10-CM | POA: Diagnosis not present

## 2023-07-14 DIAGNOSIS — I10 Essential (primary) hypertension: Secondary | ICD-10-CM

## 2023-07-14 MED ORDER — CYANOCOBALAMIN 1000 MCG/ML IJ SOLN
1000.0000 ug | Freq: Once | INTRAMUSCULAR | Status: AC
Start: 2023-07-14 — End: 2023-07-14
  Administered 2023-07-14: 1000 ug via INTRAMUSCULAR

## 2023-07-14 MED ORDER — CYANOCOBALAMIN 1000 MCG/ML IJ SOLN: 1000.0000 ug | INTRAMUSCULAR | 3 refills | Status: DC

## 2023-07-14 NOTE — Patient Instructions (Addendum)
 Please have your Shingrix (shingles) shots done at your local pharmacy.  Ok for home B12 shots  Please continue all other medications as before, and refills have been done if requested.  Please have the pharmacy call with any other refills you may need.  Please continue your efforts at being more active, low cholesterol diet, and weight control.  You are otherwise up to date with prevention measures today.  Please keep your appointments with your specialists as you may have planned  Your lab work was very good today  Please make an Appointment to return for your 1 year visit, or sooner if needed, with Lab testing by Appointment as well, to be done about 3-5 days before at the FIRST FLOOR Lab (so this is for TWO appointments - please see the scheduling desk as you leave)

## 2023-07-14 NOTE — Progress Notes (Unsigned)
 Patient ID: Hannah Cain, female   DOB: [birth year], 53 y.o.   MRN: [id number]         Chief Complaint:: wellness exam and hld, low b12, low vit d, htn       HPI:  PHILANA YOUNIS is a 53 y.o. female here for wellness exam; for shingrix at pharmacy, to see GYN soon for pap, o/w up to date                        Also night sweats recently.  Has LLE numbness, saw neurology, now with PT and maybe somewhat improved with less chance of falling.  Also saw sport med for ankle and polyarthralgia with ANA + but other serologies neg, now being followed per rheum.  Stil has joint pain, takes mobic x 3 wks, to f/u with sports medicine soon  Pt denies chest pain, increased sob or doe, wheezing, orthopnea, PND, increased LE swelling, palpitations, dizziness or syncope.   Pt denies polydipsia, polyuria, or new focal neuro s/s.    Pt denies fever, wt loss, night sweats, loss of appetite, or other constitutional symptoms  Did have cardiac CT score zero 2024.  Pt states taking oral B12 always leads to nausea.  Asking for B12 shot.     Wt Readings from Last 3 Encounters:  07/14/23 244 lb (110.7 kg)  07/04/23 242 lb (109.8 kg)  06/24/23 235 lb (106.6 kg)   BP Readings from Last 3 Encounters:  07/14/23 132/76  07/04/23 122/70  06/24/23 128/77   Immunization History  Administered Date(s) Administered   Influenza Split 03/11/2011, 12/30/2019   Influenza, High Dose Seasonal PF 07/26/2019, 02/29/2020   Influenza, Seasonal, Injecte, Preservative Fre 02/28/2023   Influenza,inj,Quad PF,6+ Mos 12/31/2018   Influenza-Unspecified 01/05/2018, 01/19/2021   Moderna Sars-Covid-2 Vaccination 06/19/2019, 07/17/2019   PFIZER(Purple Top)SARS-COV-2 Vaccination 07/26/2019   Tdap 07/02/2018   Health Maintenance Due  Topic Date Due   Zoster Vaccines- Shingrix (1 of 2) Never done   Cervical Cancer Screening (HPV/Pap Cotest)  05/28/2022      Past Medical History:  Diagnosis Date   ALLERGIC RHINITIS 03/17/2007   Anemia     Arthritis    B12 deficiency    Back pain    Basal cell carcinoma (BCC) 06/10/2017   Constipation    GERD (gastroesophageal reflux disease)    High blood pressure    only for 6 months( due to birth control)   HLD (hyperlipidemia) 06/10/2017   Joint pain    Palpitations 11/12/2017   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 She has never used smokeless tobacco. She reports that she does not drink alcohol and does not use drugs. family history includes Breast cancer in her maternal grandmother; COPD in her maternal grandmother; Colon cancer (age of onset: 56) in her brother; Colon polyps in her maternal grandmother; Heart failure in her maternal grandmother; Hyperlipidemia in her maternal grandmother; Hypertension in her maternal grandmother and mother; Hypothyroidism in her mother; Liver cancer in her brother; Obesity in her father; Stroke in her maternal grandmother; Thyroid disease in her mother; Vitiligo in her mother. No Known Allergies Current Outpatient Medications on File Prior  to Visit  Medication Sig Dispense Refill   Cholecalciferol (VITAMIN D3) 125 MCG (5000 UT) CAPS Take 1 capsule (5,000 Units total) by mouth daily. 30 capsule 0   EPIPEN 2-PAK 0.3 MG/0.3ML SOAJ injection as directed Injection as directed for 30 days     Fexofenadine HCl (ALLEGRA ALLERGY PO) Take by mouth.     gabapentin (NEURONTIN) 100 MG capsule Take 1 tablet at bedtime x 1 week, then increase to 1 tablet twice daily. 60 capsule 3   Lifitegrast (XIIDRA) 5 % SOLN Apply to eye.     meloxicam (MOBIC) 15 MG tablet Take 1 tablet (15 mg total) by mouth daily. 30 tablet 0   metFORMIN (GLUCOPHAGE) 500 MG tablet Take 1 tablet (500 mg total) by mouth daily. 30 tablet 0   Multiple Vitamin  (MULTIVITAMIN) tablet Take 1 tablet by mouth daily.     norethindrone (MICRONOR) 0.35 MG tablet Take 1 tablet by mouth daily.     RESTASIS 0.05 % ophthalmic emulsion 1 drop 2 (two) times daily.     No current facility-administered medications on file prior to visit.        ROS:  All others reviewed and negative.  Objective        PE:  BP 132/76 (BP Location: Right Arm, Patient Position: Sitting, Cuff Size: Normal)   Pulse 65   Temp 98.8 F (37.1 C) (Oral)   Ht 5\' 7"  (1.702 m)   Wt 244 lb (110.7 kg)   SpO2 97%   BMI 38.22 kg/m                 Constitutional: Pt appears in NAD               HENT: Head: NCAT.                Right Ear: External ear normal.                 Left Ear: External ear normal.                Eyes: . Pupils are equal, round, and reactive to light. Conjunctivae and EOM are normal               Nose: without d/c or deformity               Neck: Neck supple. Gross normal ROM               Cardiovascular: Normal rate and regular rhythm.                 Pulmonary/Chest: Effort normal and breath sounds without rales or wheezing.                Abd:  Soft, NT, ND, + BS, no organomegaly               Neurological: Pt is alert. At baseline orientation, motor grossly intact               Skin: Skin is warm. No rashes, no other new lesions, LE edema - none               Psychiatric: Pt behavior is normal without agitation   Micro: none  Cardiac tracings I have personally interpreted today:  none  Pertinent Radiological findings (summarize): none   Lab Results  Component Value Date   WBC 5.8 01/31/2023   HGB 13.5 01/31/2023   HCT 41.9 01/31/2023   PLT 204  01/31/2023   GLUCOSE 93 01/31/2023   CHOL 192 07/09/2023   TRIG 71.0 07/09/2023   HDL 60.80 07/09/2023   LDLCALC 117 (H) 07/09/2023   ALT 13 01/31/2023   AST 17 01/31/2023   NA 139 01/31/2023   K 4.6 01/31/2023   CL 104 01/31/2023   CREATININE 0.76 01/31/2023   BUN 16 01/31/2023   CO2 23 01/31/2023    TSH 3.36 07/09/2023   HGBA1C 5.3 07/09/2023   Assessment/Plan:  Hannah Cain is a 52 y.o. White or Caucasian [1] female with  has a past medical history of ALLERGIC RHINITIS (03/17/2007), Anemia, Arthritis, B12 deficiency, Back pain, Basal cell carcinoma (BCC) (06/10/2017), Constipation, GERD (gastroesophageal reflux disease), High blood pressure, HLD (hyperlipidemia) (06/10/2017), Joint pain, Palpitations (11/12/2017), Symptomatic PVCs (06/11/2018), URI (07/19/2009), Vitamin B 12 deficiency, and Vitamin D deficiency.  Encounter for well adult exam with abnormal findings Age and sex appropriate education and counseling updated with regular exercise and diet Referrals for preventative services - none needed Immunizations addressed - for shingrix at pharmacy Smoking counseling  - none needed Evidence for depression or other mood disorder - chronic anxiety stable Most recent labs reviewed. I have personally reviewed and have noted: 1) the patient's medical and social history 2) The patient's current medications and supplements 3) The patient's height, weight, and BMI have been recorded in the chart'  Hypercholesteremia Lab Results  Component Value Date   LDLCALC 117 (H) 07/09/2023   Uncontrolled, declines statin for now, pt to continue low chol diet   Hypertension BP Readings from Last 3 Encounters:  07/14/23 132/76  07/04/23 122/70  06/24/23 128/77   Stable, pt to continue medical treatment  - diet, wt control   B12 deficiency Unable to tolerate oral - for B12 shots monthly at home  Vitamin D deficiency Last vitamin D Lab Results  Component Value Date   VD25OH 44.51 07/09/2023   Stable, cont oral replacement  Followup: Return in about 1 year (around 07/13/2024).  Oliver Barre, MD 07/17/2023 2:48 PM [facility]

## 2023-07-16 ENCOUNTER — Other Ambulatory Visit (INDEPENDENT_AMBULATORY_CARE_PROVIDER_SITE_OTHER): Payer: Self-pay | Admitting: Physician Assistant

## 2023-07-16 DIAGNOSIS — E88819 Insulin resistance, unspecified: Secondary | ICD-10-CM

## 2023-07-17 ENCOUNTER — Encounter: Payer: Self-pay | Admitting: Internal Medicine

## 2023-07-17 NOTE — Assessment & Plan Note (Signed)
 BP Readings from Last 3 Encounters:  07/14/23 132/76  07/04/23 122/70  06/24/23 128/77   Stable, pt to continue medical treatment  - diet, wt control

## 2023-07-17 NOTE — Assessment & Plan Note (Signed)
 Last vitamin D Lab Results  Component Value Date   VD25OH 44.51 07/09/2023   Stable, cont oral replacement

## 2023-07-17 NOTE — Assessment & Plan Note (Signed)
 Lab Results  Component Value Date   LDLCALC 117 (H) 07/09/2023   Uncontrolled, declines statin for now, pt to continue low chol diet

## 2023-07-17 NOTE — Assessment & Plan Note (Signed)
 Unable to tolerate oral - for B12 shots monthly at home

## 2023-07-17 NOTE — Assessment & Plan Note (Signed)
 Age and sex appropriate education and counseling updated with regular exercise and diet Referrals for preventative services - none needed Immunizations addressed - for shingrix at pharmacy Smoking counseling  - none needed Evidence for depression or other mood disorder - chronic anxiety stable Most recent labs reviewed. I have personally reviewed and have noted: 1) the patient's medical and social history 2) The patient's current medications and supplements 3) The patient's height, weight, and BMI have been recorded in the chart'

## 2023-07-18 ENCOUNTER — Ambulatory Visit: Admitting: Sports Medicine

## 2023-07-20 NOTE — Progress Notes (Unsigned)
 SUBJECTIVE: Discussed the use of AI scribe software for clinical note transcription with the patient, who gave verbal consent to proceed.  Chief Complaint: Obesity  Interim History: She has maintained her weight since last visit.   Hannah Cain is here to discuss her progress with her obesity treatment plan. She is on the Category 1 Plan and states she is following her eating plan approximately 70 % of the time. She states she is exercising doing cardio and walking 1-2 miles for 30 minutes 5 times weekly, also physical therapy 2 times weekly for 45 minutes.  Hannah Cain is a 53 year old female who presents for follow-up of her obesity treatment plan.  She has been adhering to her obesity treatment plan and reports a positive week and a half, achieving some exercise without significant pain. She started metformin about a week after her last visit and experiences occasional upset stomach, which she attributes to dietary choices. She is focusing on her diet, maintaining a routine with breakfast and dinner, though she finds lunch options repetitive. Her blood pressure is good, and her adipose percentage is at 45%, with a target of 35%.  She has a history of insulin resistance and is currently on metformin. No significant issues with the medication aside from occasional stomach upset. Metformin is being used to help with hunger and appetite control. She is managing her diet and exercise to aid in weight loss.  She has a history of arthralgia affecting multiple joints, limiting her mobility. She attends physical therapy, including dry needling, and reports improvement in her back pain. She is on a three-week course of meloxicam, which she believes is helping, though she occasionally supplements with Tylenol on rough days. There is some anxiety about discontinuing meloxicam.  She experiences menopausal symptoms, including sleep disturbances. She is unsure if she is postmenopausal due to being on Micronor  for three years and plans to discuss this with her gynecologist later this month. She has a family history of breast cancer, with her grandmother and several of her grandmother's sisters affected, though she is not BRCA positive.  She has a history of vitamin D and B12 deficiencies and is currently taking over-the-counter vitamin D supplements at a dose of 5000 IU, having previously been on a prescription dose.  OBJECTIVE: Visit Diagnoses: Problem List Items Addressed This Visit     Vitamin D deficiency   Relevant Medications   Cholecalciferol (VITAMIN D3) 125 MCG (5000 UT) CAPS   Generalized obesity with inital BMI 36   Relevant Medications   metFORMIN (GLUCOPHAGE) 500 MG tablet   Vitamin B 12 deficiency   Insulin resistance - Primary   Relevant Medications   metFORMIN (GLUCOPHAGE) 500 MG tablet   Other Visit Diagnoses       Menopausal symptoms         BMI 36.0-36.9,adult Currrent BMI 36.8         Arthralgia, multiple joints         Obesity She is being managed for obesity with metformin, which she started a week after the last visit. She reports occasional gastrointestinal upset, potentially related to dietary intake. Metformin aids in hunger and appetite control. She has experienced weight loss and improved exercise tolerance with less pain. Her adipose percentage is 45%, with a target of 35%. - Continue metformin - Encourage dietary modifications, including increased protein intake and meal variety - Promote physical activity, including yard work and other enjoyable activities - Schedule follow-up in 4-6 weeks  Insulin  Resistance She is on metformin for insulin resistance, reporting occasional gastrointestinal upset possibly linked to dietary intake. Metformin assists with hunger and appetite control. - Continue metformin - Monitor dietary intake to minimize gastrointestinal side effects Meds ordered this encounter  Medications   metFORMIN (GLUCOPHAGE) 500 MG tablet     Sig: Take 1 tablet (500 mg total) by mouth daily.    Dispense:  30 tablet    Refill:  1   Cholecalciferol (VITAMIN D3) 125 MCG (5000 UT) CAPS    Sig: Take 1 capsule (5,000 Units total) by mouth daily.    Dispense:  30 capsule    Refill:  0    Arthralgia She experiences arthralgia affecting multiple joints, limiting mobility. She is undergoing physical therapy, including dry needling and exercises. A three-week course of meloxicam may be beneficial, though she occasionally uses acetaminophen. There is anxiety about discontinuing meloxicam. Improvement may be due to the combination of physical therapy and medication. - Continue physical therapy - Complete current course of meloxicam - Consider acetaminophen for breakthrough pain - Monitor response after discontinuing meloxicam  Menopausal Symptoms She experiences menopausal symptoms, including sleep disturbances and potential exacerbation of arthralgia. Estrogen replacement therapy was discussed, considering her family history of breast cancer and absence of BRCA mutations. Estrogen may improve symptoms and reduce colon cancer risk but slightly increases breast and uterine cancer risk. A recent study showed significant improvement in inflammatory arthritis symptoms with estrogen therapy in postmenopausal women. - Discuss estrogen replacement therapy with gynecologist - Consider estradiol patch if deemed appropriate by gynecologist  Vitamin D Deficiency She is taking over-the-counter vitamin D supplements at 5000 IU daily for vitamin D deficiency. - Continue over-the-counter vitamin D supplementation at 5000 IU daily  Vitals Temp: 98 F (36.7 C) BP: 128/77 Pulse Rate: 81 SpO2: 98 %   Anthropometric Measurements Height: 5\' 7"  (1.702 m) Weight: 235 lb (106.6 kg) BMI (Calculated): 36.8 Weight at Last Visit: 235 lb Weight Lost Since Last Visit: 0 Weight Gained Since Last Visit: 0 Starting Weight: 228 lb Total Weight Loss (lbs): 0 lb  (0 kg) Peak Weight: 244 lb   Body Composition  Body Fat %: 45.2 % Fat Mass (lbs): 106.4 lbs Muscle Mass (lbs): 122.4 lbs Total Body Water (lbs): 84.4 lbs Visceral Fat Rating : 12   Other Clinical Data Fasting: no Labs: no Today's Visit #: 11 Starting Date: 09/04/22     ASSESSMENT AND PLAN:  Diet: Yarelli is currently in the action stage of change. As such, her goal is to continue with weight loss efforts. She has agreed to Category 1 Plan.  Exercise: Skyanne has been instructed to work up to a goal of 150 minutes of combined cardio and strengthening exercise per week for weight loss and overall health benefits.   Behavior Modification:  We discussed the following Behavioral Modification Strategies today: increasing lean protein intake, decreasing simple carbohydrates, increasing vegetables, increase H2O intake, increase high fiber foods, emotional eating strategies , avoiding temptations, and planning for success. We discussed various medication options to help Taneisha with her weight loss efforts and we both agreed to continue to work on nutritional and behavioral strategies to promote weight loss.  .  Return in about 6 weeks (around 09/01/2023).Aaron Aas She was informed of the importance of frequent follow up visits to maximize her success with intensive lifestyle modifications for her multiple health conditions.  Attestation Statements:   Reviewed by clinician on day of visit: allergies, medications, problem list, medical history, surgical history,  family history, social history, and previous encounter notes.   Time spent on visit including pre-visit chart review and post-visit care and charting was 29 minutes.    Brissia Delisa, PA-C

## 2023-07-21 ENCOUNTER — Ambulatory Visit (INDEPENDENT_AMBULATORY_CARE_PROVIDER_SITE_OTHER): Admitting: Physician Assistant

## 2023-07-21 ENCOUNTER — Encounter (INDEPENDENT_AMBULATORY_CARE_PROVIDER_SITE_OTHER): Payer: Self-pay | Admitting: Physician Assistant

## 2023-07-21 VITALS — BP 128/77 | HR 81 | Temp 98.0°F | Ht 67.0 in | Wt 235.0 lb

## 2023-07-21 DIAGNOSIS — Z6836 Body mass index (BMI) 36.0-36.9, adult: Secondary | ICD-10-CM

## 2023-07-21 DIAGNOSIS — E538 Deficiency of other specified B group vitamins: Secondary | ICD-10-CM

## 2023-07-21 DIAGNOSIS — E669 Obesity, unspecified: Secondary | ICD-10-CM

## 2023-07-21 DIAGNOSIS — M2550 Pain in unspecified joint: Secondary | ICD-10-CM

## 2023-07-21 DIAGNOSIS — E88819 Insulin resistance, unspecified: Secondary | ICD-10-CM | POA: Diagnosis not present

## 2023-07-21 DIAGNOSIS — E559 Vitamin D deficiency, unspecified: Secondary | ICD-10-CM | POA: Diagnosis not present

## 2023-07-21 DIAGNOSIS — N951 Menopausal and female climacteric states: Secondary | ICD-10-CM

## 2023-07-21 MED ORDER — METFORMIN HCL 500 MG PO TABS: 500.0000 mg | ORAL_TABLET | Freq: Every day | ORAL | 1 refills | Status: DC

## 2023-07-21 MED ORDER — VITAMIN D3 125 MCG (5000 UT) PO CAPS: 5000.0000 [IU] | ORAL_CAPSULE | Freq: Every day | ORAL | 0 refills | Status: AC

## 2023-07-28 ENCOUNTER — Encounter: Payer: Self-pay | Admitting: Internal Medicine

## 2023-07-28 NOTE — Telephone Encounter (Signed)
 Ok to assist pt with Nurse Visit to demonstrate giving the shots.  thanks

## 2023-07-31 ENCOUNTER — Other Ambulatory Visit: Payer: Self-pay | Admitting: Sports Medicine

## 2023-08-14 NOTE — Progress Notes (Addendum)
 Ben Nasri Boakye D.[facility] [address] [place] [address] Phone: [phone]   Assessment and Plan:     1. Polyarthralgia (Primary) -Chronic, unchanged, subsequent visit - Continued multiple areas of musculoskeletal pain.  Patient has seen rheumatology and per patient, was told that symptoms were likely related to osteoarthritis, bursitis, and other musculoskeletal conditions and not related to autoimmune conditions.  No further workup required - Discussed that we could try gabapentin  versus Cymbalta to decrease broad pain.  Ultimately decided to not start either medication at today's visit  2. Chronic pain of both knees 3. Primary osteoarthritis of right knee -Chronic with exacerbation, subsequent visit - Chronic pain in bilateral knees, typically worse than right.  Consistent with flare of right knee osteoarthritis.  Left knee pain is likely due to compensation - X-rays obtained in clinic.  My interpretation: No acute fracture or dislocation.  Moderate tricompartmental degenerative changes in right knee. -Patient elected for intra-articular CSI to right knee.  Tolerated well per note below  Procedure: Knee Joint Injection Side: Right Indication: Flare of osteoarthritis  Risks explained and consent was given verbally. The site was cleaned with alcohol prep. A needle was introduced with an anterio-lateral approach. Injection given using 2mL of 1% lidocaine without epinephrine and 1mL of kenalog 40mg /ml. This was well tolerated and resulted in symptomatic relief.  Needle was removed, hemostasis achieved, and post injection instructions were explained.   Pt was advised to call or return to clinic if these symptoms worsen or fail to improve as anticipated.   4. Greater trochanteric bursitis of right hip -Chronic with exacerbation, subsequent visit - Most consistent with greater trochanteric bursitis of right hip based on HPI, physical exam, x-ray  imaging - X-ray obtained in clinic.  My interpretation: No acute fracture or dislocation.  Mild intra-articular degenerative changes - Patient elected for greater trochanteric CSI.  Tolerated well per note below - Start HEP for gluteal musculature  Procedure: Greater trochanteric bursal injection Side: Right  Risks explained and consent was given verbally. The site was cleaned with alcohol prep. A steroid injection was performed with patient in the lateral side-lying position at area of maximum tenderness over greater trochanter using 2mL of 1% lidocaine without epinephrine and 1mL of kenalog 40mg /ml. This was well tolerated and resulted in symptomatic relief.  Needle was removed, hemostasis achieved, and post injection instructions were explained.  Pt was advised to call or return to clinic if these symptoms worsen or fail to improve as anticipated.   15 additional minutes spent for educating Therapeutic Home Exercise Program.  This included exercises focusing on stretching, strengthening, with focus on eccentric aspects.   Long term goals include an improvement in range of motion, strength, endurance as well as avoiding reinjury. Patient's frequency would include in 1-2 times a day, 3-5 times a week for a duration of 6-12 weeks. Proper technique shown and discussed handout in great detail with ATC.  All questions were discussed and answered.     Pertinent previous records reviewed include none  Follow Up: 4 weeks for reevaluation.  If no improvement or worsening of symptoms, could further discuss gabapentin  versus Cymbalta   Subjective:   I, [name], am serving as a Neurosurgeon for Doctor Ulysees Gander   Chief Complaint: left foot pain    HPI:    07/15/22 Patient is a 53 year old female complaining of left foot pain. Patient states that all her joints are hurting, lateral heel pain  that radiates through the front of her calf, intermittent pain when walking so bad that she has to sit  down, antalgic gait , she will get anterior foot pain , she has doubled her vitamin D , does note some numbness through her leg not much in her foot , tylenol  for the pain and advil PM , the pain will keep her up at night ,    07/04/2023 Patient states that it is ok today. Lab work was done and there was no autoimmune issues present. Neck, L hip and back are the issues today. Knees hurt from time to time as well. Drove for 10 hours straight and that flares the back and hip. Was doing PT for the back. Paused for a bit, but now restarted it for back and hip and dry needling. Neck pain on left side that radiates down to the shoulder area. Limited ROM with the left shoulder.   08/15/2023 Patient states was seen by rheumatology and they recommended arthritis treatment   Relevant Historical Information: Hypertension  Additional pertinent review of systems negative.   Current Outpatient Medications:    Cholecalciferol (VITAMIN D3) 125 MCG (5000 UT) CAPS, Take 1 capsule (5,000 Units total) by mouth daily., Disp: 30 capsule, Rfl: 0   cyanocobalamin  (VITAMIN B12) 1000 MCG/ML injection, Inject 1 mL (1,000 mcg total) into the muscle every 30 (thirty) days., Disp: 3 mL, Rfl: 3   EPIPEN 2-PAK 0.3 MG/0.3ML SOAJ injection, as directed Injection as directed for 30 days, Disp: , Rfl:    Fexofenadine HCl (ALLEGRA ALLERGY PO), Take by mouth., Disp: , Rfl:    gabapentin  (NEURONTIN ) 100 MG capsule, Take 1 tablet at bedtime x 1 week, then increase to 1 tablet twice daily., Disp: 60 capsule, Rfl: 3   Lifitegrast (XIIDRA) 5 % SOLN, Apply to eye., Disp: , Rfl:    meloxicam  (MOBIC ) 15 MG tablet, Take 1 tablet (15 mg total) by mouth daily., Disp: 30 tablet, Rfl: 0   metFORMIN  (GLUCOPHAGE ) 500 MG tablet, Take 1 tablet (500 mg total) by mouth daily., Disp: 30 tablet, Rfl: 1   Multiple Vitamin (MULTIVITAMIN) tablet, Take 1 tablet by mouth daily., Disp: , Rfl:    norethindrone (MICRONOR) 0.35 MG tablet, Take 1 tablet by mouth  daily., Disp: , Rfl:    RESTASIS 0.05 % ophthalmic emulsion, 1 drop 2 (two) times daily., Disp: , Rfl:    Objective:     Vitals:   08/15/23 1507  Pulse: 78  SpO2: 98%  Weight: 235 lb (106.6 kg)  Height: 5\' 7"  (1.702 m)      Body mass index is 36.81 kg/m.    Physical Exam:    General: awake, alert, and oriented no acute distress, nontoxic Skin: no suspicious lesions or rashes Neuro:sensation intact distally with no deficits, normal muscle tone, no atrophy, strength 5/5 in all tested lower ext groups Psych: normal mood and affect, speech clear   Right hip: No deformity, swelling or wasting ROM Flexion 90, ext 30, IR 45, ER 45 TTP greater trochanter NTTP over the hip flexors,  , gluteal musculature, si joint, lumbar spine Negative log roll with FROM Negative FABER Negative FADIR Negative Piriformis test, though increased tension on right compared to left   Gait normal    Electronically signed by:  Marshall Skeeter D.[facility] 4:02 PM 08/15/23

## 2023-08-15 ENCOUNTER — Ambulatory Visit: Admitting: Sports Medicine

## 2023-08-15 ENCOUNTER — Ambulatory Visit (INDEPENDENT_AMBULATORY_CARE_PROVIDER_SITE_OTHER)

## 2023-08-15 VITALS — HR 78 | Ht 67.0 in | Wt 235.0 lb

## 2023-08-15 DIAGNOSIS — G8929 Other chronic pain: Secondary | ICD-10-CM | POA: Diagnosis not present

## 2023-08-15 DIAGNOSIS — M25562 Pain in left knee: Secondary | ICD-10-CM

## 2023-08-15 DIAGNOSIS — M7061 Trochanteric bursitis, right hip: Secondary | ICD-10-CM

## 2023-08-15 DIAGNOSIS — M2550 Pain in unspecified joint: Secondary | ICD-10-CM | POA: Diagnosis not present

## 2023-08-15 DIAGNOSIS — M25561 Pain in right knee: Secondary | ICD-10-CM | POA: Diagnosis not present

## 2023-08-15 DIAGNOSIS — M1711 Unilateral primary osteoarthritis, right knee: Secondary | ICD-10-CM

## 2023-08-15 NOTE — Patient Instructions (Signed)
 Glute and knee HEP  3-4 week follow up

## 2023-08-19 ENCOUNTER — Ambulatory Visit: Payer: Self-pay | Admitting: Sports Medicine

## 2023-08-22 ENCOUNTER — Ambulatory Visit

## 2023-08-22 ENCOUNTER — Ambulatory Visit
Admission: RE | Admit: 2023-08-22 | Discharge: 2023-08-22 | Disposition: A | Discharge status: Home / Self Care | Payer: 59 | Source: Ambulatory Visit | Attending: Obstetrics | Admitting: Obstetrics

## 2023-08-22 DIAGNOSIS — E538 Deficiency of other specified B group vitamins: Secondary | ICD-10-CM | POA: Diagnosis not present

## 2023-08-22 DIAGNOSIS — Z1231 Encounter for screening mammogram for malignant neoplasm of breast: Secondary | ICD-10-CM

## 2023-08-22 MED ORDER — CYANOCOBALAMIN 1000 MCG/ML IJ SOLN
1000.0000 ug | Freq: Once | INTRAMUSCULAR | Status: AC
  Administered 2023-08-22: 1000 ug via INTRAMUSCULAR

## 2023-08-22 NOTE — Progress Notes (Signed)
Pt was given B12 injection with no complications.

## 2023-08-26 ENCOUNTER — Ambulatory Visit (INDEPENDENT_AMBULATORY_CARE_PROVIDER_SITE_OTHER): Admitting: Physician Assistant

## 2023-08-26 IMAGING — XA DG FLUORO GUIDE NDL PLC/BX
2 series · 2 of 2 positions shown · non-contrast
Comparison: none

CLINICAL DATA: Chronic left shoulder pain.

[Series 1: ortho standard · 1 of 1 slices shown (1 of 2)]
[im 1/1]
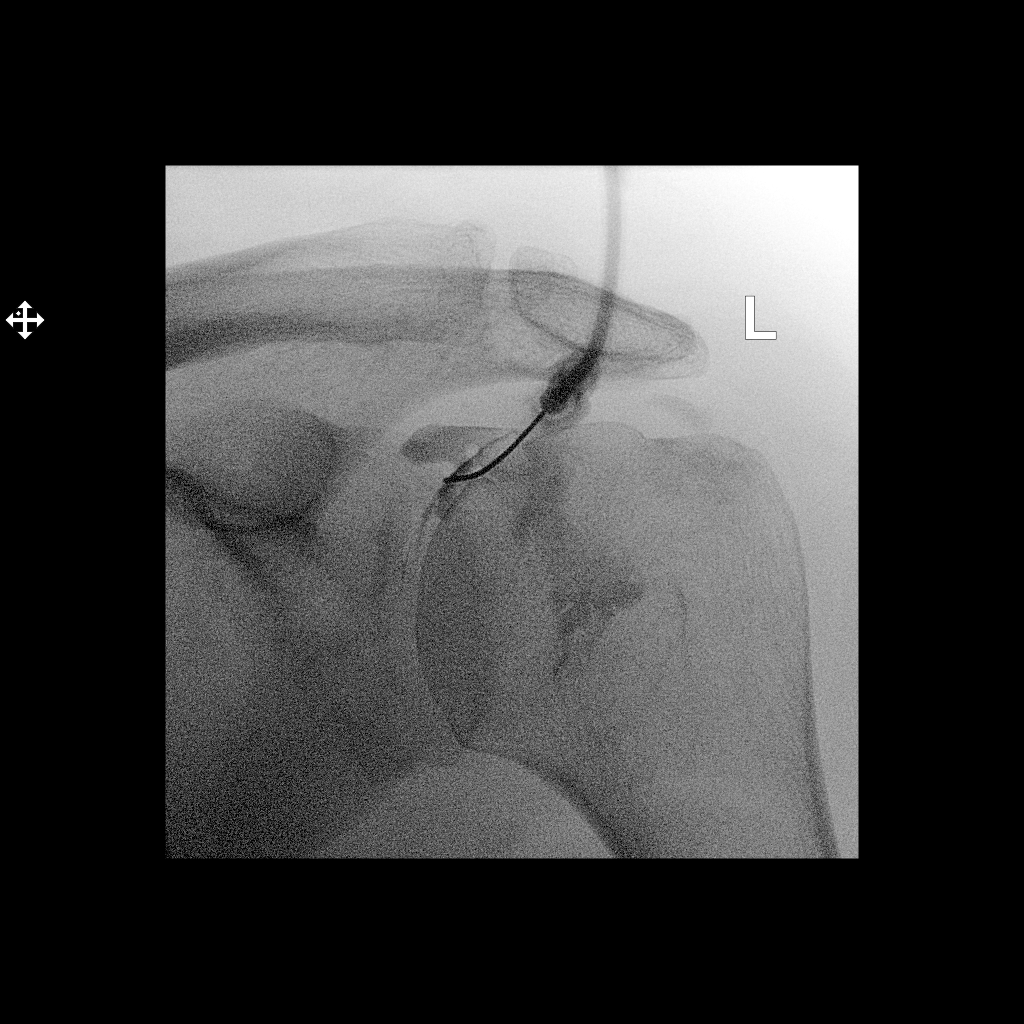

[Series 2: ortho standard · 1 of 1 slices shown (2 of 2)]
[im 1/1]
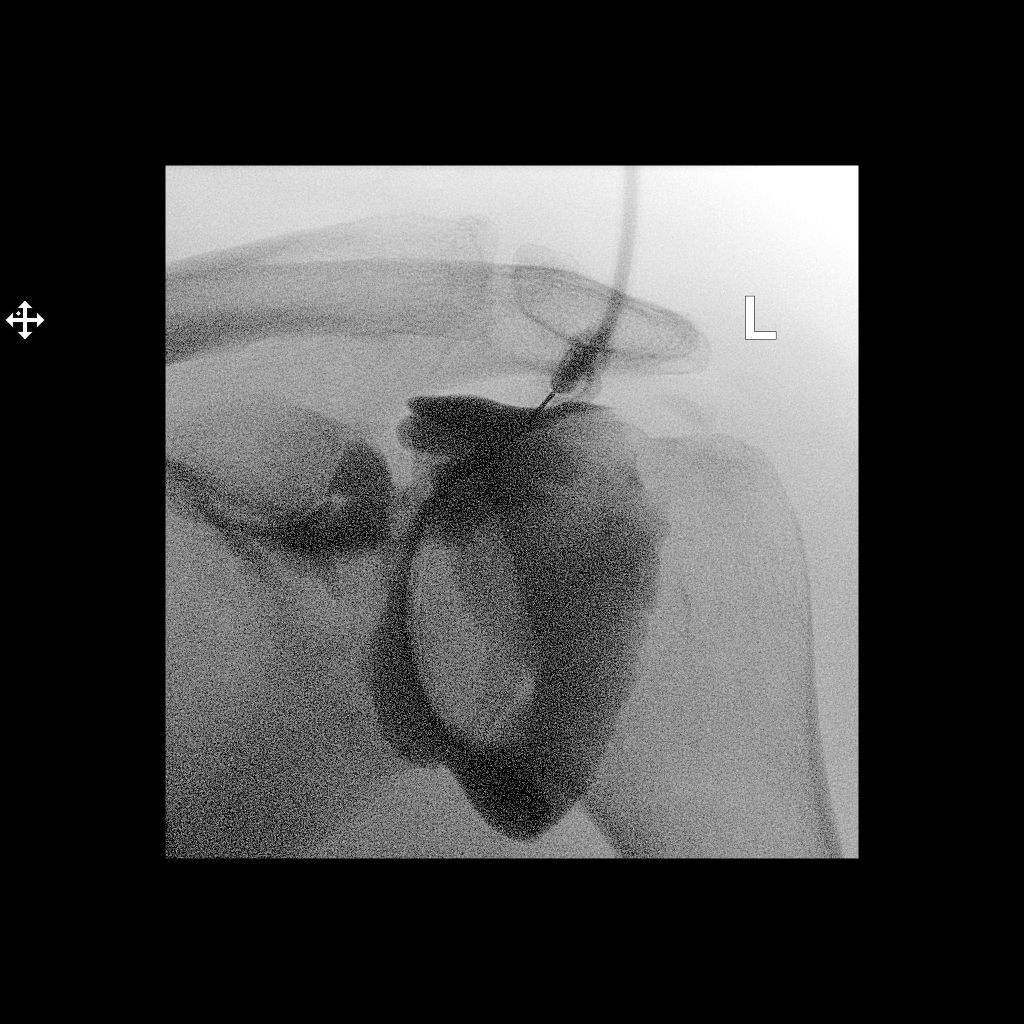

[2 of 2 positions shown; findings below may reference images not displayed]

FLUOROSCOPY TIME:  Radiation Exposure Index (as provided by the
fluoroscopic device): 0.3 mGy Kerma

PROCEDURE:
The risks and benefits of the procedure were discussed with the
patient, and written informed consent was obtained. The patient
stated no history of allergy to contrast media. A formal timeout
procedure was performed with the patient according to departmental
protocol.

The patient was placed supine on the fluoroscopy table and the left
glenohumeral joint was identified under fluoroscopy. Rotator cuff
calcification again noted. The skin overlying the left glenohumeral
joint was subsequently cleaned with Betadine and a sterile drape was
placed over the area of interest. 2 ml 1% Lidocaine was used to
anesthetize the skin around the needle insertion site.

A 22 gauge spinal needle was inserted into the left glenohumeral
joint under fluoroscopy.

12 ml of gadolinium mixture (0.1 ml of Multihance mixed with 15 ml
of Isovue-M 200 contrast and 5 ml of sterile saline) were injected
into the left glenohumeral joint.

The needle was removed and hemostasis was achieved. The patient was
subsequently transferred to MRI for imaging.
IMPRESSION: 1. Technically successful left shoulder injection for MRI.
2. Unchanged rotator cuff calcific tendinosis.

## 2023-09-09 NOTE — Progress Notes (Unsigned)
    Ben Jackson D.[facility] [address] [place] [address] Phone: [phone]   Assessment and Plan:     There are no diagnoses linked to this encounter.  ***   Pertinent previous records reviewed include ***    Follow Up: ***     Subjective:   I, Shakayla Hickox, am serving as a Neurosurgeon for Doctor Ulysees Gander   Chief Complaint: left foot pain    HPI:    07/15/22 Patient is a 53 year old female complaining of left foot pain. Patient states that all her joints are hurting, lateral heel pain that radiates through the front of her calf, intermittent pain when walking so bad that she has to sit down, antalgic gait , she will get anterior foot pain , she has doubled her vitamin D , does note some numbness through her leg not much in her foot , tylenol  for the pain and advil PM , the pain will keep her up at night ,    07/04/2023 Patient states that it is ok today. Lab work was done and there was no autoimmune issues present. Neck, L hip and back are the issues today. Knees hurt from time to time as well. Drove for 10 hours straight and that flares the back and hip. Was doing PT for the back. Paused for a bit, but now restarted it for back and hip and dry needling. Neck pain on left side that radiates down to the shoulder area. Limited ROM with the left shoulder.    08/15/2023 Patient states was seen by rheumatology and they recommended arthritis treatment  09/10/2023 Patient states   Relevant Historical Information: Hypertension  Additional pertinent review of systems negative.   Current Outpatient Medications:    Cholecalciferol (VITAMIN D3) 125 MCG (5000 UT) CAPS, Take 1 capsule (5,000 Units total) by mouth daily., Disp: 30 capsule, Rfl: 0   cyanocobalamin  (VITAMIN B12) 1000 MCG/ML injection, Inject 1 mL (1,000 mcg total) into the muscle every 30 (thirty) days., Disp: 3 mL, Rfl: 3   EPIPEN 2-PAK 0.3 MG/0.3ML SOAJ injection, as directed  Injection as directed for 30 days, Disp: , Rfl:    Fexofenadine HCl (ALLEGRA ALLERGY PO), Take by mouth., Disp: , Rfl:    gabapentin  (NEURONTIN ) 100 MG capsule, Take 1 tablet at bedtime x 1 week, then increase to 1 tablet twice daily., Disp: 60 capsule, Rfl: 3   Lifitegrast (XIIDRA) 5 % SOLN, Apply to eye., Disp: , Rfl:    meloxicam  (MOBIC ) 15 MG tablet, Take 1 tablet (15 mg total) by mouth daily., Disp: 30 tablet, Rfl: 0   metFORMIN  (GLUCOPHAGE ) 500 MG tablet, Take 1 tablet (500 mg total) by mouth daily., Disp: 30 tablet, Rfl: 1   Multiple Vitamin (MULTIVITAMIN) tablet, Take 1 tablet by mouth daily., Disp: , Rfl:    norethindrone (MICRONOR) 0.35 MG tablet, Take 1 tablet by mouth daily., Disp: , Rfl:    RESTASIS 0.05 % ophthalmic emulsion, 1 drop 2 (two) times daily., Disp: , Rfl:    Objective:     There were no vitals filed for this visit.    There is no height or weight on file to calculate BMI.    Physical Exam:    ***   Electronically signed by:  Marshall Skeeter D[facility] 7:37 AM 09/09/23

## 2023-09-10 ENCOUNTER — Ambulatory Visit: Admitting: Sports Medicine

## 2023-09-10 VITALS — HR 65 | Ht 67.0 in | Wt 237.0 lb

## 2023-09-10 DIAGNOSIS — M2550 Pain in unspecified joint: Secondary | ICD-10-CM | POA: Diagnosis not present

## 2023-09-10 DIAGNOSIS — M7061 Trochanteric bursitis, right hip: Secondary | ICD-10-CM

## 2023-09-10 DIAGNOSIS — G8929 Other chronic pain: Secondary | ICD-10-CM

## 2023-09-10 DIAGNOSIS — M25562 Pain in left knee: Secondary | ICD-10-CM

## 2023-09-10 DIAGNOSIS — M1711 Unilateral primary osteoarthritis, right knee: Secondary | ICD-10-CM

## 2023-09-10 DIAGNOSIS — M25561 Pain in right knee: Secondary | ICD-10-CM

## 2023-09-10 MED ORDER — MELOXICAM 15 MG PO TABS: 15.0000 mg | ORAL_TABLET | Freq: Every day | ORAL | 0 refills | Status: AC | PRN

## 2023-09-10 NOTE — Patient Instructions (Signed)
 Tylenol  [phone] mg 2-3 times a day for pain relief  Meloxicam  as needed for  breakthrough limit 1-2 times per week  Meloxicam  refill  As needed follow up

## 2023-09-23 ENCOUNTER — Ambulatory Visit: Admitting: Neurology

## 2023-09-29 ENCOUNTER — Encounter (INDEPENDENT_AMBULATORY_CARE_PROVIDER_SITE_OTHER): Payer: Self-pay | Admitting: Physician Assistant

## 2023-09-29 ENCOUNTER — Ambulatory Visit (INDEPENDENT_AMBULATORY_CARE_PROVIDER_SITE_OTHER): Admitting: Physician Assistant

## 2023-09-29 VITALS — BP 135/80 | HR 67 | Temp 98.9°F | Ht 67.0 in | Wt 237.0 lb

## 2023-09-29 DIAGNOSIS — E559 Vitamin D deficiency, unspecified: Secondary | ICD-10-CM

## 2023-09-29 DIAGNOSIS — E88819 Insulin resistance, unspecified: Secondary | ICD-10-CM | POA: Diagnosis not present

## 2023-09-29 DIAGNOSIS — M2550 Pain in unspecified joint: Secondary | ICD-10-CM

## 2023-09-29 DIAGNOSIS — N951 Menopausal and female climacteric states: Secondary | ICD-10-CM

## 2023-09-29 DIAGNOSIS — Z6837 Body mass index (BMI) 37.0-37.9, adult: Secondary | ICD-10-CM

## 2023-09-29 DIAGNOSIS — E669 Obesity, unspecified: Secondary | ICD-10-CM | POA: Diagnosis not present

## 2023-09-29 MED ORDER — METFORMIN HCL 500 MG PO TABS: 500.0000 mg | ORAL_TABLET | Freq: Every day | ORAL | 1 refills | Status: DC

## 2023-09-29 NOTE — Progress Notes (Signed)
 SUBJECTIVE: Discussed the use of AI scribe software for clinical note transcription with the patient, who gave verbal consent to proceed.  Chief Complaint: Obesity  Interim History: She is up 2 lbs since last visit.  Muscle mass + 2.6 lbs Adipose mass -1.0 lbs  Hannah Cain is here to discuss her progress with her obesity treatment plan. She is on the Category 3 Plan and states she is following her eating plan approximately 80 % of the time. She states she is exercising 30 minutes 7 times per week.  History of Present Illness Hannah Cain is a 53 year old female who presents for follow-up of her obesity treatment plan.  She feels generally better and less achy since her last visit. After receiving an injection in her knee, she describes significant improvement in her walking and a reduction in night sweats. Her sleep quality initially improved but has since tapered off, although she is experiencing fewer night sweats.  She has been using an estradiol patch, initially at a lower dose. She was supposed to increase to 0.75 mg, but the pharmacy did not have it in stock.   She has a history of insulin  resistance and was previously taking metformin , which she ran out of due to a missed appointment. She noticed less acid reflux after stopping metformin .  She has been more active recently, noting an increase in muscle mass and a decrease in body fat percentage. She attributes this to changes in her exercise routine and is pleased with the progress, despite an initial weight gain which she suspects was fluid-related.  Her blood pressure was noted to be higher than usual, possibly due to increased fluid intake and activity in hot weather. She does not typically consume much salt and has been drinking more water due to increased outdoor activity and sweating.  She is working on increasing her protein intake, which she finds challenging. She has been trying to adjust her diet to include more protein, but  finds it difficult to consume the recommended amount.    OBJECTIVE: Visit Diagnoses: Problem List Items Addressed This Visit     Vitamin D  deficiency   Generalized obesity with inital BMI 36   Relevant Medications   metFORMIN  (GLUCOPHAGE ) 500 MG tablet   Insulin  resistance - Primary   Relevant Medications   metFORMIN  (GLUCOPHAGE ) 500 MG tablet   Other Visit Diagnoses       Arthralgia, multiple joints         Menopausal symptoms         BMI 37.0-37.9, adult Current BMI 37.1         Assessment and Plan Obesity Hannah Cain is being followed for obesity management. She has shown improvements in general well-being and increased physical activity, resulting in increased muscle mass and decreased body fat percentage. Emphasis was placed on the importance of muscle mass in enhancing metabolism and reducing adiposity, especially during menopause. Her body fat percentage has decreased to 44.5%, with a target of 35%. - Continue current exercise regimen - Maintain protein-rich diet - Monitor body composition and weight  Insulin  Resistance Hannah Cain has prediabetes and was previously on metformin , which she ran out of due to a gap in appointments. The benefits of metformin  in managing insulin  resistance, particularly during menopause, and its potential anti-aging and anti-inflammatory effects were discussed. She reported less acid reflux after stopping metformin , acknowledged as a possible side effect. Resuming metformin  was recommended to help with insulin  resistance and central adiposity, with the option to  discontinue if dyspepsia persists. - Prescribe metformin  with a 60-day supply and one refill - Monitor for dyspepsia and discontinue if symptoms persist Meds ordered this encounter  Medications   metFORMIN  (GLUCOPHAGE ) 500 MG tablet    Sig: Take 1 tablet (500 mg total) by mouth daily.    Dispense:  60 tablet    Refill:  1    Menopausal Symptoms Hannah Cain is experiencing menopausal  symptoms, including night sweats and sleep disturbances. She is using an estradiol patch, which has helped reduce night sweats and improve sleep quality. She is considering increasing the dose to 0.75 mg for better symptomatic relief, but there is a supply issue at her current pharmacy. It was suggested to try a different pharmacy to obtain the higher dose. The potential for fluid retention with estradiol use, which may affect weight and blood pressure, was discussed. - Attempt to obtain 0.75 mg estradiol patch from a different pharmacy - Monitor for improvement in menopausal symptoms - Consider magnesium supplementation for sleep if symptoms persist  Elevated BP Hannah Cain's blood pressure was recorded at 135/80 mmHg, slightly elevated compared to her usual readings. This was attributed to possible fluid retention and increased water intake due to hot weather and increased physical activity. She is advised to monitor her salt intake and continue her current lifestyle modifications. - Monitor blood pressure - Advise on salt intake and fluid management  Vitamin D  and B12 Deficiencies Hannah Cain has vitamin D  and B12 deficiencies. There was no specific discussion about current supplementation or levels in this visit.  General Health Maintenance The importance of maintaining a balanced diet with sufficient protein intake to support muscle mass and metabolism was discussed. Hannah Cain is advised to consider intermittent fasting and protein supplementation if needed to meet dietary goals. The potential benefits of protein shots for meeting protein requirements were also discussed. - Consider intermittent fasting - Consider protein supplementation if dietary intake is insufficient  Follow-up Hannah Cain is scheduled for a follow-up appointment to assess progress in weight management and overall health. - Schedule follow-up appointment on August 11 at 9:30 AM  Vitals Temp: 98.9 F (37.2 C) BP: 135/80 Pulse Rate:  67 SpO2: 98 %   Anthropometric Measurements Height: 5' 7 (1.702 m) Weight: 237 lb (107.5 kg) BMI (Calculated): 37.11 Weight at Last Visit: 235 lb Weight Lost Since Last Visit: 0 Weight Gained Since Last Visit: 2 lb Starting Weight: 228 lb Total Weight Loss (lbs): 0 lb (0 kg)   Body Composition  Body Fat %: 44.5 % Fat Mass (lbs): 105.4 lbs Muscle Mass (lbs): 125 lbs Total Body Water (lbs): 86.4 lbs Visceral Fat Rating : 12   Other Clinical Data Fasting: No Labs: No Today's Visit #: 12 Starting Date: 09/04/22     ASSESSMENT AND PLAN:  Diet: Hannah Cain is currently in the action stage of change. As such, her goal is to continue with weight loss efforts. She has agreed to Category 1 Plan.  Exercise: Hannah Cain has been instructed to work up to a goal of 150 minutes of combined cardio and strengthening exercise per week for weight loss and overall health benefits.   Behavior Modification:  We discussed the following Behavioral Modification Strategies today: increasing lean protein intake, decreasing simple carbohydrates, increasing vegetables, increase H2O intake, increase high fiber foods, meal planning and cooking strategies, avoiding temptations, and planning for success. We discussed various medication options to help Hannah Cain with her weight loss efforts and we both agreed to continue current treatment plan, continue to work  on nutritional and behavioral strategies to promote weight loss.  .  No follow-ups on file.SABRA She was informed of the importance of frequent follow up visits to maximize her success with intensive lifestyle modifications for her multiple health conditions.  Attestation Statements:   Reviewed by clinician on day of visit: allergies, medications, problem list, medical history, surgical history, family history, social history, and previous encounter notes.   Time spent on visit including pre-visit chart review and post-visit care and charting was 29  minutes.    Dillan Candela, PA-C

## 2023-10-02 ENCOUNTER — Encounter: Payer: Self-pay | Admitting: Internal Medicine

## 2023-10-02 ENCOUNTER — Ambulatory Visit: Admitting: Internal Medicine

## 2023-10-02 VITALS — BP 122/84 | HR 65 | Temp 98.6°F | Ht 67.0 in | Wt 237.0 lb

## 2023-10-02 DIAGNOSIS — E559 Vitamin D deficiency, unspecified: Secondary | ICD-10-CM | POA: Diagnosis not present

## 2023-10-02 DIAGNOSIS — E538 Deficiency of other specified B group vitamins: Secondary | ICD-10-CM

## 2023-10-02 DIAGNOSIS — I889 Nonspecific lymphadenitis, unspecified: Secondary | ICD-10-CM | POA: Diagnosis not present

## 2023-10-02 DIAGNOSIS — I10 Essential (primary) hypertension: Secondary | ICD-10-CM

## 2023-10-02 MED ORDER — AZITHROMYCIN 250 MG PO TABS: ORAL_TABLET | ORAL | 1 refills | Status: AC

## 2023-10-02 NOTE — Progress Notes (Signed)
 Patient ID: Hannah Cain, female   DOB: 09/27/1970, 53 y.o.   MRN: [id number]        Chief Complaint: follow up tender lump left mid neck, low vit d, htn, b12 deficiency       HPI:  Hannah Cain is a 53 y.o. female here with c/o tender swelling lump to mid left neck, no overlying skin change.  No high fever, chills,  No other ENT symptoms or pain.  Pt denies chest pain, increased sob or doe, wheezing, orthopnea, PND, increased LE swelling, palpitations, dizziness or syncope.   Pt denies polydipsia, polyuria, or new focal neuro s/s.           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 She has never used smokeless tobacco. She reports that she does not drink alcohol and does not use drugs. family history includes Breast cancer in her maternal grandmother; COPD in her maternal grandmother; Colon cancer (age of onset: 69) in her brother; Colon polyps in her maternal grandmother; Heart failure in her maternal grandmother; Hyperlipidemia in her maternal grandmother; Hypertension in her maternal grandmother and mother; Hypothyroidism in her mother; Liver cancer in her brother; Obesity in her father;  Stroke in her maternal grandmother; Thyroid  disease in her mother; Vitiligo in her mother. No Known Allergies Current Outpatient Medications on File Prior to Visit  Medication Sig Dispense Refill   Cholecalciferol (VITAMIN D3) 125 MCG (5000 UT) CAPS Take 1 capsule (5,000 Units total) by mouth daily. 30 capsule 0   cyanocobalamin  (VITAMIN B12) 1000 MCG/ML injection Inject 1 mL (1,000 mcg total) into the muscle every 30 (thirty) days. 3 mL 3   EPIPEN 2-PAK 0.3 MG/0.3ML SOAJ injection as directed Injection as directed for 30 days     estradiol (VIVELLE-DOT) 0.075 MG/24HR Place 1 patch onto the skin 2 (two) times a week.     Fexofenadine HCl (ALLEGRA ALLERGY PO) Take by mouth.     gabapentin  (NEURONTIN ) 100 MG capsule Take 1 tablet at bedtime x 1 week, then increase to 1 tablet twice daily. 60 capsule 3   Lifitegrast (XIIDRA) 5 % SOLN Apply to eye.     meloxicam  (MOBIC ) 15 MG tablet Take 1 tablet (15 mg total) by mouth daily as needed for pain. 30 tablet 0   metFORMIN  (GLUCOPHAGE ) 500 MG tablet Take 1 tablet (500 mg total) by mouth daily. 60 tablet 1   Multiple Vitamin (MULTIVITAMIN) tablet Take 1 tablet  by mouth daily.     norethindrone (MICRONOR) 0.35 MG tablet Take 1 tablet by mouth daily.     RESTASIS 0.05 % ophthalmic emulsion 1 drop 2 (two) times daily.     No current facility-administered medications on file prior to visit.        ROS:  All others reviewed and negative.  Objective        PE:  BP 122/84 (BP Location: Right Arm, Patient Position: Sitting, Cuff Size: Normal)   Pulse 65   Temp 98.6 F (37 C) (Oral)   Ht 5' 7 (1.702 m)   Wt 237 lb (107.5 kg)   SpO2 98%   BMI 37.12 kg/m                 Constitutional: Pt appears in NAD               HENT: Head: NCAT.                Right Ear: External ear normal.                 Left Ear: External ear normal.                Eyes: . Pupils are equal, round, and reactive to light. Conjunctivae and EOM are normal               Nose:  without d/c or ; mouth without lesion under tongue or other               Neck: Neck supple. Gross normal ROM; left mid neck with mild tender subcutaneous lump firm mobile, no other LA or tenderness               Cardiovascular: Normal rate and regular rhythm.                 Pulmonary/Chest: Effort normal and breath sounds without rales or wheezing.                           Neurological: Pt is alert. At baseline orientation, motor grossly intact               Skin: Skin is warm. No rashes, no other new lesions, LE edema - none               Psychiatric: Pt behavior is normal without agitation   Micro: none  Cardiac tracings I have personally interpreted today:  none  Pertinent Radiological findings (summarize): none   Lab Results  Component Value Date   WBC 5.8 01/31/2023   HGB 13.5 01/31/2023   HCT 41.9 01/31/2023   PLT 204 01/31/2023   GLUCOSE 93 01/31/2023   CHOL 192 07/09/2023   TRIG 71.0 07/09/2023   HDL 60.80 07/09/2023   LDLCALC 117 (H) 07/09/2023   ALT 13 01/31/2023   AST 17 01/31/2023   NA 139 01/31/2023   K 4.6 01/31/2023   CL 104 01/31/2023   CREATININE 0.76 01/31/2023   BUN 16 01/31/2023   CO2 23 01/31/2023   TSH 3.36 07/09/2023   HGBA1C 5.3 07/09/2023   Assessment/Plan:  Hannah Cain is a 53 y.o. White or Caucasian [1] female with  has a past medical history of ALLERGIC RHINITIS (03/17/2007), Anemia, Arthritis, B12 deficiency, Back pain, Basal cell carcinoma (BCC) (06/10/2017), Constipation, GERD (gastroesophageal reflux disease), High blood pressure, HLD (hyperlipidemia) (06/10/2017), Joint pain, Palpitations (11/12/2017), Symptomatic PVCs (06/11/2018),  URI (07/19/2009), Vitamin B 12 deficiency, and Vitamin D  deficiency.  Vitamin D  deficiency Last vitamin D  Lab Results  Component Value Date   VD25OH 44.51 07/09/2023   Stable, cont oral replacement   Lymphadenitis Mild left mid neck, can't r/o infection, for antibx course zpack,  to f/u any worsening  symptoms or concerns  Hypertension BP Readings from Last 3 Encounters:  10/02/23 122/84  09/29/23 135/80  07/21/23 128/77   Stable, pt to continue medical treatment  - diet, wt control   B12 deficiency Lab Results  Component Value Date   VITAMINB12 1,097 01/31/2023   Stable, cont oral replacement - b12 1000 mcg qd  Followup: Return if symptoms worsen or fail to improve.  Lynwood Rush, MD 10/05/2023 4:02 PM [facility]

## 2023-10-02 NOTE — Patient Instructions (Signed)
 Please take all new medication as prescribed - the antibiotic  Please continue all other medications as before, and refills have been done if requested.  Please have the pharmacy call with any other refills you may need.  Please keep your appointments with your specialists as you may have planned  Please return if not improved or worsening in 1-2 weeks

## 2023-10-05 ENCOUNTER — Encounter: Payer: Self-pay | Admitting: Internal Medicine

## 2023-10-05 NOTE — Assessment & Plan Note (Signed)
 Lab Results  Component Value Date   VITAMINB12 1,097 01/31/2023   Stable, cont oral replacement - b12 1000 mcg qd

## 2023-10-05 NOTE — Assessment & Plan Note (Signed)
 BP Readings from Last 3 Encounters:  10/02/23 122/84  09/29/23 135/80  07/21/23 128/77   Stable, pt to continue medical treatment  - diet, wt control

## 2023-10-05 NOTE — Assessment & Plan Note (Signed)
 Mild left mid neck, can't r/o infection, for antibx course zpack,  to f/u any worsening symptoms or concerns

## 2023-10-05 NOTE — Assessment & Plan Note (Signed)
 Last vitamin D Lab Results  Component Value Date   VD25OH 44.51 07/09/2023   Stable, cont oral replacement

## 2023-10-07 ENCOUNTER — Ambulatory Visit: Admitting: Neurology

## 2023-10-07 ENCOUNTER — Encounter: Payer: Self-pay | Admitting: Neurology

## 2023-10-07 ENCOUNTER — Encounter: Payer: Self-pay | Admitting: Internal Medicine

## 2023-10-07 VITALS — BP 144/80 | HR 69 | Ht 67.0 in | Wt 240.0 lb

## 2023-10-07 DIAGNOSIS — R221 Localized swelling, mass and lump, neck: Secondary | ICD-10-CM

## 2023-10-07 DIAGNOSIS — R252 Cramp and spasm: Secondary | ICD-10-CM | POA: Diagnosis not present

## 2023-10-07 DIAGNOSIS — R200 Anesthesia of skin: Secondary | ICD-10-CM | POA: Diagnosis not present

## 2023-10-07 DIAGNOSIS — M5124 Other intervertebral disc displacement, thoracic region: Secondary | ICD-10-CM | POA: Diagnosis not present

## 2023-10-07 NOTE — Progress Notes (Signed)
 Follow-up Visit   Date: 10/07/2023    Hannah Cain MRN: [id number] DOB: 1970/06/22    SHAKIARA LUKIC is a 53 y.o. right-handed Caucasian female returning to the clinic for follow-up of left leg numbness.  The patient was accompanied to the clinic by self.  IMPRESSION/PLAN: Left lower leg and foot numbness, improved with PT.  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   MRI lumbar spine without nerve impingement.  NCS/EMG of the leg and ABI are both normal. - Continue home exercises  Left leg muscle cramps - Recommend leg stretches and hydration.  She can also try tonic water or magnesium supplements   Return to clinic in 6 months  --------------------------------------------- History of present illness: She has numbness involving the left foot and lateral leg, which has been getting worse since April 2024.  There was no preceding injury.   Symptoms are constant in the lower leg and intermittent in the foot.  Symptoms are not worse with activity.  She denies low back pain or radicular leg pain. She has some weakness in the left leg.  She has completed PT with some benefit.     UPDATE 05/13/2023:  She is here for follow-up visit.  She continues to have numbness over the lateral lower leg which is constant.   She has pins and needles over the side of the left foot, which can occur once per day.  Tingling tends to start in the ankle and radiates up the leg. MRI lumbar spine and NCS/EMG has been normal. No associated weakness.   UPDATE 06/18/2023:  She reports less pins and needles sensation, which now occurs a few times over the past few weeks.  She continues to have numbness.  She is here to review MRI cervical spine and thoracic spine results.   UPDATE 10/07/2023:  She is here for 3 month follow-up visit.  She reports having marked improvement of pins and needles in the left leg with PT.  She now only has back pain with prolonged  standing or laying on her side.  She does get muscle cramps in the left leg almost nightly.    Medications:  Current Outpatient Medications on File Prior to Visit  Medication Sig Dispense Refill   azithromycin  (ZITHROMAX ) 250 MG tablet Take 2 tablets on day 1, then 1 tablet daily on days 2 through 5 6 tablet 1   Cholecalciferol (VITAMIN D3) 125 MCG (5000 UT) CAPS Take 1 capsule (5,000 Units total) by mouth daily. 30 capsule 0   cyanocobalamin  (VITAMIN B12) 1000 MCG/ML injection Inject 1 mL (1,000 mcg total) into the muscle every 30 (thirty) days. 3 mL 3   EPIPEN 2-PAK 0.3 MG/0.3ML SOAJ injection as directed Injection as directed for 30 days     estradiol (VIVELLE-DOT) 0.075 MG/24HR Place 1 patch onto the skin 2 (two) times a week.     Fexofenadine HCl (ALLEGRA ALLERGY PO) Take by mouth.     Lifitegrast (XIIDRA) 5 % SOLN Apply to eye.     meloxicam  (MOBIC ) 15 MG tablet Take 1 tablet (15 mg total) by mouth daily as needed for pain. 30 tablet 0   metFORMIN  (GLUCOPHAGE ) 500 MG tablet Take 1 tablet (500 mg total) by mouth daily. 60 tablet 1   Multiple Vitamin (MULTIVITAMIN) tablet Take 1 tablet by mouth daily.     RESTASIS 0.05 % ophthalmic emulsion 1 drop 2 (two) times daily.  gabapentin  (NEURONTIN ) 100 MG capsule Take 1 tablet at bedtime x 1 week, then increase to 1 tablet twice daily. (Patient not taking: Reported on 10/07/2023) 60 capsule 3   norethindrone (MICRONOR) 0.35 MG tablet Take 1 tablet by mouth daily. (Patient not taking: Reported on 10/07/2023)     No current facility-administered medications on file prior to visit.    Allergies: No Known Allergies  Vital Signs:  BP (!) 144/80   Pulse 69   Ht 5' 7 (1.702 m)   Wt 240 lb (108.9 kg)   SpO2 98%   BMI 37.59 kg/m     Neurological Exam: MENTAL STATUS including orientation to time, place, person, recent and remote memory, attention span and concentration, language, and fund of knowledge is normal.  Speech is not  dysarthric.  CRANIAL NERVES:  Pupils equal round and reactive to light.  Normal conjugate, extra-ocular eye movements in all directions of gaze.  No ptosis.  Face is symmetric.   MOTOR:  Motor strength is 5/5 in all extremities, including door dorsiflexion and toe extension.  No atrophy, fasciculations or abnormal movements.  No pronator drift.  Tone is normal.    MSRs:  Reflexes are 2+/4 throughout, except 3+/4 at the knees.  SENSORY:  Intact to vibration and temperature.  COORDINATION/GAIT:   Gait narrow based and stable.   Data: NCS/EMG of the left leg 05/02/2023: This is a normal study of the left lower extremity.  In particular, there is no evidence of a peroneal mononeuropathy, lumbosacral radiculopathy, or large fiber sensorimotor polyneuropathy.   MRI lumbar spine 03/26/2023: T12-L1: No significant disc bulge. No neural foraminal stenosis. No central canal stenosis.   L1-L2: Mild broad-based disc bulge. Mild bilateral facet arthropathy. No foraminal or central canal stenosis.   L2-L3: Mild disc bulge. Mild bilateral facet arthropathy. No right foraminal stenosis. Mild left foraminal stenosis. No central canal stenosis.   L3-L4: Mild broad-based disc bulge. Moderate bilateral facet arthropathy. No central canal stenosis. Mild left foraminal stenosis. No right foraminal stenosis.   L4-L5: Mild disc bulge. Mild bilateral facet arthropathy. No foraminal or central canal stenosis.   L5-S1: Mild disc bulge with a small central disc protrusion. Moderate bilateral facet arthropathy. Mild bilateral foraminal stenosis. No central canal stenosis.   IMPRESSION: 1. Mild lumbar spine spondylosis as described above. 2. No acute osseous injury of the lumbar spine.  MRI thoracic spine wo contrast 05/24/2023: Small central, left paracentral disc herniation T7-T8 flattening the left lateral aspect of the cord.   MRI cervical spine wo contrast 05/24/2023: Mild degenerative disc disease  at C4-C5 and C5-C6 with small bulging discs without significant central canal or neural foraminal narrowing.  ABI 05/16/2023:  Normal     Thank you for allowing me to participate in patient's care.  If I can answer any additional questions, I would be pleased to do so.    Sincerely,    Yari Szeliga K. Tobie, DO

## 2023-10-07 NOTE — Patient Instructions (Signed)
 For cramps, recommend leg stretches before sleeping.   If severe, you may also try tonic water and/or magnesium supplements  Continue home PT exercises

## 2023-10-18 ENCOUNTER — Inpatient Hospital Stay
Admission: RE | Admit: 2023-10-18 | Discharge: 2023-10-18 | Discharge status: Home / Self Care | Attending: Emergency Medicine

## 2023-10-18 VITALS — BP 136/84 | HR 73 | Temp 97.8°F | Resp 18

## 2023-10-18 DIAGNOSIS — B029 Zoster without complications: Secondary | ICD-10-CM | POA: Diagnosis not present

## 2023-10-18 MED ORDER — VALACYCLOVIR HCL 1 G PO TABS: 1000.0000 mg | ORAL_TABLET | Freq: Three times a day (TID) | ORAL | 0 refills | Status: DC

## 2023-10-18 NOTE — ED Triage Notes (Signed)
 Patient to Urgent Care with complaints of rash present to her right shoulder. Hx of shingles- states this feels the same.  Symptoms started one week ago. Started w/ lymph node swelling on same side.

## 2023-10-18 NOTE — Discharge Instructions (Addendum)
 Take the Valtrex  as directed.  Take Tylenol  or ibuprofen as needed for discomfort.  Follow up with your primary care provider on Monday.  Go to the emergency department if you have worsening symptoms.

## 2023-10-18 NOTE — ED Provider Notes (Signed)
 [facility]    CSN: [id number] Arrival date & time: 10/18/23  1027      History   Chief Complaint Chief Complaint  Patient presents with   Rash    Entered by patient    HPI NANCI LAKATOS is a 53 y.o. female.  Patient presents with 3 to 4-day history of painful rash on her right posterior shoulder.  She has history of shingles and and states this feels similar.  She has been treating the discomfort with Tylenol .  No fever or purulent drainage.  She states her pain is well-controlled with OTC medication.  The history is provided by the patient and medical records.    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   URI 07/19/2009   Vitamin B 12 deficiency    Vitamin D  deficiency     Patient Active Problem List   Diagnosis Date Noted   Lymphadenitis 10/02/2023   Insulin  resistance 02/20/2023   Slow transit constipation 02/20/2023   Poor sleep 02/20/2023   Stress 02/20/2023   Acute dysfunction of Eustachian tube, bilateral 02/01/2023   Dry eyes 10/25/2022   Osteoarthritis 10/25/2022   SOBOE (shortness of breath on exertion) 09/04/2022   Vitamin B 12 deficiency 09/04/2022   Heart palpitations 09/04/2022   Depression screen 09/04/2022   BMI 35.0-35.9,adult 09/04/2022   Picky eater 08/14/2022   DUB (dysfunctional uterine bleeding) 08/14/2022   Generalized obesity with inital BMI 36 08/13/2022   Left foot pain 07/14/2022   Obesity due to excess calories 05/02/2022   Skin nodule 05/02/2022   H/O lymphadenopathy 11/20/2021   Allergic rhinitis due to pollen 11/09/2021   Other adverse food reactions, not elsewhere  classified, initial encounter 11/09/2021   Other chronic allergic conjunctivitis 11/09/2021   Cough present for greater than 3 weeks 11/09/2021   Productive cough 11/09/2021   Enlarged lymph node 11/09/2021   Left shoulder pain 07/10/2021   Fatigue 06/30/2019   B12 deficiency 06/30/2019   Vitamin D  deficiency 06/30/2019   Paresthesia of left upper and lower extremity 12/31/2018   Hypertension 06/11/2018   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   Diarrhea 06/30/2017   Hypercholesteremia 06/10/2017   Basal cell carcinoma (BCC) 06/10/2017   Right ear pain 05/07/2017   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 Authorizing Provider  valACYclovir  (VALTREX ) 1000 MG tablet Take 1 tablet (1,000 mg total) by mouth 3 (three) times daily. 10/18/23  Yes Corlis Burnard DEL, NP  Cholecalciferol (VITAMIN D3) 125 MCG (5000 UT) CAPS Take 1 capsule (5,000 Units total) by mouth daily. 07/21/23   Rayburn, Elouise Phlegm, PA-C  cyanocobalamin  (VITAMIN B12) 1000 MCG/ML injection Inject 1 mL (1,000 mcg total) into the muscle every 30 (thirty) days. 07/14/23   Norleen Lynwood ORN, MD  EPIPEN 2-PAK 0.3 MG/0.3ML SOAJ injection as directed Injection as directed for 30 days 08/27/22   [provider]  estradiol (VIVELLE-DOT) 0.075 MG/24HR Place 1 patch onto the skin 2 (two) times a week. 09/24/23   [provider]  Fexofenadine HCl (ALLEGRA ALLERGY PO) Take by mouth.    [provider]  Lifitegrast CARON) 5 % SOLN Apply to eye.    [provider]  meloxicam  (MOBIC ) 15 MG tablet Take 1 tablet (15 mg total) by mouth daily as needed for pain. 09/10/23   Leonce Katz, DO  metFORMIN  (GLUCOPHAGE ) 500 MG tablet Take 1 tablet (500 mg total) by mouth daily. 09/29/23 01/27/24  Rayburn, Elouise Phlegm, PA-C  Multiple Vitamin (MULTIVITAMIN) tablet Take 1 tablet by mouth daily.    [provider]  norethindrone (MICRONOR) 0.35 MG tablet Take 1 tablet by mouth daily. Patient not taking: Reported on 10/07/2023    [provider]  RESTASIS 0.05 % ophthalmic emulsion 1 drop 2 (two) times daily. 03/27/23   [provider]    Family History Family History  Problem Relation Age of Onset   Thyroid  disease Mother    Hypertension Mother    Hypothyroidism Mother    Vitiligo Mother    Obesity Father    Colon cancer Brother 76       mets to liver   Liver cancer Brother    Breast cancer Maternal Grandmother    Colon polyps Maternal Grandmother    Stroke Maternal Grandmother    Hypertension Maternal Grandmother    Hyperlipidemia Maternal Grandmother    COPD Maternal Grandmother    Heart failure Maternal Grandmother    Rectal cancer Neg Hx    Stomach cancer Neg Hx     Social History Social History   Tobacco Use   Smoking status: Never   Smokeless tobacco: Never  Vaping Use   Vaping status: Never Used  Substance Use Topics   Alcohol use: No   Drug use: No     Allergies   Patient has no known allergies.   Review of Systems Review of Systems  Constitutional:  Negative for chills and fever.  HENT:  Negative for mouth sores and sore throat.   Eyes:  Negative for pain, discharge, redness and visual disturbance.  Skin:  Positive for color change and rash.     Physical Exam Triage Vital Signs ED Triage Vitals  Encounter Vitals Group      BP      Girls Systolic BP Percentile      Girls Diastolic BP Percentile      Boys Systolic BP Percentile      Boys Diastolic BP Percentile      Pulse      Resp      Temp      Temp src  SpO2      Weight      Height      Head Circumference      Peak Flow      Pain Score      Pain Loc      Pain Education      Exclude from Growth Chart    No data found.  Updated Vital Signs BP 136/84   Pulse 73   Temp 97.8 F (36.6 C)   Resp 18   LMP 08/04/2021 (Approximate)   SpO2 97%   Visual Acuity Right Eye Distance:   Left Eye Distance:   Bilateral Distance:    Right Eye Near:   Left Eye Near:    Bilateral Near:     Physical Exam Constitutional:      General: She is not in acute distress. HENT:     Mouth/Throat:     Mouth: Mucous membranes are moist.  Cardiovascular:     Rate and Rhythm: Normal rate and regular rhythm.  Pulmonary:     Effort: Pulmonary effort is normal. No respiratory distress.  Skin:    General: Skin is warm and dry.     Findings: Rash present.     Comments: Tender erythematous patchy vesicular rash on right upper back.  No drainage.  Neurological:     Mental Status: She is alert.      UC Treatments / Results  Labs (all labs ordered are listed, but only abnormal results are displayed) Labs Reviewed - No data to display  EKG   Radiology No results found.  Procedures Procedures (including critical care time)  Medications Ordered in UC Medications - No data to display  Initial Impression / Assessment and Plan / UC Course  I have reviewed the triage vital signs and the nursing notes.  Pertinent labs & imaging results that were available during my care of the patient were reviewed by me and considered in my medical decision making (see chart for details).    Herpes zoster.  Afebrile and vital signs are stable.  Patient reports her pain is well-controlled with OTC medication.  Treating today with Valtrex .  Education provided on  shingles.  Instructed patient to follow-up with her PCP on Monday.  ED precautions given.  She agrees to plan of care.  Final Clinical Impressions(s) / UC Diagnoses   Final diagnoses:  Herpes zoster without complication     Discharge Instructions      Take the Valtrex  as directed.  Take Tylenol  or ibuprofen as needed for discomfort.  Follow up with your primary care provider on Monday.  Go to the emergency department if you have worsening symptoms.        ED Prescriptions     Medication Sig Dispense Auth. Provider   valACYclovir  (VALTREX ) 1000 MG tablet Take 1 tablet (1,000 mg total) by mouth 3 (three) times daily. 21 tablet Corlis Burnard DEL, NP      PDMP not reviewed this encounter.   Corlis Burnard DEL, NP 10/18/23 1052

## 2023-10-20 ENCOUNTER — Encounter: Payer: Self-pay | Admitting: Internal Medicine

## 2023-11-03 ENCOUNTER — Encounter: Payer: Self-pay | Admitting: Family Medicine

## 2023-11-03 ENCOUNTER — Ambulatory Visit (INDEPENDENT_AMBULATORY_CARE_PROVIDER_SITE_OTHER): Admitting: Family Medicine

## 2023-11-03 ENCOUNTER — Ambulatory Visit: Payer: Self-pay

## 2023-11-03 VITALS — BP 136/78 | HR 74 | Temp 98.6°F | Resp 18 | Ht 67.0 in | Wt 242.0 lb

## 2023-11-03 DIAGNOSIS — I10 Essential (primary) hypertension: Secondary | ICD-10-CM | POA: Insufficient documentation

## 2023-11-03 DIAGNOSIS — W57XXXA Bitten or stung by nonvenomous insect and other nonvenomous arthropods, initial encounter: Secondary | ICD-10-CM

## 2023-11-03 DIAGNOSIS — S40861A Insect bite (nonvenomous) of right upper arm, initial encounter: Secondary | ICD-10-CM

## 2023-11-03 DIAGNOSIS — Z7984 Long term (current) use of oral hypoglycemic drugs: Secondary | ICD-10-CM | POA: Diagnosis not present

## 2023-11-03 DIAGNOSIS — T7840XA Allergy, unspecified, initial encounter: Secondary | ICD-10-CM | POA: Diagnosis present

## 2023-11-03 DIAGNOSIS — Z85828 Personal history of other malignant neoplasm of skin: Secondary | ICD-10-CM | POA: Insufficient documentation

## 2023-11-03 MED ORDER — DOXYCYCLINE HYCLATE 100 MG PO TABS: 100.0000 mg | ORAL_TABLET | Freq: Two times a day (BID) | ORAL | 0 refills | Status: AC

## 2023-11-03 NOTE — Progress Notes (Signed)
 Assessment & Plan:  1. Insect bite of right upper arm, initial encounter (Primary) Education provided on insect bites.  Encouraged cool compresses and cooler showers.  Continue antihistamine daily for itching.  Caution regarding the sun while taking doxycycline . - doxycycline  (VIBRA -TABS) 100 MG tablet; Take 1 tablet (100 mg total) by mouth 2 (two) times daily for 7 days.  Dispense: 14 tablet; Refill: 0   Follow up plan: Return if symptoms worsen or fail to improve.  Niki Rung, MSN, APRN, FNP-C  Subjective:  HPI: Hannah Cain is a 53 y.o. female presenting on 11/03/2023 for Insect Bite (Noticed last night about 6pm - red, heat, getting larger, tender, swollen. /Right arm - unsure of type of bite)  Patient reports she was outside pulling weeds yesterday evening when she experienced a bite to her right arm.  She is unsure what bit her but states it was painful.  Her arm has since turned red, swollen, pruritic, and is warm and tender to the touch.  She has taken Benadryl  and applied ice.  She does take an Allegra daily.   ROS: Negative unless specifically indicated above in HPI.   Relevant past medical history reviewed and updated as indicated.   Allergies and medications reviewed and updated.   Current Outpatient Medications:    Cholecalciferol (VITAMIN D3) 125 MCG (5000 UT) CAPS, Take 1 capsule (5,000 Units total) by mouth daily., Disp: 30 capsule, Rfl: 0   cyanocobalamin  (VITAMIN B12) 1000 MCG/ML injection, Inject 1 mL (1,000 mcg total) into the muscle every 30 (thirty) days., Disp: 3 mL, Rfl: 3   EPIPEN  2-PAK 0.3 MG/0.3ML SOAJ injection, as directed Injection as directed for 30 days, Disp: , Rfl:    estradiol (VIVELLE-DOT) 0.075 MG/24HR, Place 1 patch onto the skin 2 (two) times a week., Disp: , Rfl:    Fexofenadine HCl (ALLEGRA ALLERGY PO), Take by mouth., Disp: , Rfl:    meloxicam  (MOBIC ) 15 MG tablet, Take 1 tablet (15 mg total) by mouth daily as needed for pain., Disp: 30  tablet, Rfl: 0   metFORMIN  (GLUCOPHAGE ) 500 MG tablet, Take 1 tablet (500 mg total) by mouth daily., Disp: 60 tablet, Rfl: 1   Multiple Vitamin (MULTIVITAMIN) tablet, Take 1 tablet by mouth daily., Disp: , Rfl:    RESTASIS 0.05 % ophthalmic emulsion, 1 drop 2 (two) times daily., Disp: , Rfl:   No Known Allergies  Objective:   BP 136/78   Pulse 74   Temp 98.6 F (37 C)   Resp 18   Ht 5' 7 (1.702 m)   Wt 242 lb (109.8 kg)   LMP 08/04/2021 (Approximate)   SpO2 97%   BMI 37.90 kg/m    Physical Exam Vitals reviewed.  Constitutional:      General: She is not in acute distress.    Appearance: Normal appearance. She is not ill-appearing, toxic-appearing or diaphoretic.  HENT:     Head: Normocephalic and atraumatic.  Eyes:     General: No scleral icterus.       Right eye: No discharge.        Left eye: No discharge.     Conjunctiva/sclera: Conjunctivae normal.  Cardiovascular:     Rate and Rhythm: Normal rate.  Pulmonary:     Effort: Pulmonary effort is normal. No respiratory distress.  Musculoskeletal:        General: Normal range of motion.     Cervical back: Normal range of motion.  Skin:    General: Skin is  warm and dry.     Capillary Refill: Capillary refill takes less than 2 seconds.     Comments: Bite with small blister in right AC with surrounding erythema, swelling, and warmth (picture below).  Neurological:     General: No focal deficit present.     Mental Status: She is alert and oriented to person, place, and time. Mental status is at baseline.  Psychiatric:        Mood and Affect: Mood normal.        Behavior: Behavior normal.        Thought Content: Thought content normal.        Judgment: Judgment normal.

## 2023-11-03 NOTE — Telephone Encounter (Signed)
 FYI Only or Action Required?: Action required by provider: request for appointment.  Patient was last seen in primary care on 10/02/2023 by Norleen Lynwood ORN, MD.  Called Nurse Triage reporting Arm Swelling.  Symptoms began yesterday.  Interventions attempted: OTC medications: Benadryl .  Symptoms are: gradually worsening.Bug bite Sunday. Elbow and down forearm swollen, red, painful. Too benadryl .  Triage Disposition: See Physician Within 24 Hours  Patient/caregiver understands and will follow disposition?: Yes   Copied from CRM #[id number]. Topic: Clinical - Red Word Triage >> Nov 03, 2023  7:50 AM Hannah Cain wrote: Red Word that prompted transfer to Nurse Triage: Patient has a bite on arm, red, swollen , and painful Reason for Disposition  MODERATE arm swelling (e.g., puffiness or swollen feeling of entire arm)  Answer Assessment - Initial Assessment Questions 1. ONSET: When did the swelling start? (e.g., minutes, hours, days)     Sunday 2. LOCATION: What part of the arm is swollen?  Are both arms swollen or just one arm?     Right 3. SEVERITY: How bad is the swelling? (e.g., localized; mild, moderate, severe)     Elbow down to forearm 4. REDNESS: Is there redness or signs of infection?     yes 5. PAIN: Is the swelling painful to touch? If Yes, ask: How painful is it?   (Scale 1-10; mild, moderate or severe)     Now - 4 6. FEVER: Do you have a fever? If Yes, ask: What is it, how was it measured, and when did it start?      no 7. CAUSE: What do you think is causing the arm swelling?     Bug bite 8. MEDICAL HISTORY: Do you have a history of heart failure, kidney disease, liver failure, or cancer?     no 9. RECURRENT SYMPTOM: Have you had arm swelling before? If Yes, ask: When was the last time? What happened that time?     no 10. OTHER SYMPTOMS: Do you have any other symptoms? (e.g., chest pain, difficulty breathing)       no 11. PREGNANCY: Is there  any chance you are pregnant? When was your last menstrual period?       no  Protocols used: Arm Swelling and Edema-A-AH

## 2023-11-04 ENCOUNTER — Emergency Department
Admission: EM | Admit: 2023-11-04 | Discharge: 2023-11-04 | Disposition: A | Discharge status: Home / Self Care | Attending: Emergency Medicine | Admitting: Emergency Medicine

## 2023-11-04 ENCOUNTER — Other Ambulatory Visit: Payer: Self-pay

## 2023-11-04 DIAGNOSIS — T7840XA Allergy, unspecified, initial encounter: Secondary | ICD-10-CM

## 2023-11-04 LAB — CBG MONITORING, ED: Glucose-Capillary: 82 mg/dL (ref 70–99)

## 2023-11-04 MED ORDER — TRIAMCINOLONE ACETONIDE 0.5 % EX OINT: 1.0000 | TOPICAL_OINTMENT | Freq: Two times a day (BID) | CUTANEOUS | 0 refills | Status: DC

## 2023-11-04 MED ORDER — DIPHENHYDRAMINE HCL 50 MG PO TABS: 50.0000 mg | ORAL_TABLET | Freq: Three times a day (TID) | ORAL | 0 refills | Status: DC | PRN

## 2023-11-04 MED ORDER — METHYLPREDNISOLONE SODIUM SUCC 125 MG IJ SOLR
125.0000 mg | Freq: Once | INTRAMUSCULAR | Status: AC
  Administered 2023-11-04: 125 mg via INTRAVENOUS
  Filled 2023-11-04: qty 2

## 2023-11-04 MED ORDER — EPINEPHRINE 0.3 MG/0.3ML IJ SOAJ: 0.3000 mg | INTRAMUSCULAR | 2 refills | Status: AC | PRN

## 2023-11-04 MED ORDER — DIPHENHYDRAMINE HCL 50 MG/ML IJ SOLN
50.0000 mg | Freq: Once | INTRAMUSCULAR | Status: AC
  Administered 2023-11-04: 50 mg via INTRAVENOUS
  Filled 2023-11-04: qty 1

## 2023-11-04 MED ORDER — SODIUM CHLORIDE 0.9 % IV BOLUS (SEPSIS)
1000.0000 mL | Freq: Once | INTRAVENOUS | Status: AC
  Administered 2023-11-04: 1000 mL via INTRAVENOUS

## 2023-11-04 MED ORDER — PREDNISONE 10 MG (21) PO TBPK: ORAL_TABLET | ORAL | 0 refills | Status: DC

## 2023-11-04 MED ORDER — KETOROLAC TROMETHAMINE 30 MG/ML IJ SOLN
30.0000 mg | Freq: Once | INTRAMUSCULAR | Status: AC
  Administered 2023-11-04: 30 mg via INTRAVENOUS
  Filled 2023-11-04: qty 1

## 2023-11-04 NOTE — ED Provider Notes (Signed)
 [facility] Provider Note    Event Date/Time   First MD Initiated Contact with Patient 11/04/23 0118     (approximate)   History   Insect Bite   HPI  Hannah Cain is a 53 y.o. female with history of hyperlipidemia, symptomatic PVCs, GERD who presents to the emergency department with possible insect bite/sting to the right upper extremity that occurred yesterday.  Patient states that she was working in her yard pulling a vine off of another plant when she felt something bite or sting her in the right antecubital fossa.  She does not think that she was bit by a snake.  She did not see the insect but had immediate pain.  She states over the course of the next 24 hours the area has become more swollen, painful, pruritic.  She was seen by another physician and started on doxycycline  for potential cellulitis.  She has taken 2 doses of this medication.  She denies any fevers, chills.  No lip or tongue swelling, difficulty breathing.  No other new exposures.   History provided by patient.    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 Authorizing Provider  Cholecalciferol (VITAMIN D3) 125 MCG (5000 UT) CAPS Take 1 capsule (5,000 Units total) by mouth daily. 07/21/23   Rayburn, Elouise Phlegm, PA-C   cyanocobalamin  (VITAMIN B12) 1000 MCG/ML injection Inject 1 mL (1,000 mcg total) into the muscle every 30 (thirty) days. 07/14/23   Norleen Lynwood ORN, MD  doxycycline  (VIBRA -TABS) 100 MG tablet Take 1 tablet (100 mg total) by mouth 2 (two) times daily for 7 days. 11/03/23 11/10/23  [name], FNP  EPIPEN  2-PAK 0.3 MG/0.3ML SOAJ injection as directed Injection as directed for 30 days 08/27/22   [provider]  estradiol (VIVELLE-DOT) 0.075 MG/24HR Place 1 patch onto the skin 2 (two) times a week. 09/24/23   [provider]  Fexofenadine HCl (ALLEGRA ALLERGY PO) Take by mouth.    [provider]  meloxicam  (MOBIC ) 15 MG tablet Take 1 tablet (15 mg total) by mouth daily as needed for pain. 09/10/23   Leonce Katz, DO  metFORMIN  (GLUCOPHAGE ) 500 MG tablet Take 1 tablet (500 mg total) by mouth daily. 09/29/23 01/27/24  Rayburn, Elouise Phlegm, PA-C  Multiple Vitamin (MULTIVITAMIN) tablet Take 1 tablet by mouth daily.    [provider]  RESTASIS 0.05 % ophthalmic emulsion 1 drop 2 (two) times daily. 03/27/23   [provider]    Physical Exam   Triage Vital Signs: ED Triage Vitals  Encounter Vitals Group     BP 11/04/23 0004 (!) 150/87     Girls  Systolic BP Percentile --      Girls Diastolic BP Percentile --      Boys Systolic BP Percentile --      Boys Diastolic BP Percentile --      Pulse Rate 11/04/23 0004 83     Resp 11/04/23 0004 17     Temp 11/04/23 0004 98.6 F (37 C)     Temp src --      SpO2 11/04/23 0004 100 %     Weight 11/04/23 0003 240 lb (108.9 kg)     Height 11/04/23 0003 5' 7 (1.702 m)     Head Circumference --      Peak Flow --      Pain Score 11/04/23 0003 5     Pain Loc --      Pain Education --      Exclude from Growth Chart --     Most recent vital signs: Vitals:   11/04/23 0220 11/04/23 0400  BP: (!) 168/90 138/74  Pulse: 89 84  Resp: 20 20  Temp:  98.7 F (37.1 C)  SpO2: 100% 96%    CONSTITUTIONAL:  Alert, responds appropriately to questions. Well-appearing; well-nourished HEAD: Normocephalic, atraumatic EYES: Conjunctivae clear, pupils appear equal, sclera nonicteric ENT: normal nose; moist mucous membranes, normal phonation, no stridor, no trismus or drooling, no angioedema NECK: Supple, normal ROM CARD: RRR; S1 and S2 appreciated RESP: Normal chest excursion without splinting or tachypnea; breath sounds clear and equal bilaterally; no wheezes, no rhonchi, no rales, no hypoxia or respiratory distress, speaking full sentences ABD/GI: Non-distended; soft, non-tender, no rebound, no guarding, no peritoneal signs BACK: The back appears normal EXT: Patient has large area of redness, warmth and soft tissue swelling noted to the right arm but compartments are soft and there is no fluctuance.  She does have 1 small pustular lesion without any drainage to the center of this area of redness just at the antecubital fossa.  She has full range of motion in the right elbow.  Clinically no signs of bursitis.  She has a 2+ right radial pulse.  Normal capillary refill and normal sensation. SKIN: Normal color for age and race; warm; no rash on exposed skin NEURO: Moves all extremities equally, normal speech PSYCH: The patient's mood and manner are appropriate.    RIGHT upper extremity  Patient gave verbal permission to utilize photo for medical documentation only. The image was not stored on any personal device.  ED Results / Procedures / Treatments   LABS: (all labs ordered are listed, but only abnormal results are displayed) Labs Reviewed  CBG MONITORING, ED     EKG:  EKG Interpretation Date/Time:  Tuesday November 04 2023 02:45:31 EDT Ventricular Rate:  89 PR Interval:  156 QRS Duration:  88 QT Interval:  380 QTC Calculation: 462 R Axis:   55  Text Interpretation: Normal sinus rhythm Normal ECG When compared with ECG of 11-Mar-2000 01:43, Questionable change in QRS duration Confirmed by  Neomi Neptune [phone]) on 11/04/2023 2:57:00 AM         RADIOLOGY: My personal review and interpretation of imaging:    I have personally reviewed all radiology reports.   No results found.   PROCEDURES:  Critical Care performed: No     .1-3 Lead EKG Interpretation  Performed by: Caleah Tortorelli, Neptune SAILOR, DO Authorized by: Yuvraj Pfeifer, Neptune SAILOR, DO     Interpretation: normal     ECG rate:  84   ECG rate assessment: normal  Rhythm: sinus rhythm     Ectopy: none     Conduction: normal       IMPRESSION / MDM / ASSESSMENT AND PLAN / ED COURSE  I reviewed the triage vital signs and the nursing notes.    Patient here with allergic reaction likely from insect bite/sting to the right upper extremity.  The patient is on the cardiac monitor to evaluate for evidence of arrhythmia and/or significant heart rate changes.   DIFFERENTIAL DIAGNOSIS (includes but not limited to):   Allergic reaction, less likely cellulitis, no sign of abscess, currently no sign of compartment syndrome, doubt septic arthritis or gout   Patient's presentation is most consistent with acute complicated illness / injury requiring diagnostic workup.   PLAN: Will give IV Benadryl , Toradol , Solu-Medrol  for symptomatic relief.  No systemic symptoms currently.  Low suspicion for infection.  She is already on doxycycline .   MEDICATIONS GIVEN IN ED: Medications  ketorolac  (TORADOL ) 30 MG/ML injection 30 mg (30 mg Intravenous Given 11/04/23 0158)  diphenhydrAMINE  (BENADRYL ) injection 50 mg (50 mg Intravenous Given 11/04/23 0157)  methylPREDNISolone  sodium succinate (SOLU-MEDROL ) 125 mg/2 mL injection 125 mg (125 mg Intravenous Given 11/04/23 0158)  sodium chloride  0.9 % bolus 1,000 mL (0 mLs Intravenous Stopped 11/04/23 0405)     ED COURSE: After receiving Toradol  patient became very lightheaded, diaphoretic and did not feel well.  No signs of allergic reaction.  Suspect adverse reaction.  Will place this in her  allergy list.  Will give additional IV fluids, check EKG, CBG.   Blood pressure is normal.  CBG 82.  She is eating and drinking.  EKG shows no interval changes, arrhythmia, ischemia.  Will continue to monitor.   Patient reports feeling better.  Redness and swelling in her right upper extremity have improved slightly.  Recommended continued Benadryl  use, elevation, ice and will discharge with prednisone  taper.  She will continue her doxycycline  as prescribed in case there is any infectious component.  Will also prescribe her an EpiPen  given how significant this area of swelling is.  She remains neurovascular intact distally with soft compartments.  She is on Allegra daily and has previously seen an allergy specialist.  Recommended follow-up with them again.  At this time, I do not feel there is any life-threatening condition present. I reviewed all nursing notes, vitals, pertinent previous records.  All lab and urine results, EKGs, imaging ordered have been independently reviewed and interpreted by myself.  I reviewed all available radiology reports from any imaging ordered this visit.  Based on my assessment, I feel the patient is safe to be discharged home without further emergent workup and can continue workup as an outpatient as needed. Discussed all findings, treatment plan as well as usual and customary return precautions.  They verbalize understanding and are comfortable with this plan.  Outpatient follow-up has been provided as needed.  All questions have been answered.    CONSULTS:  none   OUTSIDE RECORDS REVIEWED: Reviewed last primary care note yesterday.       FINAL CLINICAL IMPRESSION(S) / ED DIAGNOSES   Final diagnoses:  Allergic reaction, initial encounter     Rx / DC Orders   ED Discharge Orders          Ordered    diphenhydrAMINE  (BENADRYL ) 50 MG tablet  Every 8 hours PRN        11/04/23 0413    predniSONE  (STERAPRED UNI-PAK 21 TAB) 10 MG (21) TBPK tablet  11/04/23 0413    triamcinolone  ointment (KENALOG ) 0.5 %  2 times daily        11/04/23 0413    EPINEPHrine  0.3 mg/0.3 mL IJ SOAJ injection  As needed        11/04/23 0421             Note:  This document was prepared using Dragon voice recognition software and may include unintentional dictation errors.   Ariela Mochizuki, Josette SAILOR, DO 11/04/23 [phone]

## 2023-11-04 NOTE — ED Notes (Signed)
 Patient given 8oz OJ due to BGL

## 2023-11-04 NOTE — ED Notes (Signed)
 Patient given discharge instructions including prescriptions x4 and importance of follow up appt as needed with stated understanding. INT removed, cannula intact, pressure dressing applied. Patient stable and ambulatory with steady even gait on dispo.

## 2023-11-04 NOTE — ED Triage Notes (Signed)
 Pt reports she was bitten by an unknown bug yesterday while she was outside, pt has large area of redness and swelling to right arm. Pt was seen at PCP today for same and given rx for Doxy. Pt reports she has taken 2 doses of abx.

## 2023-11-04 NOTE — ED Notes (Signed)
 See triage note. Pt with large erythematous patch to anterior forearm and biceps of right arm. Small pustule noted just below AC space. Skin hot to the touch. Outline of erythematous area done at time of assessment using skin marker. Pt A&Ox4, NAD.

## 2023-11-05 ENCOUNTER — Ambulatory Visit: Payer: Self-pay

## 2023-11-05 ENCOUNTER — Other Ambulatory Visit: Payer: Self-pay | Admitting: Internal Medicine

## 2023-11-05 MED ORDER — HYDROXYZINE HCL 10 MG PO TABS: 10.0000 mg | ORAL_TABLET | Freq: Three times a day (TID) | ORAL | 0 refills | Status: DC | PRN

## 2023-11-05 NOTE — Telephone Encounter (Signed)
 FYI Only or Action Required?: Action required by provider: request for appointment.  Patient was last seen in primary care on 11/03/2023 by Hannah Niki FALCON, FNP.  Called Nurse Triage reporting Insect Bite.  Symptoms began several days ago.  Interventions attempted: Prescription medications: steroid cream.  Symptoms are: gradually improving.  Triage Disposition: See PCP When Office is Open (Within 3 Days)  Patient/caregiver understands and will follow disposition?: YesCopied from CRM [id number]. Topic: Clinical - Red Word Triage >> Nov 05, 2023 11:19 AM Adelita E wrote: [facility] that prompted transfer to Nurse Triage: Patient seen in ED yesterday for bite, itching and burning getting worse, but swelling is decreasing. Reason for Disposition  [1] SEVERE local itching (e.g., interferes with work, school, sleep) AND [2] not improved after 24 hours of hydrocortisone cream  Answer Assessment - Initial Assessment Questions 1. TYPE of INSECT: What type of insect was it?      Not sure 2. ONSET: When did you get bitten?      Sunday 3. LOCATION: Where is the insect bite located?      Right elbow-inside 4. REDNESS: Is the area red or pink? If Yes, ask: What size is the area of redness? (inches or cm). When did the redness start?     red 5. PAIN: Is there any pain? If Yes, ask: How bad is the pain? (Scale 0-10; or none, mild, moderate, severe)     Mild-moderate 6. ITCHING: Does it itch? If Yes, ask: How bad is the itch?      severer 7. SWELLING: How big is the swelling? (e.g., inches, cm, or compare to coins)     Yes swelling 8. OTHER SYMPTOMS: Do you have any other symptoms?  (e.g., difficulty breathing, fever, hives)    Denies all    Unknown bite from Sunday. Pt was seen in ED. Pt is asking if any medication can be called in to help with itching until 8/1 appt. Redness and swelling have improved but itching and burning are worse.  Please advise. Pt is asking to be  put on cancellation list for tomorrow.  Protocols used: Insect Bite-A-AH

## 2023-11-05 NOTE — Telephone Encounter (Signed)
 Ok for hydroxyzine  prn itching = done erx

## 2023-11-05 NOTE — Telephone Encounter (Signed)
 Spoke to patient, gave verbal understanding.

## 2023-11-07 ENCOUNTER — Ambulatory Visit (INDEPENDENT_AMBULATORY_CARE_PROVIDER_SITE_OTHER): Admitting: Internal Medicine

## 2023-11-07 ENCOUNTER — Encounter: Payer: Self-pay | Admitting: Internal Medicine

## 2023-11-07 VITALS — BP 154/88 | HR 73 | Temp 98.3°F | Ht 67.0 in | Wt 242.0 lb

## 2023-11-07 DIAGNOSIS — I10 Essential (primary) hypertension: Secondary | ICD-10-CM

## 2023-11-07 DIAGNOSIS — E559 Vitamin D deficiency, unspecified: Secondary | ICD-10-CM

## 2023-11-07 DIAGNOSIS — T783XXA Angioneurotic edema, initial encounter: Secondary | ICD-10-CM | POA: Insufficient documentation

## 2023-11-07 DIAGNOSIS — J309 Allergic rhinitis, unspecified: Secondary | ICD-10-CM

## 2023-11-07 DIAGNOSIS — T783XXD Angioneurotic edema, subsequent encounter: Secondary | ICD-10-CM | POA: Diagnosis not present

## 2023-11-07 MED ORDER — METHYLPREDNISOLONE ACETATE 40 MG/ML IJ SUSP
40.0000 mg | Freq: Once | INTRAMUSCULAR | Status: AC
  Administered 2023-11-07: 40 mg via INTRAMUSCULAR

## 2023-11-07 NOTE — Progress Notes (Signed)
 Patient ID: Hannah Cain, female   DOB: Apr 21, 1970, 53 y.o.   MRN: [id number]        Chief Complaint: follow up right arm angioedema, low vit d, htn, low b12       HPI:  Hannah Cain is a 53 y.o female here with c/o acute onset insect bite of some kind to right anterior arm just distal to the elbow with subsequent large near whole arm swelling to the wrist, severe to start, but no fever, chills, Was seen here at started doxycycline  (and still taking) but has not helped.  Seen at [facility] 3 days ago with steroid shot and prednisone , overall now itching and discomfort swelling over 50 % improved.  Pt denies chest pain, increased sob or doe, wheezing, orthopnea, PND, increased LE swelling, palpitations, dizziness or syncope.   Pt denies polydipsia, polyuria, or new focal neuro s/s.     Pt plans to see allergy soon by calling herself for appt.         Wt Readings from Last 3 Encounters:  11/07/23 242 lb (109.8 kg)  11/04/23 240 lb (108.9 kg)  11/03/23 242 lb (109.8 kg)   BP Readings from Last 3 Encounters:  11/07/23 (!) 154/88  11/04/23 138/74  11/03/23 136/78         Past Medical History:  Diagnosis Date   ALLERGIC RHINITIS 03/17/2007   Anemia    Arthritis    Right knee   B12 deficiency    Back pain    Basal cell carcinoma (BCC) 06/10/2017   Constipation    GERD (gastroesophageal reflux disease) 2022   High blood pressure    only for 6 months( due to birth control)   HLD (hyperlipidemia) 06/10/2017   Joint pain    Palpitations 11/12/2017   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 She has never used smokeless tobacco. She reports that she does not drink alcohol and does not use drugs. family history includes Arthritis in her maternal grandmother; Breast cancer in her  maternal grandmother; COPD in her maternal grandmother and mother; Cancer in her brother and maternal grandmother; Colon cancer (age of onset: 52) in her brother; Colon polyps in her maternal grandmother; Heart failure in her maternal grandmother; Hyperlipidemia in her maternal grandmother; Hypertension in her maternal grandmother and mother; Hypothyroidism in her mother; Liver cancer in her brother; Obesity in her father; Stroke in her maternal grandmother; Thyroid  disease in her mother; Vitiligo in her mother. Allergies  Allergen Reactions   Toradol  [Ketorolac  Tromethamine ] Other (See Comments)    Near syncope, diaphoresis, dizziness   Current Outpatient Medications on File Prior to Visit  Medication Sig Dispense Refill   Cholecalciferol (VITAMIN D3) 125 MCG (5000 UT) CAPS Take 1 capsule (5,000 Units total) by mouth daily. 30 capsule 0   cyanocobalamin  (VITAMIN B12) 1000 MCG/ML injection Inject 1 mL (1,000 mcg total) into the muscle every 30 (thirty) days. 3 mL 3   diphenhydrAMINE  (BENADRYL ) 50 MG tablet Take 1 tablet (50 mg total) by mouth every 8 (eight) hours as needed for itching. 30 tablet 0   doxycycline  (VIBRA -TABS) 100 MG tablet Take 1 tablet (100 mg total) by mouth 2 (two) times  daily for 7 days. 14 tablet 0   EPINEPHrine  0.3 mg/0.3 mL IJ SOAJ injection Inject 0.3 mg into the muscle as needed for anaphylaxis. 1 each 2   estradiol (VIVELLE-DOT) 0.075 MG/24HR Place 1 patch onto the skin 2 (two) times a week.     Fexofenadine HCl (ALLEGRA ALLERGY PO) Take by mouth.     hydrOXYzine  (ATARAX ) 10 MG tablet Take 1 tablet (10 mg total) by mouth 3 (three) times daily as needed. 40 tablet 0   meloxicam  (MOBIC ) 15 MG tablet Take 1 tablet (15 mg total) by mouth daily as needed for pain. 30 tablet 0   metFORMIN  (GLUCOPHAGE ) 500 MG tablet Take 1 tablet (500 mg total) by mouth daily. 60 tablet 1   Multiple Vitamin (MULTIVITAMIN) tablet Take 1 tablet by mouth daily.     predniSONE  (STERAPRED UNI-PAK 21  TAB) 10 MG (21) TBPK tablet Take as directed 21 tablet 0   RESTASIS 0.05 % ophthalmic emulsion 1 drop 2 (two) times daily.     triamcinolone  ointment (KENALOG ) 0.5 % Apply 1 Application topically 2 (two) times daily. 30 g 0   No current facility-administered medications on file prior to visit.        ROS:  All others reviewed and negative.  Objective        PE:  BP (!) 154/88   Pulse 73   Temp 98.3 F (36.8 C)   Ht 5' 7 (1.702 m)   Wt 242 lb (109.8 kg)   LMP 08/04/2021 (Approximate)   SpO2 98%   BMI 37.90 kg/m                 Constitutional: Pt appears in NAD               HENT: Head: NCAT.                Right Ear: External ear normal.                 Left Ear: External ear normal.                Eyes: . Pupils are equal, round, and reactive to light. Conjunctivae and EOM are normal               Nose: without d/c or deformity               Neck: Neck supple. Gross normal ROM               Cardiovascular: Normal rate and regular rhythm.                 Pulmonary/Chest: Effort normal and breath sounds without rales or wheezing.                Abd:  Soft, NT, ND, + BS, no organomegaly               Neurological: Pt is alert. At baseline orientation, motor grossly intact               Skin: Skin is warm., LE edema - none, right arm with 1-2+ nontender erythema swelling from elbow to near wrist               Psychiatric: Pt behavior is normal without agitation   Micro: none  Cardiac tracings I have personally interpreted today:  none  Pertinent Radiological findings (summarize): none   Lab Results  Component Value Date   WBC 5.8 01/31/2023  HGB 13.5 01/31/2023   HCT 41.9 01/31/2023   PLT 204 01/31/2023   GLUCOSE 93 01/31/2023   CHOL 192 07/09/2023   TRIG 71.0 07/09/2023   HDL 60.80 07/09/2023   LDLCALC 117 (H) 07/09/2023   ALT 13 01/31/2023   AST 17 01/31/2023   NA 139 01/31/2023   K 4.6 01/31/2023   CL 104 01/31/2023   CREATININE 0.76 01/31/2023   BUN 16  01/31/2023   CO2 23 01/31/2023   TSH 3.36 07/09/2023   HGBA1C 5.3 07/09/2023   Assessment/Plan:  Hannah Cain is a 53 y.o. White or Caucasian [1] female with  has a past medical history of ALLERGIC RHINITIS (03/17/2007), Anemia, Arthritis, B12 deficiency, Back pain, Basal cell carcinoma (BCC) (06/10/2017), Constipation, GERD (gastroesophageal reflux disease) (2022), High blood pressure, HLD (hyperlipidemia) (06/10/2017), Joint pain, Palpitations (11/12/2017), Symptomatic PVCs (06/11/2018), URI (07/19/2009), Vitamin B 12 deficiency, and Vitamin D  deficiency.  Vitamin D  deficiency Last vitamin D  Lab Results  Component Value Date   VD25OH 44.51 07/09/2023   Stable, cont oral replacement   Hypertension BP Readings from Last 3 Encounters:  11/07/23 (!) 154/88  11/04/23 138/74  11/03/23 136/78   uncontrolled, likely reactive, pt to continue  diet, wt control  Angioedema Improving overall, today for repeat depomedrol 80 mg IM, to finish prednisone , but ok to d/c doxycyline., also for epipen   Allergic rhinitis Pt also to f/u allergy as planned, cont current med tx  Followup: Return if symptoms worsen or fail to improve.  Lynwood Rush, MD 11/08/2023 5:22 PM [facility]

## 2023-11-07 NOTE — Patient Instructions (Addendum)
 You had the steroid shot today  Ok to stop the doxycycline   Please continue all other medications as before, including the prednisone  and epipen  prn  Please have the pharmacy call with any other refills you may need.  Please keep your appointments with your specialists as you may have planned - Allergy

## 2023-11-08 ENCOUNTER — Encounter: Payer: Self-pay | Admitting: Internal Medicine

## 2023-11-08 NOTE — Assessment & Plan Note (Signed)
 Improving overall, today for repeat depomedrol 80 mg IM, to finish prednisone , but ok to d/c doxycyline., also for epipen 

## 2023-11-08 NOTE — Assessment & Plan Note (Signed)
 Pt also to f/u allergy as planned, cont current med tx

## 2023-11-08 NOTE — Assessment & Plan Note (Signed)
 BP Readings from Last 3 Encounters:  11/07/23 (!) 154/88  11/04/23 138/74  11/03/23 136/78   uncontrolled, likely reactive, pt to continue  diet, wt control

## 2023-11-08 NOTE — Assessment & Plan Note (Signed)
 Last vitamin D Lab Results  Component Value Date   VD25OH 44.51 07/09/2023   Stable, cont oral replacement

## 2023-11-16 NOTE — Progress Notes (Unsigned)
 SUBJECTIVE: Discussed the use of AI scribe software for clinical note transcription with the patient, who gave verbal consent to proceed.  Chief Complaint: Obesity  Interim History: She is down 3 lbs since her last visit.  Hannah Cain is here to discuss her progress with her obesity treatment plan. She is on the Category 2 Plan and states she is following her eating plan approximately 50 % of the time. She states she is exercising 30-40 minutes 5 times per week. Hannah Cain is a 53 year old female who presents for follow-up of her obesity treatment plan.  She has recently lost three pounds and experiences a decrease in appetite, particularly in the evenings, often skipping meals due to lack of hunger. She started using an estradiol patch in April, initially at 0.5 mg and later increased to 0.75 mg, with no nausea but persistent lack of appetite. We discussed the importance of regular meals and adequate protein intake for weight loss and overall health.   She has a history of insulin  resistance and is currently taking metformin  500 mg once daily. She also receives B12 injections of 1000 mcg monthly and takes vitamin D3 5000 units daily. She experiences constipation more frequently. Her typical breakfast includes yogurt, cheese sticks, toast, or eggs with cheese, while lunch often consists of a [place] sandwich or dinner leftovers. Dinner usually involves chicken or [place] due to her husband's alpha-gal allergy.  In July, she experienced shingles, which left her exhausted and in pain, affecting her ability to exercise and maintain her routine. Shortly after, she suffered a severe reaction to a suspected snake bite, resulting in significant arm swelling and pain. She was treated with doxycycline  for an infection and completed a course of prednisone , which she finished last Tuesday. These events disrupted her exercise routine for about three and a half weeks.  She has a history of multiple joint  arthralgias and is ANA positive. She recently saw a new rheumatologist who confirmed a positive ANA but recent lab in follow up making it less likely she has lupus or other inflammatory conditions.  She reports improvement in joint pain, particularly in her right knee after an injection, allowing her to resume exercises like squats. However, she continues to experience pain in her left shoulder, possibly due to a rotator cuff issue. She was working out regularly until her bout with shingles and then possible snake bite, but has mostly recovered and wants to get back to regular exercise which did seem to be helping with overall sense of well being and generally feeling much better.   Her blood pressure has been increasing. She has not had a menstrual cycle since starting Micronor four years ago and is uncertain about her menopausal status. She has not needed to take Mobic , Advil, or Tylenol  recently.  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 Recent weight loss of 3 pounds, but decreased appetite is a concern. Discussion about increasing metformin  to twice daily to improve insulin  sensitivity, with caution due to potential appetite suppression. - Increase metformin  to 500 mg twice daily with meals, ensuring at least 4 hours between doses. - Monitor for appetite suppression; if significant, revert  to once daily dosing. - Recheck labs in October to assess insulin  levels and A1c. Meds ordered this encounter  Medications   metFORMIN  (GLUCOPHAGE ) 500 MG tablet    Sig: Take 1 tablet (500 mg total) by mouth 2 (two) times daily with a meal.    Dispense:  60 tablet    Refill:  1    Insulin  Resistance Last fasting insulin  was 9.6- not at goal. A1c was 5.3- at goal. Polyphagia:No Medication(s): Metformin  500 mg once daily breakfast No GI upset with metformin .  Does not feel metformin  is causing decreased overall appetite. May be more related to recent illnesses.  Lab Results  Component Value Date   HGBA1C 5.3 07/09/2023   HGBA1C 5.6 01/31/2023   HGBA1C 5.4 09/04/2022   HGBA1C 5.4 07/03/2022   Lab Results  Component Value Date   INSULIN  9.6 01/31/2023   INSULIN  10.3 09/04/2022    Plan: Continue and increase dose Metformin  500 mg twice daily with meals Continue working on nutrition plan to decrease simple carbohydrates, increase lean proteins and exercise to promote weight loss, improve glycemic control and prevent progression to Type 2 diabetes.   Decreased appetite Decreased appetite possibly related to recent stressors including shingles and a suspected snake bite. Appetite suppression could be exacerbated by metformin  increase. We discussed the importance of regular meals and adequate protein intake for weight loss and overall health.  - Monitor appetite with increased metformin  dosing. - Encourage protein intake of 85 grams per day, possibly using protein shakes. - Aim for 1300 calories per day.  Elevated blood pressure (under evaluation) Elevated blood pressure possibly related to menopausal transition or recent steroid use. BP Readings from Last 3 Encounters:  11/17/23 137/80  11/07/23 (!) 154/88  11/04/23 138/74    - Monitor blood pressure regularly. May need medication if not improved.  - Evaluate dietary sodium intake and adjust as needed.  Menopausal  symptoms Menopausal symptoms managed with estradiol patch and progesterone. No nausea reported, but decreased appetite noted. Blood pressure monitoring due to potential menopausal transition effects. - Continue estradiol patch and progesterone. - Monitor blood pressure over time.  Arthralgia, multiple joints with ANA positivity without evidence of autoimmune disease Arthralgia with ANA positivity, but no evidence of autoimmune disease. Recent rheumatology evaluation reassuring, with no indication of lupus. Right knee injection has significantly improved joint pain and exercise tolerance. - Continue current management and exercise regimen. - Monitor joint symptoms and follow up with rheumatology as needed. - Has follow up with Rheumatology   Constipation and slow transit Constipation and slow transit, possibly exacerbated by recent medication changes and decreased activity. - Increase water intake. - Add dietary fiber to improve bowel movements.  Vitamin D  and B12 deficiency Vitamin D  and B12 deficiency, managed with vitamin D3 5000 units daily and B12 injections 1000 mcg monthly. - Continue vitamin D3 5000 units daily. - Continue B12 injections 1000 mcg monthly. Plan to recheck labs in October.  Vitals Temp: 98.1 F (36.7 C) BP: 137/80 Pulse Rate: 67 SpO2: 97 %   Anthropometric Measurements Height: 5' 7 (1.702  m) Weight: 233 lb (105.7 kg) BMI (Calculated): 36.48 Weight at Last Visit: 237 lb Weight Lost Since Last Visit: 4 lb Weight Gained Since Last Visit: 0 Starting Weight: 228 lb Total Weight Loss (lbs): 0 lb (0 kg)   Body Composition  Body Fat %: 45 % Fat Mass (lbs): 105 lbs Muscle Mass (lbs): 122 lbs Total Body Water (lbs): 82 lbs Visceral Fat Rating : 12   Other Clinical Data Fasting: yes Labs: no Today's Visit #: 13 Starting Date: 09/04/22     ASSESSMENT AND PLAN:  Diet: Hannah Cain is currently in the action stage of change. As such, her goal is to  continue with weight loss efforts. She has agreed to Category 2 Plan.  Exercise: Hannah Cain has been instructed to work up to a goal of 150 minutes of combined cardio and strengthening exercise per week and to continue exercising as is for weight loss and overall health benefits.   Behavior Modification:  We discussed the following Behavioral Modification Strategies today: increasing lean protein intake, decreasing simple carbohydrates, increasing vegetables, increase H2O intake, increase high fiber foods, no skipping meals, meal planning and cooking strategies, avoiding temptations, and planning for success. We discussed various medication options to help Hannah Cain with her weight loss efforts and we both agreed to increase metformin  to 500 mg twice daily.  Return in about 5 weeks (around 12/22/2023).Hannah Cain She was informed of the importance of frequent follow up visits to maximize her success with intensive lifestyle modifications for her multiple health conditions.  Attestation Statements:   Reviewed by clinician on day of visit: allergies, medications, problem list, medical history, surgical history, family history, social history, and previous encounter notes.   Time spent on visit including pre-visit chart review and post-visit care and charting was 38 minutes.   Maxyne Derocher, PA-C

## 2023-11-17 ENCOUNTER — Encounter (INDEPENDENT_AMBULATORY_CARE_PROVIDER_SITE_OTHER): Payer: Self-pay | Admitting: Physician Assistant

## 2023-11-17 ENCOUNTER — Ambulatory Visit (INDEPENDENT_AMBULATORY_CARE_PROVIDER_SITE_OTHER): Admitting: Physician Assistant

## 2023-11-17 VITALS — BP 137/80 | HR 67 | Temp 98.1°F | Ht 67.0 in | Wt 233.0 lb

## 2023-11-17 DIAGNOSIS — M2559 Pain in other specified joint: Secondary | ICD-10-CM

## 2023-11-17 DIAGNOSIS — M2550 Pain in unspecified joint: Secondary | ICD-10-CM

## 2023-11-17 DIAGNOSIS — N951 Menopausal and female climacteric states: Secondary | ICD-10-CM

## 2023-11-17 DIAGNOSIS — E538 Deficiency of other specified B group vitamins: Secondary | ICD-10-CM | POA: Diagnosis not present

## 2023-11-17 DIAGNOSIS — R630 Anorexia: Secondary | ICD-10-CM | POA: Diagnosis not present

## 2023-11-17 DIAGNOSIS — Z6836 Body mass index (BMI) 36.0-36.9, adult: Secondary | ICD-10-CM

## 2023-11-17 DIAGNOSIS — R030 Elevated blood-pressure reading, without diagnosis of hypertension: Secondary | ICD-10-CM

## 2023-11-17 DIAGNOSIS — E88819 Insulin resistance, unspecified: Secondary | ICD-10-CM

## 2023-11-17 DIAGNOSIS — E559 Vitamin D deficiency, unspecified: Secondary | ICD-10-CM | POA: Diagnosis not present

## 2023-11-17 DIAGNOSIS — E669 Obesity, unspecified: Secondary | ICD-10-CM

## 2023-11-17 DIAGNOSIS — K5901 Slow transit constipation: Secondary | ICD-10-CM

## 2023-11-17 MED ORDER — METFORMIN HCL 500 MG PO TABS: 500.0000 mg | ORAL_TABLET | Freq: Two times a day (BID) | ORAL | 1 refills | Status: DC

## 2023-11-20 ENCOUNTER — Other Ambulatory Visit

## 2023-11-20 ENCOUNTER — Ambulatory Visit (INDEPENDENT_AMBULATORY_CARE_PROVIDER_SITE_OTHER)

## 2023-11-20 DIAGNOSIS — E538 Deficiency of other specified B group vitamins: Secondary | ICD-10-CM | POA: Diagnosis not present

## 2023-11-20 MED ORDER — CYANOCOBALAMIN 1000 MCG/ML IJ SOLN
1000.0000 ug | Freq: Once | INTRAMUSCULAR | Status: AC
  Administered 2023-11-20: 1000 ug via INTRAMUSCULAR

## 2023-11-20 NOTE — Progress Notes (Signed)
 After obtaining consent, and per orders of Dr. Jonny Ruiz, injection of B12 given by Ferdie Ping. Patient instructed to report any adverse reaction to me immediately.

## 2023-12-09 ENCOUNTER — Other Ambulatory Visit (INDEPENDENT_AMBULATORY_CARE_PROVIDER_SITE_OTHER): Payer: Self-pay | Admitting: Physician Assistant

## 2023-12-09 DIAGNOSIS — E88819 Insulin resistance, unspecified: Secondary | ICD-10-CM

## 2023-12-18 ENCOUNTER — Encounter (INDEPENDENT_AMBULATORY_CARE_PROVIDER_SITE_OTHER): Payer: Self-pay | Admitting: Otolaryngology

## 2023-12-18 ENCOUNTER — Ambulatory Visit (INDEPENDENT_AMBULATORY_CARE_PROVIDER_SITE_OTHER): Admitting: Otolaryngology

## 2023-12-18 VITALS — BP 151/83 | HR 77 | Ht 67.0 in | Wt 235.0 lb

## 2023-12-18 DIAGNOSIS — R221 Localized swelling, mass and lump, neck: Secondary | ICD-10-CM

## 2023-12-18 DIAGNOSIS — K1120 Sialoadenitis, unspecified: Secondary | ICD-10-CM

## 2023-12-18 DIAGNOSIS — R768 Other specified abnormal immunological findings in serum: Secondary | ICD-10-CM | POA: Diagnosis not present

## 2023-12-18 DIAGNOSIS — R682 Dry mouth, unspecified: Secondary | ICD-10-CM

## 2023-12-18 DIAGNOSIS — H04123 Dry eye syndrome of bilateral lacrimal glands: Secondary | ICD-10-CM

## 2023-12-18 NOTE — Progress Notes (Signed)
 Dear Dr. Norleen, Here is my assessment for our mutual patient, Hannah Cain. Thank you for allowing me the opportunity to care for your patient. Please do not hesitate to contact me should you have any other questions. Sincerely, Dr. Eldora Blanch  Otolaryngology Clinic Note Referring provider: Dr. Norleen HPI:  Hannah Cain is a 53 y.o. female kindly referred by Dr. Norleen for evaluation of right neck mass/lesion.   Initial visit (12/2023): She reports that she noted a right neck (points to [facility] area) this past summer. It was noted to be tender, but denies antecedent event such as sickness, dehydration. She saw Dr. Norleen for this, who also felt right neck lymph nodes and prescribed her a Z-pak for possible lymphadenitis. She reports that the tenderness has resolved, now just reporting some swelling over the SMG area.  She does report chronic dry eyes and dry mouth. Patient otherwise denies: - dysphagia, odynophagia, PNA, need for Heimlich, unintentional weight loss - changes in voice, shortness of breath, hemoptysis - ear pain  Of note, she does follow with Rheumatology for positive ANA, with other testing negative. Tested for Sjogren's, negative.  ENT Surgery: basal cell on nose Personal or FHx of bleeding dz or anesthesia difficulty: no  AP/AC: no  Tobacco: no  PMHx: HTN (intermittent), Joint Pain, Left leg numbness, Insulin  resistance on Metformin   Independent Review of Additional Tests or Records:  Dr. Norleen (10/02/2023): noted tender swelling lump right neck (per patient), no skin change. No ENT sx; Dx: Lymphadenitis; Rx: z-pak f/u if worsening sx Hannah Cain (11/10/2023): ANA positive, otherwise negative RF, anti-CCP, CRP, ESR, TSH wnl; possible sjogren's, possible seroneg RA; ordered labs CBC and CMP 01/31/2023: WBC 5.8, Eos 200; BUN/Cr 16/0.76  PMH/Meds/All/SocHx/FamHx/ROS:   Past Medical History:  Diagnosis Date   ALLERGIC RHINITIS 03/17/2007   Anemia    Arthritis    Right knee    B12 deficiency    Back pain    Basal cell carcinoma (BCC) 06/10/2017   Constipation    GERD (gastroesophageal reflux disease) 2022   High blood pressure    only for 6 months( due to birth control)   HLD (hyperlipidemia) 06/10/2017   Joint pain    Palpitations 11/12/2017   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   URI 07/19/2009   Vitamin B 12 deficiency    Vitamin D  deficiency      Past Surgical History:  Procedure Laterality Date   BREAST BIOPSY Right 1990   MOHS SURGERY  2018   nose   MOUTH SURGERY      Family History  Problem Relation Age of Onset   Thyroid  disease Mother    Hypertension Mother    Hypothyroidism Mother    Vitiligo Mother    COPD Mother    Obesity Father    Colon cancer Brother 58       mets to liver   Liver cancer Brother    Cancer Brother    Breast cancer Maternal Grandmother    Colon polyps Maternal Grandmother    Stroke Maternal Grandmother    Hypertension Maternal Grandmother    Hyperlipidemia Maternal Grandmother    COPD Maternal Grandmother    Heart failure Maternal Grandmother    Arthritis Maternal Grandmother    Cancer Maternal Grandmother    Rectal cancer Neg Hx    Stomach cancer Neg Hx      Social Connections: Moderately Integrated (10/01/2023)   Social Connection and Isolation Panel    Frequency of Communication with Friends and Family: More  than three times a week    Frequency of Social Gatherings with Friends and Family: Once a week    Attends Religious Services: 1 to 4 times per year    Active Member of [employer] or Organizations: No    Attends Engineer, structural: Not on file    Marital Status: Married      Current Outpatient Medications:    Cholecalciferol (VITAMIN D3) 125 MCG (5000 UT) CAPS, Take 1 capsule (5,000 Units total) by mouth daily., Disp: 30 capsule, Rfl: 0   cyanocobalamin  (VITAMIN B12) 1000 MCG/ML injection, Inject 1 mL into the muscle., Disp: , Rfl:    EPINEPHrine  0.3 mg/0.3 mL IJ SOAJ injection, Inject 0.3 mg  into the muscle as needed for anaphylaxis., Disp: 1 each, Rfl: 2   estradiol (VIVELLE-DOT) 0.075 MG/24HR, Place 1 patch onto the skin., Disp: , Rfl:    fexofenadine (ALLEGRA) 180 MG tablet, Take 180 mg by mouth., Disp: , Rfl:    meloxicam  (MOBIC ) 15 MG tablet, Take 1 tablet (15 mg total) by mouth daily as needed for pain., Disp: 30 tablet, Rfl: 0   metFORMIN  (GLUCOPHAGE ) 500 MG tablet, Take 1 tablet (500 mg total) by mouth 2 (two) times daily with a meal., Disp: 60 tablet, Rfl: 1   Multiple Vitamin (MULTIVITAMIN) tablet, Take 1 tablet by mouth daily., Disp: , Rfl:    Olopatadine HCl 0.6 % SOLN, Place 2 sprays into the nose., Disp: , Rfl:    progesterone (PROMETRIUM) 100 MG capsule, Take 100 mg by mouth daily., Disp: , Rfl:    RESTASIS 0.05 % ophthalmic emulsion, 1 drop 2 (two) times daily., Disp: , Rfl:    diphenhydrAMINE  (BENADRYL ) 50 MG tablet, Take 1 tablet (50 mg total) by mouth every 8 (eight) hours as needed for itching. (Patient not taking: Reported on 12/18/2023), Disp: 30 tablet, Rfl: 0   hydrOXYzine  (ATARAX ) 10 MG tablet, Take 1 tablet (10 mg total) by mouth 3 (three) times daily as needed. (Patient not taking: Reported on 12/18/2023), Disp: 40 tablet, Rfl: 0   predniSONE  (STERAPRED UNI-PAK 21 TAB) 10 MG (21) TBPK tablet, Take as directed (Patient not taking: Reported on 12/18/2023), Disp: 21 tablet, Rfl: 0   triamcinolone  ointment (KENALOG ) 0.5 %, Apply 1 Application topically 2 (two) times daily. (Patient not taking: Reported on 12/18/2023), Disp: 30 g, Rfl: 0   Physical Exam:   BP (!) 151/83 (BP Location: Left Arm, Patient Position: Sitting, Cuff Size: Large)   Pulse 77   Ht 5' 7 (1.702 m)   Wt 235 lb (106.6 kg)   LMP 08/04/2021 (Approximate)   SpO2 96%   BMI 36.81 kg/m   Salient findings:  CN II-XII intact  Bilateral EAC clear and TM intact with well pneumatized middle ear spaces Anterior rhinoscopy: Septum relatively midline; bilateral inferior turbinates without significant  hypertrophy No lesions of oral cavity/oropharynx; easily expressible saliva both parotid and SMG ducts, mildly dry oral mucosa No obviously palpable neck masses/lymphadenopathy/thyromegaly; area where she points to appears to be her right submandibular gland; no other palpable lymphadenopathy or skin lesions on neck No respiratory distress or stridor  Seprately Identifiable Procedures:  Prior to initiating any procedures, risks/benefits/alternatives were explained to the patient and verbal consent obtained. None  Impression & Plans:  Hannah Cain is a 53 y.o. female with:  1. Neck swelling   2. Sialadenitis   3. ANA positive   4. Dry mouth and eyes    Noted right neck tenderness, appears to be most  consistent with likely sialadenitis of right SMG in setting of dry mouth and eyes. Resolved now, no residual symptoms. Unable to palpate any LAD.  We discussed options: observation v/s imaging to rule out any underlying lesion Given resolution, she opted for observation. D/w pt re: management of sialadenitis. Encourage good hydration ANA positive: agree with Rheum workup If recurs, advised to call back  See below regarding exact medications prescribed this encounter including dosages and route: No orders of the defined types were placed in this encounter.     Thank you for allowing me the opportunity to care for your patient. Please do not hesitate to contact me should you have any other questions.  Sincerely, Eldora Blanch, MD Otolaryngologist (ENT), [facility] Phone: [phone] Fax: [phone]  12/18/2023, 9:03 AM   MDM:  Level [phone] Complexity/Problems addressed: mod - chronic problems Data complexity: mod - independent review of notes, labs - Morbidity: low currently  - Prescription Drug prescribed or managed: no

## 2023-12-23 ENCOUNTER — Ambulatory Visit (INDEPENDENT_AMBULATORY_CARE_PROVIDER_SITE_OTHER): Admitting: Physician Assistant

## 2023-12-24 ENCOUNTER — Ambulatory Visit (INDEPENDENT_AMBULATORY_CARE_PROVIDER_SITE_OTHER)

## 2023-12-24 DIAGNOSIS — E538 Deficiency of other specified B group vitamins: Secondary | ICD-10-CM

## 2023-12-24 MED ORDER — CYANOCOBALAMIN 1000 MCG/ML IJ SOLN
1000.0000 ug | Freq: Once | INTRAMUSCULAR | Status: AC
  Administered 2023-12-24: 1000 ug via INTRAMUSCULAR

## 2023-12-24 NOTE — Progress Notes (Signed)
 Pt here for monthly B12 injection per   B12 1000mcg given IM and pt tolerated injection well.  Pt responded well to vaccine.

## 2023-12-29 NOTE — Progress Notes (Unsigned)
 SUBJECTIVE: Discussed the use of AI scribe software for clinical note transcription with the patient, who gave verbal consent to proceed.  Chief Complaint: Obesity  Interim History: She has maintained her weight since last visit.  Muscle mass + 1.0 lb Adipose mass - 1.4 lbs  Niyla is here to discuss her progress with her obesity treatment plan. She is on the Category 1 Plan and states she is following her eating plan approximately 80 % of the time. She states she is exercising cardio/weights working with a Systems analyst 40 minutes 5 times per week. JONIE BURDELL is a 53 year old female who presents for follow-up on her obesity treatment plan.  She has maintained her weight since the last visit and engages in regular exercise, including weight training and cardio for 40 minutes five days per week. She has increased her muscle mass by one pound and decreased adipose tissue by 1.4 pounds. She follows a category one diet with approximately 80% compliance, consuming 1200 to 1300 calories daily, with 75 to 85 grams of protein. She experiences no significant hunger during the day.  She has a history of insulin  resistance and is currently taking metformin . She has no increased cravings and has stopped consuming Dr. Nunzio, which she previously used for headaches. She is generally only hungry at breakfast, but has little appetite at other times. She has no excessive cravings.   She experiences menopausal symptoms and finds it challenging to lose weight during this period. She reports knee discomfort, particularly when descending steps, but it resolves quickly.  She has a history of vitamin D  and B12 deficiency and takes over-the-counter vitamin D  supplements. She reports poor sleep .  She experiences constipation more frequently than usual. She has not been taking any medication regularly for it but has used Colace as needed in the past. She hydrates  well, drinking several bottles of water daily, and consumes fruits like strawberries and grapes. She also makes smoothies with spinach, carrots, zucchini, blueberries, and strawberries, adding collagen powder, chia seeds, flax seeds, and oatmeal.  Her blood pressure has been elevated in the past but is currently 124/76. She monitors her blood pressure at home and notes it has not been consistently elevated. She attributes previous elevations to factors like steroid use for a bite on her arm and possibly dietary salt intake.  She is considering a job change due to her supervisor's retirement, which is causing some stress. She is contemplating applying for the position, which would involve a significant pay increase but also more responsibility.  OBJECTIVE: Visit Diagnoses: Problem List Items Addressed This Visit     Vitamin D  deficiency   Generalized obesity with inital BMI 36   Relevant Medications   metFORMIN  (GLUCOPHAGE ) 500 MG tablet   Vitamin B 12 deficiency   Insulin  resistance - Primary   Relevant Medications   metFORMIN  (GLUCOPHAGE ) 500 MG tablet   Poor sleep   Other Visit Diagnoses       Decreased appetite         Elevated BP without diagnosis of hypertension         Menopausal symptoms         Obesity with insulin  resistance Obesity with insulin  resistance is managed with lifestyle modifications and metformin . She has maintained weight, gained muscle mass, and lost adipose tissue. Compliance with a category one diet and regular exercise is noted. Metformin  improves insulin  sensitivity and aids in utilizing adipose for energy, beneficial during menopausal transition. Appetite is  not significantly increased despite increased physical activity, which is atypical. No significant cravings reported. - Continue metformin  1000 mg daily - Consider reducing metformin  to 500 mg daily if appetite suppression is a concern - Continue category one diet with 80% compliance - Continue regular  exercise regimen with personal trainer - Consider omega-3 supplementation - Monitor weight and body composition changes   Insulin  Resistance Last fasting insulin  was 9.6. A1c was 5.3. Polyphagia:No Medication(s): metformin  1000 mg daily  No GI upset, but some increased constipation issues with metformin .  Lab Results  Component Value Date   HGBA1C 5.3 07/09/2023   HGBA1C 5.6 01/31/2023   HGBA1C 5.4 09/04/2022   HGBA1C 5.4 07/03/2022   Lab Results  Component Value Date   INSULIN  9.6 01/31/2023   INSULIN  10.3 09/04/2022    Plan: Continue and refill metformin  1000 mg daily with option to decrease to 500 mg daily and see if this improves overall appetite as well as constipation.  Continue working on nutrition plan to decrease simple carbohydrates, increase lean proteins and exercise to promote weight loss, improve glycemic control and prevent progression to Type 2 diabetes.   Menopausal symptoms Menopausal symptoms contribute to difficulty in weight loss. Metformin  may help mitigate weight gain associated with menopause. Discussed potential benefits of omega-3 supplementation for inflammation and menopausal symptoms. - Consider omega-3 supplementation- fish oil  Elevated blood pressure without diagnosis of HTN Blood pressure is well-controlled at 124/76 mmHg. Previous elevations may have been related to steroid use or dietary factors. Metformin  is not known to affect blood pressure. Monitoring is advised, especially around menopause. BP Readings from Last 3 Encounters:  12/31/23 124/76  12/18/23 (!) 151/83  11/17/23 137/80   Lab Results  Component Value Date   NA 139 01/31/2023   CL 104 01/31/2023   K 4.6 01/31/2023   CO2 23 01/31/2023   BUN 16 01/31/2023   CREATININE 0.76 01/31/2023   EGFR 95 01/31/2023   CALCIUM 9.1 01/31/2023   ALBUMIN 4.4 01/31/2023   GLUCOSE 93 01/31/2023   Plan:  Continue to work on nutrition plan to promote weight loss and improve BP control.  -  Monitor blood pressure regularly - Avoid high-sodium foods - Reassess if blood pressure elevations persist  Constipation Constipation is persistent. She is well-hydrated and consumes a diet with some fruits and vegetables. - Take Colace daily or every other day as needed Hx of colonoscopy 2023 without polyps ( Brother Dx with colon Ca in 40's and eventually passed last year)  - Consider Miralax if Colace is insufficient  Vitamin D  and B12 deficiencies Vitamin D  deficiency is managed with over-the-counter supplementation. Vitamin D  levels should be monitored, especially as weight loss progresses and adipose tissue decreases. Last vitamin D  Lab Results  Component Value Date   VD25OH 44.51 07/09/2023   Lab Results  Component Value Date   VITAMINB12 1,097 01/31/2023   Low vitamin D  levels can be associated with adiposity and may result in leptin resistance and weight gain. Also associated with fatigue.  Currently on vitamin D  supplementation without any adverse effects such as nausea, vomiting or muscle weakness.   - Continue over-the-counter vitamin D  5000 IU daily - Monitor vitamin D /B12 levels in the coming months  Vitals Temp: 98.4 F (36.9 C) BP: 124/76 Pulse Rate: 74 SpO2: 99 %   Anthropometric Measurements Height: 5' 7 (1.702 m) Weight: 233 lb (105.7 kg) BMI (Calculated): 36.48 Weight at Last Visit: 233lb Weight Lost Since Last Visit: 0lb Weight Gained Since  Last Visit: 0lb Starting Weight: 228lb Total Weight Loss (lbs): 0 lb (0 kg) Peak Weight: 244lb   Body Composition  Body Fat %: 44.4 % Fat Mass (lbs): 103.6 lbs Muscle Mass (lbs): 123 lbs Total Body Water (lbs): 85.2 lbs Visceral Fat Rating : 12   Other Clinical Data RMR: 1642 Fasting: No Labs: no Today's Visit #: 14 Starting Date: 09/04/22     ASSESSMENT AND PLAN:  Diet: Torah is currently in the action stage of change. As such, her goal is to continue with weight loss efforts. She has  agreed to Category 1 Plan.  Exercise: Londa has been instructed to work up to a goal of 150 minutes of combined cardio and strengthening exercise per week and to continue exercising as is for weight loss and overall health benefits.   Behavior Modification:  We discussed the following Behavioral Modification Strategies today: increasing lean protein intake, decreasing simple carbohydrates, increasing vegetables, increase H2O intake, increase high fiber foods, meal planning and cooking strategies, avoiding temptations, and planning for success. We discussed various medication options to help Rosell with her weight loss efforts and we both agreed to continue current treatment plan.  Return in about 4 weeks (around 01/28/2024).SABRA She was informed of the importance of frequent follow up visits to maximize her success with intensive lifestyle modifications for her multiple health conditions.  Attestation Statements:   Reviewed by clinician on day of visit: allergies, medications, problem list, medical history, surgical history, family history, social history, and previous encounter notes.   Time spent on visit including pre-visit chart review and post-visit care and charting was 40 minutes.    Makelle Marrone, PA-C

## 2023-12-31 ENCOUNTER — Ambulatory Visit (INDEPENDENT_AMBULATORY_CARE_PROVIDER_SITE_OTHER): Admitting: Physician Assistant

## 2023-12-31 ENCOUNTER — Encounter (INDEPENDENT_AMBULATORY_CARE_PROVIDER_SITE_OTHER): Payer: Self-pay | Admitting: Physician Assistant

## 2023-12-31 VITALS — BP 124/76 | HR 74 | Temp 98.4°F | Ht 67.0 in | Wt 233.0 lb

## 2023-12-31 DIAGNOSIS — R630 Anorexia: Secondary | ICD-10-CM | POA: Diagnosis not present

## 2023-12-31 DIAGNOSIS — E559 Vitamin D deficiency, unspecified: Secondary | ICD-10-CM | POA: Diagnosis not present

## 2023-12-31 DIAGNOSIS — Z72820 Sleep deprivation: Secondary | ICD-10-CM

## 2023-12-31 DIAGNOSIS — R030 Elevated blood-pressure reading, without diagnosis of hypertension: Secondary | ICD-10-CM | POA: Diagnosis not present

## 2023-12-31 DIAGNOSIS — Z6836 Body mass index (BMI) 36.0-36.9, adult: Secondary | ICD-10-CM

## 2023-12-31 DIAGNOSIS — N951 Menopausal and female climacteric states: Secondary | ICD-10-CM

## 2023-12-31 DIAGNOSIS — E88819 Insulin resistance, unspecified: Secondary | ICD-10-CM

## 2023-12-31 DIAGNOSIS — E669 Obesity, unspecified: Secondary | ICD-10-CM

## 2023-12-31 DIAGNOSIS — E538 Deficiency of other specified B group vitamins: Secondary | ICD-10-CM

## 2023-12-31 MED ORDER — METFORMIN HCL 500 MG PO TABS: 500.0000 mg | ORAL_TABLET | Freq: Two times a day (BID) | ORAL | 1 refills | Status: DC

## 2024-01-26 ENCOUNTER — Ambulatory Visit

## 2024-01-27 ENCOUNTER — Encounter (INDEPENDENT_AMBULATORY_CARE_PROVIDER_SITE_OTHER): Payer: Self-pay | Admitting: Physician Assistant

## 2024-02-03 ENCOUNTER — Ambulatory Visit (INDEPENDENT_AMBULATORY_CARE_PROVIDER_SITE_OTHER): Admitting: Physician Assistant

## 2024-02-03 ENCOUNTER — Encounter (INDEPENDENT_AMBULATORY_CARE_PROVIDER_SITE_OTHER): Payer: Self-pay | Admitting: Physician Assistant

## 2024-02-03 VITALS — BP 127/72 | HR 73 | Temp 98.4°F | Ht 67.0 in | Wt 228.0 lb

## 2024-02-03 DIAGNOSIS — Z6835 Body mass index (BMI) 35.0-35.9, adult: Secondary | ICD-10-CM

## 2024-02-03 DIAGNOSIS — E559 Vitamin D deficiency, unspecified: Secondary | ICD-10-CM | POA: Diagnosis not present

## 2024-02-03 DIAGNOSIS — E88819 Insulin resistance, unspecified: Secondary | ICD-10-CM | POA: Diagnosis not present

## 2024-02-03 DIAGNOSIS — M2550 Pain in unspecified joint: Secondary | ICD-10-CM

## 2024-02-03 DIAGNOSIS — M25519 Pain in unspecified shoulder: Secondary | ICD-10-CM | POA: Diagnosis not present

## 2024-02-03 DIAGNOSIS — M25569 Pain in unspecified knee: Secondary | ICD-10-CM

## 2024-02-03 DIAGNOSIS — E669 Obesity, unspecified: Secondary | ICD-10-CM

## 2024-02-03 DIAGNOSIS — E538 Deficiency of other specified B group vitamins: Secondary | ICD-10-CM

## 2024-02-03 NOTE — Progress Notes (Signed)
 SUBJECTIVE: Discussed the use of AI scribe software for clinical note transcription with the patient, who gave verbal consent to proceed.  Chief Complaint: Obesity  Interim History: She is down 5 lbs since her last visit.   Hannah Cain is here to discuss her progress with her obesity treatment plan. She is on the Category 1 Plan and states she is following her eating plan approximately 75-80 % of the time. She states she is exercising strength training/mixed cardio 30-45 minutes 2/3 times per week.  Hannah Cain is a 53 year old female who presents for follow-up of her obesity treatment plan.  She adheres to her obesity treatment plan 75-80% of the time, focusing on calorie tracking, consuming whole foods, adequate protein intake, and hydration. She exercises regularly, incorporating two days of strength training and three days of mixed cardio, totaling 30-45 minutes per session, five days a week. She has successfully lost five pounds since her last visit.  She was previously on metformin  for insulin  resistance and prediabetes but discontinued it due to severe constipation. Currently, she takes vitamin D3 at 5000 units and receives monthly B12 injections of 1000 mcg. She has paused her vitamin intake for the past week due to bone pain, which she suspects might be linked to vitamin D . Her blood pressure has stabilized without medication, although it fluctuated previously. She noticed an increase in blood pressure when she started estradiol and progesterone, coinciding with the initiation of metformin . Her A1c was 5.6 a year ago, with slightly elevated insulin  levels.  She feels physically better and can engage in activities like hiking, which she couldn't do before. However, she experiences knee irritation and persistent shoulder discomfort, likely due to recent hiking.  She has eliminated Dr. Nunzio from her diet, previously a dietary challenge, and now incorporates vegetables into her meals by  adding them to smoothies and other dishes, although she does not consume them alone.  Fasting labs obtained today.  The patient was informed we would discuss the lab results at the next visit unless there is a critical issue that needs to be addressed sooner. The patient agreed to keep the next visit at the agreed upon time to discuss these results.   OBJECTIVE: Visit Diagnoses: Problem List Items Addressed This Visit     Vitamin D  deficiency   Relevant Orders   VITAMIN D  25 Hydroxy (Vit-D Deficiency, Fractures)   Generalized obesity with inital BMI 36   Vitamin B 12 deficiency   Relevant Orders   Vitamin B12   BMI 35.0-35.9,adult   Insulin  resistance - Primary   Relevant Orders   CMP14+EGFR   Hemoglobin A1c   Insulin , random   Lipid Panel With LDL/HDL Ratio   Other Visit Diagnoses       Arthralgia, multiple joints         Obesity with mild insulin  resistance Obesity with insulin  resistance, showing improvement with lifestyle modifications. Weight loss of 5 pounds since last visit, with a reduction in adipose tissue from 103.6 pounds to 99.6 pounds. Muscle mass mostly maintained. Previously on metformin  for insulin  resistance, but discontinued due to constipation. A1c was 5.6 a year ago, and insulin  levels were slightly elevated but not severely. Current regimen includes increased physical activity and dietary changes, which are positively impacting body composition and metabolism. - Continue current exercise regimen: 2 days of strength training and 3 days of mixed cardio, 30-45 minutes, 5 days per week. - Maintain dietary changes: focus on whole foods, adequate protein, and  hydration. - Recheck A1c and insulin  levels and CMET today. . - Provide anticipatory guidance for holiday eating: focus on protein intake, portion control, and return to normal eating habits after holiday meals.  Arthralgias multiple joints Some residual knee and shoulder pain, with recent increase in knee  pain following hiking activity. Shoulder pain is persistent but manageable. Encouraged to maintain muscle strength around affected joints to protect them. She has follow up with Rheumatology soon.  Continue to work on nutrition plan and exercise to promote healthy weight loss and improved mobility/decreased inflammation.  - Seek evaluation if pain significantly worsens.  Vitamin D  deficiency Vitamin D  deficiency, not taking 5000 units of vitamin D3. Reports bone pain in left shin and has stopped taking vitamin D  for the past week. Plan to recheck vitamin D  levels to assess current status. - Recheck vitamin D  levels today and manage supplement accordingly.   Vitamin B12 deficiency Vitamin B12 deficiency, managed with monthly B12 injections of 1000 mcg in the past. She is taking biotin currently due to concerns with increased hair loss lately. Reports energy levels overall much improved . Plan Recheck B 12 levels and manage supplementation accordingly.    General Health Maintenance General health maintenance discussed, including strategies for maintaining healthy habits during holidays and the importance of continued physical activity and dietary management. - Recheck liver and kidney function, lipids, and other vitamin levels along with A1c and insulin  levels today. - Encourage continued avoidance of sugary drinks and processed foods. - Promote increased vegetable intake through smoothies and mixed dishes.  Vitals Temp: 98.4 F (36.9 C) BP: 127/72 Pulse Rate: 73 SpO2: 97 %   Anthropometric Measurements Height: 5' 7 (1.702 m) Weight: 228 lb (103.4 kg) BMI (Calculated): 35.7 Weight at Last Visit: 233 lb Weight Lost Since Last Visit: 5 lb Weight Gained Since Last Visit: 0 Starting Weight: 228 lb Total Weight Loss (lbs): 0 lb (0 kg) Peak Weight: 244 lb   Body Composition  Body Fat %: 43.6 % Fat Mass (lbs): 99.6 lbs Muscle Mass (lbs): 122.4 lbs Total Body Water (lbs): 83.2  lbs Visceral Fat Rating : 12   Other Clinical Data Fasting: No Labs: No Today's Visit #: 15 Starting Date: 09/04/22     ASSESSMENT AND PLAN:  Diet: [place]a is currently in the action stage of change. As such, her goal is to continue with weight loss efforts. She has agreed to Category 1 Plan.  Exercise: Jesicca has been instructed to continue exercising as is for weight loss and overall health benefits.   Behavior Modification:  We discussed the following Behavioral Modification Strategies today: increasing lean protein intake, decreasing simple carbohydrates, increasing vegetables, increase H2O intake, increase high fiber foods, meal planning and cooking strategies, holiday eating strategies, avoiding temptations, and planning for success. We discussed various medication options to help Lacrystal with her weight loss efforts and we both agreed to continue current treatment plan.  Return in about 5 weeks (around 03/09/2024).SABRA She was informed of the importance of frequent follow up visits to maximize her success with intensive lifestyle modifications for her multiple health conditions.  Attestation Statements:   Reviewed by clinician on day of visit: allergies, medications, problem list, medical history, surgical history, family history, social history, and previous encounter notes.   Time spent on visit including pre-visit chart review and post-visit care and charting was 28 minutes.    Shevawn Langenberg, PA-C

## 2024-02-04 LAB — CMP14+EGFR
ALT: 26 IU/L (ref 0–32)
AST: 25 IU/L (ref 0–40)
Albumin: 4.3 g/dL (ref 3.8–4.9)
Alkaline Phosphatase: 71 IU/L (ref 49–135)
BUN/Creatinine Ratio: 14 (ref 9–23)
BUN: 12 mg/dL (ref 6–24)
Bilirubin Total: 0.6 mg/dL (ref 0.0–1.2)
CO2: 22 mmol/L (ref 20–29)
Calcium: 9.5 mg/dL (ref 8.7–10.2)
Chloride: 101 mmol/L (ref 96–106)
Creatinine, Ser: 0.85 mg/dL (ref 0.57–1.00)
Globulin, Total: 2.8 g/dL (ref 1.5–4.5)
Glucose: 94 mg/dL (ref 70–99)
Potassium: 4.8 mmol/L (ref 3.5–5.2)
Sodium: 139 mmol/L (ref 134–144)
Total Protein: 7.1 g/dL (ref 6.0–8.5)
eGFR: 82 mL/min/1.73 (ref >59)

## 2024-02-04 LAB — LIPID PANEL WITH LDL/HDL RATIO
Cholesterol, Total: 237 mg/dL — ABNORMAL HIGH (ref 100–199)
HDL: 78 mg/dL (ref >39)
LDL Chol Calc (NIH): 147 mg/dL — ABNORMAL HIGH (ref 0–99)
LDL/HDL Ratio: 1.9 ratio (ref 0.0–3.2)
Triglycerides: 70 mg/dL (ref 0–149)
VLDL Cholesterol Cal: 12 mg/dL (ref 5–40)

## 2024-02-04 LAB — HEMOGLOBIN A1C
Est. average glucose Bld gHb Est-mCnc: 100 mg/dL
Hgb A1c MFr Bld: 5.1 % (ref 4.8–5.6)

## 2024-02-04 LAB — VITAMIN B12: Vitamin B-12: 946 pg/mL (ref 232–1245)

## 2024-02-04 LAB — VITAMIN D 25 HYDROXY (VIT D DEFICIENCY, FRACTURES): Vit D, 25-Hydroxy: 55.1 ng/mL (ref 30.0–100.0)

## 2024-02-04 LAB — INSULIN, RANDOM: INSULIN: 11 u[IU]/mL (ref 2.6–24.9)

## 2024-02-05 ENCOUNTER — Encounter (INDEPENDENT_AMBULATORY_CARE_PROVIDER_SITE_OTHER): Payer: Self-pay | Admitting: Physician Assistant

## 2024-03-01 ENCOUNTER — Ambulatory Visit (INDEPENDENT_AMBULATORY_CARE_PROVIDER_SITE_OTHER)

## 2024-03-01 DIAGNOSIS — E538 Deficiency of other specified B group vitamins: Secondary | ICD-10-CM

## 2024-03-01 MED ORDER — CYANOCOBALAMIN 1000 MCG/ML IJ SOLN
1000.0000 ug | Freq: Once | INTRAMUSCULAR | Status: AC
  Administered 2024-03-01: 1000 ug via INTRAMUSCULAR

## 2024-03-01 NOTE — Progress Notes (Signed)
 After obtaining consent, and per orders of Dr. Jonny Ruiz, injection of B12 given by Ferdie Ping. Patient instructed to report any adverse reaction to me immediately.

## 2024-03-09 NOTE — Progress Notes (Unsigned)
 SUBJECTIVE: Discussed the use of AI scribe software for clinical note transcription with the patient, who gave verbal consent to proceed.  Chief Complaint: Obesity  Interim History: She is down 2 lbs since her last visit.    Reviewed body adiposity improvement since starting with HWW - initial 45.4% , today 43.7%  Hannah Cain is here to discuss her progress with her obesity treatment plan. She is on the Category 1 Plan and states she is following her eating plan approximately 60 % of the time. She states she is exercising strength training/cardio training 30-60 minutes 2-3 times per week.  Hannah Cain is a 53 year old female who presents for follow-up of her obesity treatment plan.  She is following a category one obesity treatment plan and has experienced a two-pound weight loss since her last visit. She adheres to the plan 60% of the time, tracking calories and macros, eating more whole foods, and getting the recommended amount of protein. She drinks more water, does not skip meals, and sleeps at least seven hours per night. She engages in strength and cardio training, as well as walking 30 to 60 minutes two to three days per week.  She has a history of insulin  resistance, hypercholesterolemia, vitamin D  deficiency, poor sleep with menopausal symptoms, and elevated blood pressure without a diagnosis of hypertension. Her recent lipid panel showed an increase in LDL to 147 and total cholesterol to 237. Her last A1c was 5.1, and her insulin  level is stable at 11.0. The rest of her labs are stable with no critical values.  She is currently taking cholecalciferol 5000 units once daily for vitamin D  deficiency, B12 injections 1000 micrograms once a month, estradiol patches, a multivitamin daily, and progesterone 100 mg daily for poor sleep and perimenopausal symptoms. She has stopped metformin  and is taking omega-3 and berberine supplements.  She experienced poor sleep when she took a break from both  estradiol and progesterone for about two weeks. She has resumed estradiol at 0.05 mg and continues progesterone at the same dose.  She did have a significant increase in her LDL and overall cholesterol since starting HRT and progesterone , but has also seen significant improvements in sleep following the initiation of HRT/progesterone. She is making dietary changes, including reducing dairy fat and using egg whites instead of whole eggs due to the cholesterol levels.  She has not had liver imaging done, liver function enzymes are normal. She has reduced her vitamin D  intake to 2000 units due to muscle pain in her leg, although her vitamin D  level is 55. She continues B12 injections monthly, with her level at 946- in good range. OBJECTIVE: Visit Diagnoses: Problem List Items Addressed This Visit     Vitamin D  deficiency   Generalized obesity with inital BMI 36   BMI 35.0-35.9,adult   Insulin  resistance   Poor sleep   Other Visit Diagnoses       Pure hypercholesterolemia    -  Primary     Menopausal symptoms         Obesity Management is ongoing with a focus on weight loss and body composition improvement. She has experienced a 2-pound weight loss since the last visit, following a category one plan 60% of the time. She is tracking calories and macros, consuming more whole foods, and engaging in regular physical activity including strength and cardio training. Body adiposity has decreased from 45.4% to 43.7%, indicating progress. Visceral fat rating remains unchanged, which is challenging to reduce. -  Continue current nutrition plan and exercise regimen. - Monitor body composition and weight regularly.  Hypercholesterolemia Recent lipid panel shows significant worsening with LDL at 147 mg/dL and total cholesterol at 237 mg/dL. HDL is high, providing cardioprotective benefits.  The 10-year ASCVD risk score (Arnett DK, et al., 2019) is: 1.2%   Values used to calculate the score:     Age: 65  years     Clincally relevant sex: Female     Is Non-Hispanic African American: No     Diabetic: No     Tobacco smoker: No     Systolic Blood Pressure: 126 mmHg     Is BP treated: No     HDL Cholesterol: 78 mg/dL     Total Cholesterol: 237 mg/dL  Coronary Calcium Score of 0 on 08/23/22 FIB 4 score 1.25- advanced fibrosis excluded.  Cardiovascular risk score is 1.2%, below the threshold for statin therapy. She would like to avoid additional medication and statin therapy not indicated at this point, but would like to see improvement/no progression of changes with worsening LDL.  Potential impact of progesterone on cholesterol levels discussed. Consideration of micronized progesterone as an alternative.  No statin therapy recommended due to low cardiovascular risk and potential increased risk of type 2 diabetes with statin use. Continue to work on nutrition plan -decreasing simple carbohydrates, increasing lean proteins, decreasing saturated fats and cholesterol , avoiding trans fats and exercise as able to promote weight loss, improve lipids and decrease cardiovascular risks.  - Will repeat lipid panel early next year. - Will consider referral to lipid clinic if cholesterol levels remain elevated. - Discuss with provider about switching to micronized progesterone.  Insulin  resistance Insulin  levels are stable at 11.0, not yet at the goal of 5 or less. Last A1c was excellent at 5.1. No current use of metformin . Focus on lifestyle modifications to manage insulin  resistance. HOMA-IR 2.6- elevated, but mild elevation. She would like to avoid additional medications and did not tolerate metformin  well.  Will plan to repeat fasting labs early next year .  Continue working on nutrition plan to decrease simple carbohydrates, increase lean proteins and exercise to promote weight loss, improve glycemic control and prevent progression to Type 2 diabetes.  - Continue lifestyle modifications including diet  and exercise.  Vitamin D  deficiency Vitamin D  levels are well-managed at 55 ng/mL, within the goal range of 50-70 ng/mL. She has reduced cholecalciferol dosage from 5000 IU to 2000 IU daily due to muscle pain and adequate vitamin D  levels. Low vitamin D  levels can be associated with adiposity and may result in leptin resistance and weight gain. Also associated with fatigue.  Currently on vitamin D  supplementation without any adverse effects such as nausea, vomiting or muscle weakness.  - Continue cholecalciferol 2000 IU daily. - Monitor vitamin D  levels periodically to optimize supplementation  Menopausal symptoms Managed with estradiol patches and progesterone. Recent adjustment in estradiol dosage to 0.05/24hr mg .Progesterone 100 mg is continued for sleep support. Discussion on the impact of hormonal changes on cholesterol levels and sleep with provider. - Continue estradiol patch at 0.05 mg/24 hr. - Continue progesterone for sleep support. - Consider micronized progesterone as an alternative.  Poor sleep Managed with progesterone and estradiol patches and HRT has improved sleep significantly.  Discussion on alternative sleep aids such as magnesium citrate. - Continue progesterone and estradiol patches for sleep support and follow up on cholesterol panel early next year. - Consider magnesium citrate for sleep improvement.- Calm magnesium  General Health Maintenance Routine health maintenance is ongoing. B12 levels are well-managed at 946 pg/mL. Liver function tests are normal, and there is no evidence of advanced fibrosis or fatty liver disease. - Continue B12 injections monthly. - Monitor liver function tests regularly.  Vitals Temp: 98.1 F (36.7 C) BP: 126/75 Pulse Rate: 71 SpO2: 99 %   Anthropometric Measurements Height: 5' 7 (1.702 m) Weight: 226 lb (102.5 kg) BMI (Calculated): 35.39 Weight at Last Visit: 228 lbs Weight Lost Since Last Visit: 2 lbs Weight Gained Since  Last Visit: 0 Starting Weight: 228 lbs Total Weight Loss (lbs): 2 lb (0.907 kg) Peak Weight: 244 lbs   Body Composition  Body Fat %: 43.7 % Fat Mass (lbs): 98.8 lbs Muscle Mass (lbs): 120.8 lbs Total Body Water (lbs): 83.2 lbs Visceral Fat Rating : 12   Other Clinical Data Fasting: no Today's Visit #: 16     ASSESSMENT AND PLAN:  Diet: Kathlynn is currently in the action stage of change. As such, her goal is to continue with weight loss efforts. She has agreed to Category 1 Plan.  Exercise: Audi has been instructed to continue exercising as is for weight loss and overall health benefits.   Behavior Modification:  We discussed the following Behavioral Modification Strategies today: increasing lean protein intake, decreasing simple carbohydrates, increasing vegetables, increase H2O intake, increase high fiber foods, meal planning and cooking strategies, holiday eating strategies, avoiding temptations, and planning for success. We discussed various medication options to help Kaysa with her weight loss efforts and we both agreed to continue to work on nutritional and behavioral strategies to promote weight loss.  .  Return in about 7 weeks (around 04/28/2024).SABRA She was informed of the importance of frequent follow up visits to maximize her success with intensive lifestyle modifications for her multiple health conditions.  Attestation Statements:   Reviewed by clinician on day of visit: allergies, medications, problem list, medical history, surgical history, family history, social history, and previous encounter notes.   Time spent on visit including pre-visit chart review and post-visit care and charting was 43 minutes.    Iyonnah Ferrante, PA-C

## 2024-03-10 ENCOUNTER — Encounter (INDEPENDENT_AMBULATORY_CARE_PROVIDER_SITE_OTHER): Payer: Self-pay | Admitting: Physician Assistant

## 2024-03-10 ENCOUNTER — Ambulatory Visit (INDEPENDENT_AMBULATORY_CARE_PROVIDER_SITE_OTHER): Payer: Self-pay | Admitting: Physician Assistant

## 2024-03-10 VITALS — BP 126/75 | HR 71 | Temp 98.1°F | Ht 67.0 in | Wt 226.0 lb

## 2024-03-10 DIAGNOSIS — E559 Vitamin D deficiency, unspecified: Secondary | ICD-10-CM

## 2024-03-10 DIAGNOSIS — N951 Menopausal and female climacteric states: Secondary | ICD-10-CM

## 2024-03-10 DIAGNOSIS — E88819 Insulin resistance, unspecified: Secondary | ICD-10-CM

## 2024-03-10 DIAGNOSIS — E669 Obesity, unspecified: Secondary | ICD-10-CM

## 2024-03-10 DIAGNOSIS — Z6835 Body mass index (BMI) 35.0-35.9, adult: Secondary | ICD-10-CM

## 2024-03-10 DIAGNOSIS — Z72820 Sleep deprivation: Secondary | ICD-10-CM

## 2024-03-10 DIAGNOSIS — E7800 Pure hypercholesterolemia, unspecified: Secondary | ICD-10-CM

## 2024-03-10 DIAGNOSIS — R030 Elevated blood-pressure reading, without diagnosis of hypertension: Secondary | ICD-10-CM

## 2024-03-10 NOTE — Addendum Note (Signed)
 Addended by: DIERDRE ELOUISE PHLEGM on: 03/10/2024 07:44 PM   Modules accepted: Orders

## 2024-03-18 ENCOUNTER — Encounter: Payer: Self-pay | Admitting: Internal Medicine

## 2024-03-18 ENCOUNTER — Ambulatory Visit: Admitting: Internal Medicine

## 2024-03-18 VITALS — BP 128/80 | HR 67 | Temp 98.0°F | Ht 67.0 in | Wt 229.0 lb

## 2024-03-18 DIAGNOSIS — Z91030 Bee allergy status: Secondary | ICD-10-CM | POA: Insufficient documentation

## 2024-03-18 DIAGNOSIS — T6391XA Toxic effect of contact with unspecified venomous animal, accidental (unintentional), initial encounter: Secondary | ICD-10-CM | POA: Insufficient documentation

## 2024-03-18 DIAGNOSIS — E559 Vitamin D deficiency, unspecified: Secondary | ICD-10-CM

## 2024-03-18 DIAGNOSIS — J3081 Allergic rhinitis due to animal (cat) (dog) hair and dander: Secondary | ICD-10-CM | POA: Insufficient documentation

## 2024-03-18 DIAGNOSIS — I10 Essential (primary) hypertension: Secondary | ICD-10-CM

## 2024-03-18 DIAGNOSIS — E7800 Pure hypercholesterolemia, unspecified: Secondary | ICD-10-CM

## 2024-03-18 DIAGNOSIS — E538 Deficiency of other specified B group vitamins: Secondary | ICD-10-CM

## 2024-03-18 DIAGNOSIS — Z23 Encounter for immunization: Secondary | ICD-10-CM

## 2024-03-18 MED ORDER — CLOTRIMAZOLE-BETAMETHASONE 1-0.05 % EX CREA: 1.0000 | TOPICAL_CREAM | Freq: Every day | CUTANEOUS | 1 refills | Status: DC

## 2024-03-18 NOTE — Patient Instructions (Signed)
 You had the flu shot today  Please take all new medication as prescribed - the lotrisone cream  Please continue all other medications as before, including the Bee Venom Allergy shots  Please have the pharmacy call with any other refills you may need.  Please continue your efforts at being more active, low cholesterol diet, and weight control.  You are otherwise up to date with prevention measures today.  Please keep your appointments with your specialists as you may have planned

## 2024-03-18 NOTE — Progress Notes (Unsigned)
 Patient ID: Hannah Cain, female   DOB: 1971-03-20, 53 y.o.   MRN: [id number]        Chief Complaint: follow up rash, low vit d and b12, htn, hld       HPI:  Hannah Cain is a 53 y.o. female here overall doing ok, Pt denies chest pain, increased sob or doe, wheezing, orthopnea, PND, increased LE swelling, palpitations, dizziness or syncope.   Pt denies polydipsia, polyuria, or new focal neuro s/s.    Pt denies fever, wt loss, night sweats, loss of appetite, or other constitutional symptoms  Does have erythem circular area rash to elft breast tail area at the anterior axillary line.  No pain, fever, trauma.  No prior hx of same.  Lost 20 lbs since April 2024.  Due for flu shot      Wt Readings from Last 3 Encounters:  03/18/24 229 lb (103.9 kg)  03/10/24 226 lb (102.5 kg)  02/03/24 228 lb (103.4 kg)   BP Readings from Last 3 Encounters:  03/18/24 128/80  03/10/24 126/75  02/03/24 127/72         Past Medical History:  Diagnosis Date   ALLERGIC RHINITIS 03/17/2007   Anemia    Arthritis    Right knee   B12 deficiency    Back pain    Basal cell carcinoma (BCC) 06/10/2017   Constipation    GERD (gastroesophageal reflux disease) 2022   High blood pressure    only for 6 months( due to birth control)   HLD (hyperlipidemia) 06/10/2017   Joint pain    Palpitations 11/12/2017   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 She has never used smokeless tobacco. She reports that she does not drink alcohol and does not use drugs. family history includes Arthritis in her maternal grandmother; Breast cancer in her maternal grandmother; COPD in her maternal grandmother and mother; Cancer in her brother and maternal grandmother; Colon cancer (age  of onset: 26) in her brother; Colon polyps in her maternal grandmother; Heart failure in her maternal grandmother; Hyperlipidemia in her maternal grandmother; Hypertension in her maternal grandmother and mother; Hypothyroidism in her mother; Liver cancer in her brother; Obesity in her father; Stroke in her maternal grandmother; Thyroid  disease in her mother; Vitiligo in her mother. Allergies[1] Medications Ordered Prior to Encounter[2]      ROS:  All others reviewed and negative.  Objective        PE:  BP 128/80 (BP Location: Right Arm, Patient Position: Sitting, Cuff Size: Normal)   Pulse 67   Temp 98 F (36.7 C) (Oral)   Ht 5' 7 (1.702 m)   Wt 229 lb (103.9 kg)   LMP 08/04/2021   SpO2 98%   BMI 35.87 kg/m                 Constitutional: Pt appears in NAD               HENT: Head: NCAT.                Right Ear: External ear normal.  Left Ear: External ear normal.                Eyes: . Pupils are equal, round, and reactive to light. Conjunctivae and EOM are normal               Nose: without d/c or deformity               Neck: Neck supple. Gross normal ROM               Cardiovascular: Normal rate and regular rhythm.                 Pulmonary/Chest: Effort normal and breath sounds without rales or wheezing.                Abd:  Soft, NT, ND, + BS, no organomegaly               Neurological: Pt is alert. At baseline orientation, motor grossly intact               Skin: Skin is warm. Has 2 cm area left breast tail area erythem rash nontender, no swelling or central clearing or drainage, no other new lesions, LE edema - none               Psychiatric: Pt behavior is normal without agitation   Micro: none  Cardiac tracings I have personally interpreted today:  none  Pertinent Radiological findings (summarize): none   Lab Results  Component Value Date   WBC 5.8 01/31/2023   HGB 13.5 01/31/2023   HCT 41.9 01/31/2023   PLT 204 01/31/2023   GLUCOSE 94 02/03/2024    CHOL 237 (H) 02/03/2024   TRIG 70 02/03/2024   HDL 78 02/03/2024   LDLCALC 147 (H) 02/03/2024   ALT 26 02/03/2024   AST 25 02/03/2024   NA 139 02/03/2024   K 4.8 02/03/2024   CL 101 02/03/2024   CREATININE 0.85 02/03/2024   BUN 12 02/03/2024   CO2 22 02/03/2024   TSH 3.36 07/09/2023   HGBA1C 5.1 02/03/2024   Assessment/Plan:  Hannah Cain is a 53 y.o. White or Caucasian [1] female with  has a past medical history of ALLERGIC RHINITIS (03/17/2007), Anemia, Arthritis, B12 deficiency, Back pain, Basal cell carcinoma (BCC) (06/10/2017), Constipation, GERD (gastroesophageal reflux disease) (2022), High blood pressure, HLD (hyperlipidemia) (06/10/2017), Joint pain, Palpitations (11/12/2017), Symptomatic PVCs (06/11/2018), Toxic effect of venom (03/18/2024), URI (07/19/2009), Vitamin B 12 deficiency, and Vitamin D  deficiency.  Vitamin D  deficiency Last vitamin D  Lab Results  Component Value Date   VD25OH 55.1 02/03/2024   Stable, cont oral replacement   Hypertension BP Readings from Last 3 Encounters:  03/18/24 128/80  03/10/24 126/75  02/03/24 127/72   Stable, pt to continue medical treatment  - diet, wt control   Hypercholesteremia Lab Results  Component Value Date   LDLCALC 147 (H) 02/03/2024   Uncontrolled, for lower chol diet, declines statin for now   B12 deficiency Lab Results  Component Value Date   VITAMINB12 946 02/03/2024   Stable, cont oral replacement - b12 1000 mcg qd   Rash Mild nontender area c/w likely ringworm fungal rash - for lotrisone cr asd,  to f/u any worsening symptoms or concerns  Followup: Return if symptoms worsen or fail to improve.  Lynwood Rush, MD 03/19/2024 7:40 PM [facility]     [1]  Allergies Allergen Reactions  Bee Venom    Toradol  [Ketorolac  Tromethamine ] Other (See Comments)    Near syncope, diaphoresis, dizziness  [2]  Current Outpatient  Medications on File Prior to Visit  Medication Sig Dispense Refill   Biotin 1 MG CAPS Take by mouth.     Cholecalciferol (VITAMIN D3) 125 MCG (5000 UT) CAPS Take 1 capsule (5,000 Units total) by mouth daily. 30 capsule 0   cyanocobalamin  (VITAMIN B12) 1000 MCG/ML injection Inject 1 mL into the muscle.     EPINEPHrine  0.3 mg/0.3 mL IJ SOAJ injection Inject 0.3 mg into the muscle as needed for anaphylaxis. 1 each 2   estradiol (VIVELLE-DOT) 0.05 MG/24HR patch Place 1 patch onto the skin 2 (two) times a week.     estradiol (VIVELLE-DOT) 0.075 MG/24HR Place 1 patch onto the skin.     fexofenadine (ALLEGRA) 180 MG tablet Take 180 mg by mouth.     meloxicam  (MOBIC ) 15 MG tablet Take 1 tablet (15 mg total) by mouth daily as needed for pain. 30 tablet 0   Multiple Vitamin (MULTIVITAMIN) tablet Take 1 tablet by mouth daily.     Olopatadine HCl 0.6 % SOLN Place 2 sprays into the nose.     omega-3 acid ethyl esters (LOVAZA) 1 g capsule Take 2 g by mouth.     progesterone (PROMETRIUM) 100 MG capsule Take 100 mg by mouth daily.     RESTASIS 0.05 % ophthalmic emulsion 1 drop 2 (two) times daily.     No current facility-administered medications on file prior to visit.

## 2024-03-19 ENCOUNTER — Encounter: Payer: Self-pay | Admitting: Internal Medicine

## 2024-03-19 DIAGNOSIS — R21 Rash and other nonspecific skin eruption: Secondary | ICD-10-CM | POA: Insufficient documentation

## 2024-03-19 NOTE — Assessment & Plan Note (Signed)
 BP Readings from Last 3 Encounters:  03/18/24 128/80  03/10/24 126/75  02/03/24 127/72   Stable, pt to continue medical treatment  - diet, wt control

## 2024-03-19 NOTE — Assessment & Plan Note (Signed)
 Lab Results  Component Value Date   LDLCALC 147 (H) 02/03/2024   Uncontrolled, for lower chol diet, declines statin for now

## 2024-03-19 NOTE — Assessment & Plan Note (Signed)
 Mild nontender area c/w likely ringworm fungal rash - for lotrisone cr asd,  to f/u any worsening symptoms or concerns

## 2024-03-19 NOTE — Assessment & Plan Note (Signed)
 Last vitamin D  Lab Results  Component Value Date   VD25OH 55.1 02/03/2024   Stable, cont oral replacement

## 2024-03-19 NOTE — Assessment & Plan Note (Signed)
 Lab Results  Component Value Date   VITAMINB12 946 02/03/2024   Stable, cont oral replacement - b12 1000 mcg qd

## 2024-03-22 ENCOUNTER — Telehealth: Payer: Self-pay

## 2024-03-22 NOTE — Telephone Encounter (Signed)
 Copied from CRM #[id number]. Topic: Appointments - Appointment Scheduling >> Mar 22, 2024 10:33 AM Franky GRADE wrote: Patient/patient representative is calling to schedule an appointment. Refer to attachments for appointment information. Patient is calling as she was made aware of Dr.John's retirement and she would like to continue coming to the practice but only if Dr.Burns would be willing to accept her as a new patient.

## 2024-03-25 NOTE — Telephone Encounter (Signed)
 Unfortunately at this time I cannot accept any new patients.

## 2024-03-29 ENCOUNTER — Ambulatory Visit

## 2024-03-29 DIAGNOSIS — E538 Deficiency of other specified B group vitamins: Secondary | ICD-10-CM | POA: Diagnosis not present

## 2024-03-29 MED ORDER — CYANOCOBALAMIN 1000 MCG/ML IJ SOLN
1000.0000 ug | Freq: Once | INTRAMUSCULAR | Status: AC
  Administered 2024-03-29: 1000 ug via INTRAMUSCULAR

## 2024-03-29 NOTE — Progress Notes (Signed)
Pt was given B12 w/o any complications. 

## 2024-04-12 ENCOUNTER — Encounter: Payer: Self-pay | Admitting: Neurology

## 2024-04-12 ENCOUNTER — Ambulatory Visit: Admitting: Neurology

## 2024-04-12 VITALS — BP 126/80 | HR 71 | Ht 67.0 in | Wt 225.0 lb

## 2024-04-12 DIAGNOSIS — M5124 Other intervertebral disc displacement, thoracic region: Secondary | ICD-10-CM | POA: Diagnosis not present

## 2024-04-12 NOTE — Progress Notes (Signed)
 "   Follow-up Visit   Date: 04/12/2024    Hannah Cain MRN: [id number] DOB: 04-Mar-1971    Hannah Cain is a 54 y.o. right-handed Caucasian female returning to the clinic for follow-up of left leg numbness.  The patient was accompanied to the clinic by self.  IMPRESSION/PLAN: Assessment & Plan Left lower leg and foot numbness, resolved with PT.  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 NCS/EMG and ABI are normal.  Continued core strengthening and stretching essential to prevent recurrence.  Return to clinic as needed --------------------------------------------- History of present illness: She has numbness involving the left foot and lateral leg, which has been getting worse since April 2024.  There was no preceding injury.   Symptoms are constant in the lower leg and intermittent in the foot.  Symptoms are not worse with activity.  She denies low back pain or radicular leg pain. She has some weakness in the left leg.  She has completed PT with some benefit.     UPDATE 05/13/2023:  She is here for follow-up visit.  She continues to have numbness over the lateral lower leg which is constant.   She has pins and needles over the side of the left foot, which can occur once per day.  Tingling tends to start in the ankle and radiates up the leg. MRI lumbar spine and NCS/EMG has been normal. No associated weakness.   UPDATE 06/18/2023:  She reports less pins and needles sensation, which now occurs a few times over the past few weeks.  She continues to have numbness.  She is here to review MRI cervical spine and thoracic spine results.   UPDATE 10/07/2023:  She is here for 3 month follow-up visit.  She reports having marked improvement of pins and needles in the left leg with PT.  She now only has back pain with prolonged standing or laying on her side.  She does get muscle cramps in the left leg almost nightly.    UPDATE 04/12/2024:  She is here for follow-up visit.   Discussed the use of AI scribe software for clinical note transcription with the patient, who gave verbal consent to proceed.  History of Present Illness She has experienced significant improvement in her symptoms. She has participated in physical therapy and worked with a systems analyst. She has lost approximately 20 pounds since April and feels stronger overall. No numbness in her legs is reported, and she is determined to maintain her weight loss through diet and exercise.  Cramps are also infrequent.   She experiences lower back pain if she is on her feet for extended periods, such as spending an entire day out. However, most of the time she feels well.   Medications:  Current Outpatient Medications on File Prior to Visit  Medication Sig Dispense Refill   Biotin 1 MG CAPS Take by mouth.     Cholecalciferol (VITAMIN D3) 125 MCG (5000 UT) CAPS Take 1 capsule (5,000 Units total) by mouth daily. 30 capsule 0   clotrimazole -betamethasone  (LOTRISONE ) cream Apply 1 Application topically daily. 30 g 1   cyanocobalamin  (VITAMIN B12) 1000 MCG/ML injection Inject 1 mL into the muscle.     EPINEPHrine  0.3 mg/0.3 mL IJ SOAJ injection Inject 0.3 mg into the muscle as needed for anaphylaxis. 1 each 2   estradiol (VIVELLE-DOT) 0.05 MG/24HR patch Place 1 patch onto the skin 2 (two) times a week.     fexofenadine (ALLEGRA) 180 MG tablet Take  180 mg by mouth.     meloxicam  (MOBIC ) 15 MG tablet Take 1 tablet (15 mg total) by mouth daily as needed for pain. 30 tablet 0   Multiple Vitamin (MULTIVITAMIN) tablet Take 1 tablet by mouth daily.     Olopatadine HCl 0.6 % SOLN Place 2 sprays into the nose.     omega-3 acid ethyl esters (LOVAZA) 1 g capsule Take 2 g by mouth.     progesterone (PROMETRIUM) 100 MG capsule Take 100 mg by mouth daily.     RESTASIS 0.05 % ophthalmic emulsion 1 drop 2 (two) times daily.     estradiol (VIVELLE-DOT) 0.075 MG/24HR Place 1 patch onto the skin. (Patient not taking: Reported on  04/12/2024)     No current facility-administered medications on file prior to visit.    Allergies:  Allergies  Allergen Reactions   Bee Venom    Toradol  [Ketorolac  Tromethamine ] Other (See Comments)    Near syncope, diaphoresis, dizziness    Vital Signs:  BP 126/80   Pulse 71   Ht 5' 7 (1.702 m)   Wt 225 lb (102.1 kg)   LMP 08/04/2021   SpO2 99%   BMI 35.24 kg/m     Neurological Exam: MENTAL STATUS including orientation to time, place, person, recent and remote memory, attention span and concentration, language, and fund of knowledge is normal.  Speech is not dysarthric.  CRANIAL NERVES:  Pupils equal round and reactive to light.  Normal conjugate, extra-ocular eye movements in all directions of gaze.  No ptosis.  Face is symmetric.   MOTOR:  Motor strength is 5/5 in all extremities, including door dorsiflexion and toe extension.  No atrophy, fasciculations or abnormal movements.  No pronator drift.  Tone is normal.    MSRs:  Reflexes are 2+/4 throughout, except 3+/4 at the knees.  SENSORY:  Intact to vibration throughout.  COORDINATION/GAIT:   Gait narrow based and stable.   Data: NCS/EMG of the left leg 05/02/2023: This is a normal study of the left lower extremity.  In particular, there is no evidence of a peroneal mononeuropathy, lumbosacral radiculopathy, or large fiber sensorimotor polyneuropathy.   MRI lumbar spine 03/26/2023: T12-L1: No significant disc bulge. No neural foraminal stenosis. No central canal stenosis.   L1-L2: Mild broad-based disc bulge. Mild bilateral facet arthropathy. No foraminal or central canal stenosis.   L2-L3: Mild disc bulge. Mild bilateral facet arthropathy. No right foraminal stenosis. Mild left foraminal stenosis. No central canal stenosis.   L3-L4: Mild broad-based disc bulge. Moderate bilateral facet arthropathy. No central canal stenosis. Mild left foraminal stenosis. No right foraminal stenosis.   L4-L5: Mild disc bulge.  Mild bilateral facet arthropathy. No foraminal or central canal stenosis.   L5-S1: Mild disc bulge with a small central disc protrusion. Moderate bilateral facet arthropathy. Mild bilateral foraminal stenosis. No central canal stenosis.   IMPRESSION: 1. Mild lumbar spine spondylosis as described above. 2. No acute osseous injury of the lumbar spine.  MRI thoracic spine wo contrast 05/24/2023: Small central, left paracentral disc herniation T7-T8 flattening the left lateral aspect of the cord.   MRI cervical spine wo contrast 05/24/2023: Mild degenerative disc disease at C4-C5 and C5-C6 with small bulging discs without significant central canal or neural foraminal narrowing.  ABI 05/16/2023:  Normal     Thank you for allowing me to participate in patient's care.  If I can answer any additional questions, I would be pleased to do so.    Sincerely,  Charlean Carneal K. Tobie, DO   "

## 2024-04-27 NOTE — Progress Notes (Unsigned)
 "  SUBJECTIVE: Discussed the use of AI scribe software for clinical note transcription with the patient, who gave verbal consent to proceed.  Chief Complaint: Obesity  Interim History: She is down 9 lbs since her last visit.  Down 11 lbs overall TBW loss of ~ 4%   03/10/24 15:00 04/28/24 07:00   Body Fat % 43.7 % 41.8 %  Muscle Mass (lbs) 120.8 lbs 120.2 lbs  Fat Mass (lbs) 98.8 lbs 90.8 lbs  Total Body Water (lbs) 83.2 lbs 80.4 lbs  Visceral Fat Rating  12 11  Peak Weight 244 lbs 244 lb  Starting Date  09/04/2022  Starting Weight 228 lbs 228 lb     Hannah Cain is here to discuss her progress with her obesity treatment plan. She is on the Category 1 Plan and states she is following her eating plan approximately 60 % of the time. She states she is exercising strength training/treadmill 45 minutes 3-5 times per week. Hannah Cain is a 54 year old female who presents for follow-up of her obesity treatment plan.  She has a history of insulin  resistance, pure hypercholesterolemia, and vitamin D  and B12 deficiency. Her current medications include cholecalciferol 5000 units daily, vitamin B12 injections from another provider, a multivitamin daily, and Lovaza 2 grams daily for hyperlipidemia.  She adheres to a category one plan approximately sixty percent of the time, focusing on adequate protein intake, hydration, and meal regularity. She struggles with consistent sleep, often not achieving seven hours per night. Her exercise routine includes strength training and treadmill walking for 45 minutes, three to five days weekly.  She has lost nine pounds, primarily adipose tissue, since her last visit. She takes Winnie brand creatine, which she chose for its NSF approval and third-party verification.  She experiences sleep disturbances, waking at midnight or early morning, which she attributes partly to her daughter's large dog disrupting her sleep pattern. Despite returning the dog, her sleep has not  significantly improved.  She was briefly on an estradiol patch and metformin , both of which she discontinued due to worsening night sweats. She resumed a lower dosage of estradiol. She notes an increase in cholesterol and blood pressure after starting these medications.  She drinks plenty of water and has not craved sugary foods or drinks, though she occasionally consumes small amounts of candy. She has switched to unsweetened tea and has not consumed Dr. Nunzio, which she previously enjoyed.  Fasting labs next OV  OBJECTIVE: Visit Diagnoses: Problem List Items Addressed This Visit     Vitamin D  deficiency   Generalized obesity with inital BMI 36   Vitamin B 12 deficiency   Insulin  resistance - Primary   Poor sleep   Other Visit Diagnoses       Pure hypercholesterolemia         BMI 34.0-34.9,adult Current BMI 34.0         Generalized obesity Significant weight loss of 9 pounds, primarily adipose tissue, with improved body composition. Current regimen includes strength training and treadmill walking 3-5 days per week. Creatine supplementation may aid in muscle maintenance during weight loss. - Patient has a supportive family, including a daughter in medical school. She recently cared for her daughter's dog while she was on a medical mission trip. The patient engages in regular exercise, including strength training and treadmill walking. She is focused on improving her health and body composition. The patient is experiencing some sleep disturbances, possibly related to recent family activities and stress. She is considering  using magnesium supplements to aid sleep. The patient is planning a ski trip in February. - Continue current exercise regimen including strength training and treadmill walking. - Continue creatine supplementation - Winnie - Scheduled follow-up appointment in early March for fasting labs to monitor metabolic health.  Insulin  resistance Previously managed with  metformin , which was discontinued. Current metabolic health appears stable with weight loss and exercise regimen. Lab Results  Component Value Date   HGBA1C 5.1 02/03/2024   HGBA1C 5.3 07/09/2023   HGBA1C 5.6 01/31/2023   Lab Results  Component Value Date   LDLCALC 147 (H) 02/03/2024   CREATININE 0.85 02/03/2024   INSULIN   Date Value Ref Range Status  02/03/2024 11.0 2.6 - 24.9 uIU/mL Final  ]Continue working on nutrition plan to decrease simple carbohydrates, increase lean proteins and exercise to promote weight loss, improve glycemic control and prevent progression to Type 2 diabetes.  - Will recheck insulin  levels, A1c and CMET during follow-up visit in March.  Pure hypercholesterolemia Cholesterol levels previously elevated, possibly related to estradiol patch and metformin  use. Currently on Lovaza 2 grams daily.  Lab Results  Component Value Date   CHOL 237 (H) 02/03/2024   CHOL 192 07/09/2023   CHOL 183 01/31/2023   Lab Results  Component Value Date   HDL 78 02/03/2024   HDL 60.80 07/09/2023   HDL 63 01/31/2023   Lab Results  Component Value Date   LDLCALC 147 (H) 02/03/2024   LDLCALC 117 (H) 07/09/2023   LDLCALC 109 (H) 01/31/2023   Lab Results  Component Value Date   TRIG 70 02/03/2024   TRIG 71.0 07/09/2023   TRIG 57 01/31/2023   Lab Results  Component Value Date   CHOLHDL 3 07/09/2023   CHOLHDL 2.9 09/04/2022   CHOLHDL 3 07/03/2022   No results found for: LDLDIRECT The 10-year ASCVD risk score (Arnett DK, et al., 2019) is: 1.3%   Values used to calculate the score:     Age: 70 years     Clinically relevant sex: Female     Is Non-Hispanic African American: No     Diabetic: No     Tobacco smoker: No     Systolic Blood Pressure: 130 mmHg     Is BP treated: No     HDL Cholesterol: 78 mg/dL     Total Cholesterol: 237 mg/dL  Worsening LDL- source not clear- Started Estradiol in patch form -? Related. Continue to work on nutrition plan -decreasing  simple carbohydrates, increasing lean proteins, decreasing saturated fats and cholesterol , avoiding trans fats and exercise as able to promote weight loss, improve lipids and decrease cardiovascular risks. - Continue Lovaza 2 grams daily. - Will recheck cholesterol levels during follow-up visit in March.  Vitamin D  deficiency Managed with cholecalciferol 5000 units daily. No SE. Reports fairly good energy overall, but poor sleep.  Last vitamin D  Lab Results  Component Value Date   VD25OH 55.1 02/03/2024  Low vitamin D  levels can be associated with adiposity and may result in leptin resistance and weight gain. Also associated with fatigue.  Currently on vitamin D  supplementation without any adverse effects such as nausea, vomiting or muscle weakness.  - Continue cholecalciferol 5000 units daily. Recheck vitamin D  levels with labs in March  Vitamin B12 deficiency Managed with vitamin B12 injections from another provider. No SE. Reports fairly good energy overall, but poor sleep.  Lab Results  Component Value Date   VITAMINB12 946 02/03/2024  - Recheck B 12 level with  labs in March - Continue vitamin B12 injections as prescribed by another provider.  Poor sleep Recent onset of poor sleep, possibly related to post-holiday stress and environmental changes. No significant anxiety or stressors identified. - Consider magnesium supplementation, such as Calm magnesium, to aid sleep and manage constipation.  Vitals Temp: 98 F (36.7 C) BP: 130/73 Pulse Rate: 72 SpO2: 98 %   Anthropometric Measurements Height: 5' 7 (1.702 m) Weight: 217 lb (98.4 kg) BMI (Calculated): 33.98 Weight at Last Visit: 226 lb Weight Lost Since Last Visit: 9 lb Weight Gained Since Last Visit: 0 Starting Weight: 228 lb Total Weight Loss (lbs): 11 lb (4.99 kg) Peak Weight: 244 lb   Body Composition  Body Fat %: 41.8 % Fat Mass (lbs): 90.8 lbs Muscle Mass (lbs): 120.2 lbs Total Body Water (lbs): 80.4  lbs Visceral Fat Rating : 11   Other Clinical Data Fasting: Yes Labs: No Today's Visit #: 17 Starting Date: 09/04/22     ASSESSMENT AND PLAN:  Diet: Raveena is currently in the action stage of change. As such, her goal is to continue with weight loss efforts. She has agreed to Category 1 Plan.  Exercise: Yassmin has been instructed to continue exercising as is for weight loss and overall health benefits.   Behavior Modification:  We discussed the following Behavioral Modification Strategies today: increasing lean protein intake, decreasing simple carbohydrates, increasing vegetables, increase H2O intake, increase high fiber foods, no skipping meals, meal planning and cooking strategies, avoiding temptations, and planning for success. We discussed various medication options to help Gilbert with her weight loss efforts and we both agreed to continue to work on nutritional and behavioral strategies to promote weight loss.  .  Return in about 6 weeks (around 06/09/2024) for Fasting Lab.SABRA She was informed of the importance of frequent follow up visits to maximize her success with intensive lifestyle modifications for her multiple health conditions.  Attestation Statements:   Reviewed by clinician on day of visit: allergies, medications, problem list, medical history, surgical history, family history, social history, and previous encounter notes.   Time spent on visit including pre-visit chart review and post-visit care and charting was 30 minutes.    Derico Mitton, PA-C  "

## 2024-04-28 ENCOUNTER — Ambulatory Visit (INDEPENDENT_AMBULATORY_CARE_PROVIDER_SITE_OTHER): Admitting: Physician Assistant

## 2024-04-28 ENCOUNTER — Encounter (INDEPENDENT_AMBULATORY_CARE_PROVIDER_SITE_OTHER): Payer: Self-pay | Admitting: Physician Assistant

## 2024-04-28 VITALS — BP 130/73 | HR 72 | Temp 98.0°F | Ht 67.0 in | Wt 217.0 lb

## 2024-04-28 DIAGNOSIS — E559 Vitamin D deficiency, unspecified: Secondary | ICD-10-CM

## 2024-04-28 DIAGNOSIS — Z72820 Sleep deprivation: Secondary | ICD-10-CM

## 2024-04-28 DIAGNOSIS — E538 Deficiency of other specified B group vitamins: Secondary | ICD-10-CM | POA: Diagnosis not present

## 2024-04-28 DIAGNOSIS — E88819 Insulin resistance, unspecified: Secondary | ICD-10-CM

## 2024-04-28 DIAGNOSIS — Z6834 Body mass index (BMI) 34.0-34.9, adult: Secondary | ICD-10-CM

## 2024-04-28 DIAGNOSIS — E7800 Pure hypercholesterolemia, unspecified: Secondary | ICD-10-CM

## 2024-04-28 DIAGNOSIS — E669 Obesity, unspecified: Secondary | ICD-10-CM | POA: Diagnosis not present

## 2024-04-30 ENCOUNTER — Ambulatory Visit

## 2024-04-30 DIAGNOSIS — E538 Deficiency of other specified B group vitamins: Secondary | ICD-10-CM

## 2024-04-30 MED ORDER — CYANOCOBALAMIN 1000 MCG/ML IJ SOLN
1000.0000 ug | Freq: Once | INTRAMUSCULAR | Status: AC
  Administered 2024-04-30: 1000 ug via INTRAMUSCULAR

## 2024-04-30 NOTE — Progress Notes (Signed)
 Patient visits today for their b-12 injection. Patient informed of what they have received and tolerated the injection well. Patient notified to reach out to the office if needed

## 2024-06-03 ENCOUNTER — Ambulatory Visit

## 2024-06-07 ENCOUNTER — Ambulatory Visit (INDEPENDENT_AMBULATORY_CARE_PROVIDER_SITE_OTHER): Admitting: Physician Assistant

## 2024-06-09 ENCOUNTER — Ambulatory Visit

## 2024-07-06 ENCOUNTER — Other Ambulatory Visit

## 2024-07-15 ENCOUNTER — Encounter: Admitting: Internal Medicine

## 2024-10-21 ENCOUNTER — Ambulatory Visit: Admitting: Family Medicine
